# Patient Record
Sex: Female | Born: 1970 | Race: White | Hispanic: No | Marital: Married | Smoking: Former smoker
Health system: Southern US, Community
[De-identification: ages and names within clinical notes are randomized; demographics above are authoritative.]

## PROBLEM LIST (undated history)

## (undated) DIAGNOSIS — Z1371 Encounter for nonprocreative screening for genetic disease carrier status: Secondary | ICD-10-CM

## (undated) DIAGNOSIS — G4730 Sleep apnea, unspecified: Secondary | ICD-10-CM

## (undated) DIAGNOSIS — R51 Headache: Secondary | ICD-10-CM

## (undated) DIAGNOSIS — F329 Major depressive disorder, single episode, unspecified: Secondary | ICD-10-CM

## (undated) DIAGNOSIS — I10 Essential (primary) hypertension: Secondary | ICD-10-CM

## (undated) DIAGNOSIS — F32A Depression, unspecified: Secondary | ICD-10-CM

## (undated) DIAGNOSIS — D0500 Lobular carcinoma in situ of unspecified breast: Secondary | ICD-10-CM

## (undated) DIAGNOSIS — Z9189 Other specified personal risk factors, not elsewhere classified: Secondary | ICD-10-CM

## (undated) DIAGNOSIS — J00 Acute nasopharyngitis [common cold]: Secondary | ICD-10-CM

## (undated) DIAGNOSIS — F419 Anxiety disorder, unspecified: Secondary | ICD-10-CM

## (undated) DIAGNOSIS — N201 Calculus of ureter: Secondary | ICD-10-CM

## (undated) DIAGNOSIS — N200 Calculus of kidney: Secondary | ICD-10-CM

## (undated) DIAGNOSIS — Z87440 Personal history of urinary (tract) infections: Secondary | ICD-10-CM

## (undated) DIAGNOSIS — Z87442 Personal history of urinary calculi: Secondary | ICD-10-CM

## (undated) HISTORY — PX: URETERAL STENT PLACEMENT: SHX822

## (undated) HISTORY — DX: Major depressive disorder, single episode, unspecified: F32.9

## (undated) HISTORY — DX: Personal history of urinary (tract) infections: Z87.440

## (undated) HISTORY — DX: Headache: R51

## (undated) HISTORY — DX: Depression, unspecified: F32.A

## (undated) HISTORY — DX: Lobular carcinoma in situ of unspecified breast: D05.00

## (undated) HISTORY — DX: Calculus of kidney: N20.0

---
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
## 2003-10-08 DIAGNOSIS — Z87442 Personal history of urinary calculi: Secondary | ICD-10-CM | POA: Insufficient documentation

## 2005-03-25 ENCOUNTER — Encounter: Payer: Self-pay | Admitting: Family Medicine

## 2006-04-14 ENCOUNTER — Ambulatory Visit: Payer: Self-pay | Admitting: Family Medicine

## 2006-05-04 ENCOUNTER — Ambulatory Visit: Payer: Self-pay | Admitting: Internal Medicine

## 2006-05-25 ENCOUNTER — Ambulatory Visit: Payer: Self-pay | Admitting: Family Medicine

## 2006-06-02 ENCOUNTER — Ambulatory Visit: Payer: Self-pay | Admitting: Family Medicine

## 2006-08-26 ENCOUNTER — Ambulatory Visit: Payer: Self-pay | Admitting: Family Medicine

## 2007-04-09 ENCOUNTER — Ambulatory Visit: Payer: Self-pay | Admitting: Family Medicine

## 2007-08-26 HISTORY — PX: EXTRACORPOREAL SHOCK WAVE LITHOTRIPSY: SHX1557

## 2007-08-30 ENCOUNTER — Ambulatory Visit: Payer: Self-pay | Admitting: Family Medicine

## 2007-08-30 DIAGNOSIS — R209 Unspecified disturbances of skin sensation: Secondary | ICD-10-CM | POA: Insufficient documentation

## 2007-08-30 DIAGNOSIS — N644 Mastodynia: Secondary | ICD-10-CM | POA: Insufficient documentation

## 2007-08-30 LAB — CONVERTED CEMR LAB: Beta hcg, urine, semiquantitative: NEGATIVE

## 2007-08-31 LAB — CONVERTED CEMR LAB: hCG, Beta Chain, Quant, S: <0.5 milliintl units/mL

## 2007-10-06 ENCOUNTER — Encounter: Payer: Self-pay | Admitting: Family Medicine

## 2007-10-06 DIAGNOSIS — L501 Idiopathic urticaria: Secondary | ICD-10-CM | POA: Insufficient documentation

## 2007-10-06 DIAGNOSIS — G43009 Migraine without aura, not intractable, without status migrainosus: Secondary | ICD-10-CM | POA: Insufficient documentation

## 2007-10-06 DIAGNOSIS — E669 Obesity, unspecified: Secondary | ICD-10-CM | POA: Insufficient documentation

## 2007-10-06 DIAGNOSIS — F411 Generalized anxiety disorder: Secondary | ICD-10-CM | POA: Insufficient documentation

## 2007-10-06 DIAGNOSIS — F17200 Nicotine dependence, unspecified, uncomplicated: Secondary | ICD-10-CM | POA: Insufficient documentation

## 2007-10-06 DIAGNOSIS — IMO0002 Reserved for concepts with insufficient information to code with codable children: Secondary | ICD-10-CM | POA: Insufficient documentation

## 2007-10-06 DIAGNOSIS — F329 Major depressive disorder, single episode, unspecified: Secondary | ICD-10-CM | POA: Insufficient documentation

## 2007-10-06 DIAGNOSIS — F3289 Other specified depressive episodes: Secondary | ICD-10-CM | POA: Insufficient documentation

## 2007-10-11 ENCOUNTER — Ambulatory Visit: Payer: Self-pay | Admitting: Family Medicine

## 2007-10-27 ENCOUNTER — Ambulatory Visit: Payer: Self-pay | Admitting: Internal Medicine

## 2007-10-27 DIAGNOSIS — N390 Urinary tract infection, site not specified: Secondary | ICD-10-CM | POA: Insufficient documentation

## 2007-10-27 LAB — CONVERTED CEMR LAB
Bilirubin Urine: NEGATIVE
Nitrite: NEGATIVE
Protein, U semiquant: 30
Specific Gravity, Urine: >=1.03
pH: 6.5

## 2007-10-28 ENCOUNTER — Encounter: Payer: Self-pay | Admitting: Family Medicine

## 2007-11-05 ENCOUNTER — Ambulatory Visit: Payer: Self-pay | Admitting: Internal Medicine

## 2007-11-05 LAB — CONVERTED CEMR LAB
Bilirubin Urine: NEGATIVE
Nitrite: NEGATIVE
Specific Gravity, Urine: 1.015

## 2007-11-08 ENCOUNTER — Encounter (INDEPENDENT_AMBULATORY_CARE_PROVIDER_SITE_OTHER): Payer: Self-pay | Admitting: *Deleted

## 2007-11-10 ENCOUNTER — Ambulatory Visit: Payer: Self-pay | Admitting: Family Medicine

## 2007-11-10 DIAGNOSIS — N200 Calculus of kidney: Secondary | ICD-10-CM | POA: Insufficient documentation

## 2007-11-10 DIAGNOSIS — M542 Cervicalgia: Secondary | ICD-10-CM | POA: Insufficient documentation

## 2007-11-12 ENCOUNTER — Encounter: Payer: Self-pay | Admitting: Family Medicine

## 2007-11-22 ENCOUNTER — Ambulatory Visit: Payer: Self-pay | Admitting: Urology

## 2007-12-15 ENCOUNTER — Telehealth: Payer: Self-pay | Admitting: Family Medicine

## 2007-12-15 DIAGNOSIS — M25519 Pain in unspecified shoulder: Secondary | ICD-10-CM | POA: Insufficient documentation

## 2007-12-17 ENCOUNTER — Encounter: Payer: Self-pay | Admitting: Family Medicine

## 2007-12-30 ENCOUNTER — Ambulatory Visit: Payer: Self-pay | Admitting: Family Medicine

## 2008-01-31 ENCOUNTER — Ambulatory Visit: Payer: Self-pay | Admitting: Family Medicine

## 2008-06-13 ENCOUNTER — Ambulatory Visit: Payer: Self-pay | Admitting: Family Medicine

## 2008-06-13 DIAGNOSIS — R21 Rash and other nonspecific skin eruption: Secondary | ICD-10-CM | POA: Insufficient documentation

## 2008-10-06 ENCOUNTER — Ambulatory Visit: Payer: Self-pay | Admitting: Family Medicine

## 2008-10-06 DIAGNOSIS — R197 Diarrhea, unspecified: Secondary | ICD-10-CM | POA: Insufficient documentation

## 2008-10-06 DIAGNOSIS — R1084 Generalized abdominal pain: Secondary | ICD-10-CM | POA: Insufficient documentation

## 2008-10-07 ENCOUNTER — Encounter: Payer: Self-pay | Admitting: Family Medicine

## 2008-10-11 ENCOUNTER — Telehealth: Payer: Self-pay | Admitting: Family Medicine

## 2008-10-11 LAB — CONVERTED CEMR LAB
ALT: 17 units/L (ref 0–35)
AST: 16 units/L (ref 0–37)
Albumin: 3.9 g/dL (ref 3.5–5.2)
BUN: 10 mg/dL (ref 6–23)
Basophils Absolute: 0 10*3/uL (ref 0.0–0.1)
Basophils Relative: 0.2 % (ref 0.0–3.0)
CO2: 28 meq/L (ref 19–32)
Calcium: 9.6 mg/dL (ref 8.4–10.5)
Chloride: 106 meq/L (ref 96–112)
Creatinine, Ser: 0.8 mg/dL (ref 0.4–1.2)
Eosinophils Absolute: 0.1 10*3/uL (ref 0.0–0.7)
Eosinophils Relative: 0.8 % (ref 0.0–5.0)
Hemoglobin: 14.6 g/dL (ref 12.0–15.0)
Lymphocytes Relative: 23.5 % (ref 12.0–46.0)
MCHC: 34.4 g/dL (ref 30.0–36.0)
MCV: 90.1 fL (ref 78.0–100.0)
Neutro Abs: 5 10*3/uL (ref 1.4–7.7)
Neutrophils Relative %: 69.6 % (ref 43.0–77.0)
RBC: 4.72 M/uL (ref 3.87–5.11)
TSH: 1.19 microintl units/mL (ref 0.35–5.50)
WBC: 7.2 10*3/uL (ref 4.5–10.5)

## 2009-03-08 ENCOUNTER — Ambulatory Visit: Payer: Self-pay | Admitting: Family Medicine

## 2009-03-08 DIAGNOSIS — M545 Low back pain, unspecified: Secondary | ICD-10-CM | POA: Insufficient documentation

## 2009-03-08 DIAGNOSIS — R310 Gross hematuria: Secondary | ICD-10-CM | POA: Insufficient documentation

## 2009-03-08 LAB — CONVERTED CEMR LAB
Glucose, Urine, Semiquant: NEGATIVE
Nitrite: NEGATIVE
Specific Gravity, Urine: 1.015
WBC Urine, dipstick: NEGATIVE
pH: 6

## 2009-03-09 ENCOUNTER — Encounter: Payer: Self-pay | Admitting: Family Medicine

## 2009-04-26 ENCOUNTER — Ambulatory Visit: Payer: Self-pay | Admitting: Family Medicine

## 2009-04-26 DIAGNOSIS — J40 Bronchitis, not specified as acute or chronic: Secondary | ICD-10-CM | POA: Insufficient documentation

## 2009-05-02 ENCOUNTER — Telehealth (INDEPENDENT_AMBULATORY_CARE_PROVIDER_SITE_OTHER): Payer: Self-pay | Admitting: Internal Medicine

## 2009-05-03 ENCOUNTER — Ambulatory Visit: Payer: Self-pay | Admitting: Family Medicine

## 2009-05-03 ENCOUNTER — Encounter (INDEPENDENT_AMBULATORY_CARE_PROVIDER_SITE_OTHER): Payer: Self-pay | Admitting: Internal Medicine

## 2009-05-03 DIAGNOSIS — R062 Wheezing: Secondary | ICD-10-CM | POA: Insufficient documentation

## 2009-08-23 ENCOUNTER — Ambulatory Visit: Payer: Self-pay | Admitting: Family Medicine

## 2009-08-23 LAB — CONVERTED CEMR LAB
Urobilinogen, UA: 0.2
pH: 5

## 2009-08-24 ENCOUNTER — Emergency Department: Payer: Self-pay | Admitting: Unknown Physician Specialty

## 2009-08-30 ENCOUNTER — Ambulatory Visit: Payer: Self-pay | Admitting: Urology

## 2009-11-15 ENCOUNTER — Ambulatory Visit: Payer: Self-pay | Admitting: Family Medicine

## 2009-11-15 DIAGNOSIS — J069 Acute upper respiratory infection, unspecified: Secondary | ICD-10-CM | POA: Insufficient documentation

## 2010-04-02 ENCOUNTER — Ambulatory Visit: Payer: Self-pay | Admitting: Family Medicine

## 2010-09-06 ENCOUNTER — Ambulatory Visit
Admission: RE | Admit: 2010-09-06 | Discharge: 2010-09-06 | Payer: Self-pay | Source: Home / Self Care | Attending: Family Medicine | Admitting: Family Medicine

## 2010-09-10 ENCOUNTER — Ambulatory Visit: Admit: 2010-09-10 | Payer: Self-pay | Admitting: Family Medicine

## 2010-09-24 NOTE — Assessment & Plan Note (Signed)
Summary: low grade fever/ear and weak/dlo   Vital Signs:  Patient profile:   40 year old female Height:      62.5 inches Weight:      170 pounds BMI:     30.71 Temp:     98.8 degrees F oral Pulse rate:   92 / minute Pulse rhythm:   regular BP sitting:   122 / 78  (left arm) Cuff size:   regular  Vitals Entered By: Delilah Shan CMA Duncan Dull) (November 15, 2009 2:39 PM) CC: Low grade fever, ear, weak   History of Present Illness: 40 yo female here for URI symptoms. Started getting sore throat, runny nose, malaise and ear popping yesterday. Subjective fevers, no chills. No cough. No SOB, no wheezing. Taking sudafed OTC that helps.  Current Medications (verified): 1)  Bcp .... Once Daily  Allergies: 1)  ! Macrobid (Nitrofurantoin Monohyd Macro) 2)  Codeine Phosphate (Codeine Phosphate)  Review of Systems      See HPI General:  Complains of fever; denies chills. ENT:  Complains of nasal congestion, postnasal drainage, and sore throat; denies sinus pressure. CV:  Denies chest pain or discomfort. Resp:  Denies cough, shortness of breath, sputum productive, and wheezing.  Physical Exam  General:  Well-developed,well-nourished,in no acute distress; alert,appropriate and cooperative throughout examination Ears:  clear fluid B TM, nml external ear Nose:  mild injection, no discharge or pallor Mouth:  MMM mild throat injection Lungs:  Normal respiratory effort, chest expands symmetrically. Lungs are clear to auscultation, no crackles or wheezes. Heart:  Normal rate and regular rhythm. S1 and S2 normal without gallop, murmur, click, rub or other extra sounds. Cervical Nodes:  no cervical or supraclavicular lymphadenopathy  Psych:  Cognition and judgment appear intact. Alert and cooperative with normal attention span and concentration. No apparent delusions, illusions, hallucinations   Impression & Recommendations:  Problem # 1:  URI (ICD-465.9) Assessment New Early in course,  likely viral. Continue supportive care with sudafed and Ibuprofen. RTC if no improvement in 7- 10 days.  Complete Medication List: 1)  Bcp  .... Once daily  Current Allergies (reviewed today): ! MACROBID (NITROFURANTOIN MONOHYD MACRO) CODEINE PHOSPHATE (CODEINE PHOSPHATE)

## 2010-09-24 NOTE — Assessment & Plan Note (Signed)
Summary: CONGESTION,FEVER/CLE   Vital Signs:  Patient profile:   40 year old female Height:      62.5 inches Weight:      155.2 pounds BMI:     28.04 Temp:     98.9 degrees F oral Pulse rate:   92 / minute Pulse rhythm:   regular BP sitting:   130 / 90  (left arm) Cuff size:   regular  Vitals Entered By: Benny Lennert CMA Duncan Dull) (April 02, 2010 3:15 PM)  History of Present Illness: Chief complaint fever and congestion  Acute Visit History:      The patient complains of cough, earache, fever, headache, sinus problems, and sore throat.  These symptoms began 5 days ago.  She denies vomiting.  Other comments include: fatigue  nausea using pseudoephdrine.        Her highest temperature has been subjective/chills.        The character of the cough is described as productive.  There is no history of wheezing or shortness of breath associated with her cough.        Earache symptom: pressure B x 73month.        There is no history of recent exposure to strep associated with the sore throat.        She complains of sinus pressure and purulent drainage.             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 Once Daily 2)  Azithromycin 250 Mg Tabs (Azithromycin) .... 2 Tab By Mouth Daily X 1 Then 1 Tab By Mouth Daily  Void After 04/29/2010 Start If Not Improving After 48-72 Hours.  Allergies: 1)  ! Macrobid (Nitrofurantoin Monohyd Macro) 2)  Codeine Phosphate (Codeine Phosphate)  Past History:  Past medical, surgical, family and social histories (including risk factors) reviewed, and no changes noted (except as noted below).  Past Medical History: Reviewed history from 11/05/2007 and no changes required. 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 G1 P1 - C-section Had stent for kidney stone  Family History: Reviewed history from 10/06/2007 and no changes required. Father: Died 57, lung CA Mother: Alive 36, healthy Siblings: 1 brother Ovarian CA:  MGM Lymphoma:  MGF Lung CA:  PGF ETOH:  Uncles  Social History: Reviewed history from 10/06/2007 and no changes required. Current Smoker, 1/2 PPD Alcohol use-yes, seldom Drug use-no Regular exercise-no Marital Status: Married x 2 Children: 1 child (boy) 21 years old Occupation: Runner, broadcasting/film/video, Cabin crew  Ed., [employer] Nutrition:  3 meals, limited fruts and veggies, fast meals, occ. FF  Review of Systems General:  Denies fatigue and fever. CV:  Denies chest pain or discomfort. Resp:  Denies shortness of breath.  Physical Exam  General:  Well-developed,well-nourished,in no acute distress; alert,appropriate and cooperative throughout examination Head:  no maxillary sinus ttp Ears:  clear fluid B TMs Nose:  nasal discharge, no mucosal pallor.   Mouth:  MMM Neck:  no carotid bruit or thyromegaly no  cervical or supraclavicular lymphadenopathy  Lungs:  Normal respiratory effort, chest expands symmetrically. Lungs are clear to auscultation, no crackles or wheezes. Heart:  Normal rate and regular rhythm. S1 and S2 normal without gallop, murmur, click, rub or other extra sounds.   Impression & Recommendations:  Problem # 1:  URI (ICD-465.9)  Treat symptomatically..if not improving given smoking history...fill course of antibiotics.   Call if symptoms persist or worsen.   Complete Medication List: 1)  Bcp  .... Once daily 2)  Azithromycin 250 Mg Tabs (Azithromycin) .... 2 tab by mouth daily x 1 then 1 tab by mouth daily  void after 04/29/2010 start if not improving after 48-72 hours.  Patient Instructions: 1)  Stop decongestant 2)   Start guafenesin (mucinex DM) two times a day. 3)   Nasal saline irrigation 3-4 times a day. 4)   Ibuprofen as needed pain. 5)   If not improving in 48-72 hours may fill antibiotic. Prescriptions: AZITHROMYCIN 250 MG TABS (AZITHROMYCIN) 2 tab by mouth daily x 1 then 1 tab by mouth daily  VOID after 04/29/2010 Start if not improving after 48-72 hours.  #6 x 0   Entered and Authorized by:   Kerby Nora MD   Signed by:   Kerby Nora MD on 04/02/2010   Method used:   Print then Give to Patient   RxID:   [id number]   Current Allergies (reviewed today): ! MACROBID (NITROFURANTOIN MONOHYD MACRO) CODEINE PHOSPHATE (CODEINE PHOSPHATE)

## 2010-09-26 NOTE — Assessment & Plan Note (Signed)
Summary: RIGHT ARM PAIN X 2 WEEKS CYD   Vital Signs:  Patient profile:   40 year old female Height:      62.5 inches Weight:      172.25 pounds BMI:     31.11 Temp:     98.3 degrees F oral Pulse rate:   80 / minute Pulse rhythm:   regular BP sitting:   134 / 84  (left arm) Cuff size:   regular  Vitals Entered By: Delilah Shan CMA Beula Joyner Dull) (September 06, 2010 3:22 PM) CC: Right arm pain x 2 weeks   History of Present Illness: R arm pain.  Occ starts in neck and then radiates down.  Tingling in fingertips.  Going on for 2 weeks.  More sweating under the R arm.  R handed.  No fall, MVA, trauma.  Had been working out at [employer]  It started without any known trigger.  No L sided symptoms.  no FCNAV. no other symptoms.  Ibuprofen didn't help.    Allergies: 1)  ! 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 FF  Review of Systems       See HPI.  Otherwise negative.    Physical Exam  General:  no apparent distress normocephalic atraumatic tm wnl x2  mucous membranes moist op wnl neck supple w/o la or midline pain but R posterior paraspinal muscles at tender to palpation, also upper/lateral R trap is tender to palpation  normal range of motion in neck spurling neg no la in r axilla normal range of motion w/o cuff signs or weakness  on R shoulder distally nv intact.  dtrs wnl.  no decrease in strength in biceps, triceps, or distally in arm.  arm not tender to palpation and no skin changes.    Impression & Recommendations:  Problem # 1:  CERVICALGIA (ICD-723.1)  with radicular symptoms.  D/w patient [id number].  I would not image at this point- no indication for this. GI precaution for steroid burst and use muscle relaxer.  call back if not improved. She agrees.  Anatomy d/w patient.   Orders: Prescription Created Electronically [phone])  Her updated medication list for this problem includes:    Flexeril 10 Mg Tabs (Cyclobenzaprine hcl) .Marland Kitchen... 1/2 to 1 tab by mouth three times a day as needed for pain,  sedation caution.  Complete Medication List: 1)  Bcp  .... Once daily 2)  Prednisone (pak) 5 Mg Tabs (Prednisone) .Marland Kitchen.. 12 day dose pack, take as directed with food. 3)  Flexeril 10 Mg Tabs (Cyclobenzaprine hcl) .... 1/2 to 1 tab by mouth three times a day as needed for pain,  sedation caution.  Patient Instructions: 1)  Start the prednisone and flexeril.  Don't take ibuprofen or aleve with the prednisone.  Flexeril can make you drowsy.  Eat when you takt the prednisone.  Let us know if you aren't getting better.  Take care.  Prescriptions: FLEXERIL 10 MG TABS (CYCLOBENZAPRINE HCL) 1/2 to 1 tab by mouth  three times a day as needed for pain,  sedation caution.  #30 x 0   Entered and Authorized by:   Crawford Givens MD   Signed by:   Crawford Givens MD on 09/06/2010   Method used:   Electronically to        [id number] (retail)       [address]       [place], [place]  [address]       Ph: [phone] or [phone]       Fax: [phone]   RxID:   [id number] PREDNISONE (PAK) 5 MG TABS (PREDNISONE) 12 day dose pack, take as directed with food.  #1 pack x 0   Entered and Authorized by:   Crawford Givens MD   Signed by:   Crawford Givens MD on 09/06/2010   Method used:   Electronically to         [id number] (retail)       [address]       [place], [place]  [address]       Ph: [phone] or [phone]       Fax: [phone]   RxID:   [id number]    Orders Added: 1)  Est. Patient Level III [42706] 2)  Prescription Created Electronically [phone]    Current Allergies (reviewed today): ! MACROBID (NITROFURANTOIN MONOHYD MACRO) CODEINE PHOSPHATE (CODEINE PHOSPHATE)

## 2010-10-24 HISTORY — PX: INTRAUTERINE DEVICE INSERTION: SHX323

## 2011-01-10 NOTE — Assessment & Plan Note (Signed)
[facility]                             [facility] OFFICE NOTE   Ariana Weeks, Ariana Weeks                        MRN:          [id number]  DATE:04/14/2006                            DOB:          1971/07/17    NEW PATIENT HISTORY AND PHYSICAL:   CHIEF COMPLAINT:  [age]-year-old white female here to establish new  doctor.   HISTORY OF PRESENT ILLNESS:  Ariana Weeks states that she has been doing fairly  well over the past few years.  She states that she went through a lot of  stress approximately 2 years ago when she had her new baby and at the same  time, her father died.  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  She states that she has been treated several times in the past by her  gynecologist as well as her previous primary care doctor for depression.  She states that this demonstrates itself mainly with irritability at home  with her husband.  She denies any anhedonia, but does mention a lack of  motivation.  She does have some difficulty falling asleep at night secondary  to thinking about the worries of the day.  She denies any excessive guilt.  She did have 1 suicide attempt about 8-9 years ago, but denies any now.  She  does have decreased energy.  She denies any mania symptoms.  Medications she  has tried in the past that have not worked include Zoloft and several other  unknown medications.   PAST MEDICAL HISTORY:  1. Obesity.  2. Depression.  3. Anxiety.  4. Migraine.  5. Preeclampsia in 2005.  6. Hives, unclear cause.  7. Tobacco abuse.   HOSPITALIZATIONS/SURGERIES/PROCEDURES:  1. G1, P1, C-section.  2. Pap smear, August 2006, was within normal limits.   ALLERGIES:  CODEINE.   MEDICATIONS:  1. Claritin over the counter daily.  2. Ranitidine 150 mg daily.  3. Tri-Sprintec daily.  4. Imitrex 100 mg p.r.n. migraine.   PHYSICIANS:  1. Dr. Tresa Endo,  [facility], for headache.  2. Dr. Tiburcio Pea for Gynecology.   FAMILY HISTORY:  Father deceased at age 19 with lung cancer.  Mother alive  at age 26, healthy.  She has 1 brother who is healthy.  She states she has a  maternal grandmother who had ovarian cancer.  She had some uncles who have  alcoholism, a maternal grandfather with lymphoma and a paternal grandfather  with lung cancer.   SOCIAL HISTORY:  She smokes about a half pack per day, and she also uses  alcohol seldom, about 1 drink every 5-6 months and she has no history of IV  drug use.  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  She has been married for 2 years and has 1 female child,  who is also 49 years old.  She denies any regular exercise.  She eats 3 meals  a day but has limited fruits and vegetables.  She basically just  eats  microwave fast meals from the grocery store and occasional fast food.   REVIEW OF SYSTEMS:  She wears glasses.  She has headaches.  No problems with  hearing.  Occasional dyspnea.  No chest pain and no palpitations.  No  nausea, vomiting, diarrhea, constipation or rectal bleeding.  No  genitourinary frequency.  Her last menstrual period was 28 days ago and was  light.  She has no increased thirst.  She does have some concerns about  being overweight and would like to lose weight, but mainly has a problem  with motivation.   PHYSICAL EXAMINATION:  VITAL SIGNS:  Height 62-1/2 inches, weight 191  pounds, making BMI 37.  Blood pressure 122/80, pulse 80, temperature 98.4.  GENERAL:  Obese-appearing female in no apparent distress.  HEENT:  PERRLA.  Extraocular muscles intact.  Oropharynx clear.  Tympanic  membranes clear.  NECK:  No lymphadenopathy.  No thyromegaly.  CARDIOVASCULAR:  Regular rate and rhythm.  No murmurs, rubs, or gallops.  PULMONARY:  Clear to auscultation bilaterally.  No wheezes, rales or  rhonchi.  ABDOMEN:  Soft and nontender.  Normoactive bowel  sounds.  No  hepatosplenomegaly.  NEUROLOGIC:  Cranial nerves II-XII grossly intact.  Strength 5/5 in upper  and lower extremities.  Reflexes 2+.  PSYCHIATRIC:  Appropriate affect, but the patient does seem somewhat  irritable and frustrated; she denies suicidal or homicidal ideations.   ASSESSMENT AND PLAN:  1. Depression and anxiety, chronic:  We discussed this in detail today and      she feels that trying a different medication at this point would be      helpful.  Given her desire to lose weight, I will begin her on Effexor      XL 37.5 mg daily for 1 week, then go up to 75 mg daily.  She was given      samples as well as a coupon.  I also encouraged her to talk with Dr.      Luiz Blare, our psychologist here, about stress relief and relaxation.  We      can also talk about this at our next visit.  She will return to clinic      in 4-5 weeks to discuss whether the medication has assisted with her      irritability.  2. Obesity:  We discussed healthy eating and how to increase aerobic      exercise.  She states that she knows the things that she needs to do,      but given the fact that she has done Weight[employer]chers before as well gone      to a bariatric clinic and has previously lost weight, but      she is just feeling so unmotivated to it.  She would like to treat her      depression and anxiety first and then work on her weight.                                   Kerby Nora, MD   AB/MedQ  DD:  04/14/2006  DT:  04/15/2006  Job #:  [id number]

## 2011-04-10 ENCOUNTER — Ambulatory Visit (INDEPENDENT_AMBULATORY_CARE_PROVIDER_SITE_OTHER): Payer: BC Managed Care – PPO | Admitting: Family Medicine

## 2011-04-10 ENCOUNTER — Encounter: Payer: Self-pay | Admitting: Family Medicine

## 2011-04-10 DIAGNOSIS — R1084 Generalized abdominal pain: Secondary | ICD-10-CM

## 2011-04-10 MED ORDER — CIPROFLOXACIN HCL 500 MG PO TABS: 500.0000 mg | ORAL_TABLET | Freq: Two times a day (BID) | ORAL | Status: AC

## 2011-04-10 NOTE — Progress Notes (Signed)
  Subjective:    Patient ID: Ariana Weeks, female    DOB: 01/18/1971, 40 y.o.   MRN: [id number]  HPI Pt of Dr Daphine Deutscher here as acute appt for "her stomach killing her" since yesterday about 530PM and hasn't slept due to feeeling like she needs to have her thighs against her abd to help the pain. She had supper and this AM had Pop Tart. Last night night had chicken sandwich from [employer]. She had Roast Beef sandwich and pudding for lunch. She had brfst with Pop Tart yesterday AM with no pain at that time. She left school last night about 530PM  And the pain started then. She has had some nausea, no vomitting. She has had mild chill last night, no fever and no chills otherwise. 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 BMs are orangish brown that are loose, last one last night at 1AM with effort, another again at 530, none since. She does not feel constipated. She feels like she needs to throw up. She does not usually vomit. She says lots of kids have complained of abd pain but doesn't think any have been diagnosed even with AGE. Her abd usually is fairly normal. She was told in the past she could have irritable bowel. She has had appetite but none right now. Again she feels like if she could vomit things would improve. She is not nauseated.    Review of Systems  Gastrointestinal: Negative for vomiting, constipation, blood in stool, abdominal distention, anal bleeding and rectal pain.  Genitourinary: Negative for dysuria, frequency, hematuria, flank pain and difficulty urinating.       Has had kidney stone in the past.       Objective:   Physical Exam  Constitutional: She appears well-developed and well-nourished. No distress.  HENT:  Head: Normocephalic and atraumatic.  Right Ear: External ear normal.  Left Ear: External ear normal.  Nose: Nose normal.  Mouth/Throat: Oropharynx is clear and moist. No oropharyngeal exudate.  Eyes:  Conjunctivae and EOM are normal. Pupils are equal, round, and reactive to light.  Neck: Normal range of motion. Neck supple. No thyromegaly present.  Cardiovascular: Normal rate, regular rhythm and normal heart sounds.   Pulmonary/Chest: Effort normal and breath sounds normal. She has no wheezes. She has no rales.  Abdominal: Soft. Bowel sounds are normal. She exhibits no distension and no mass. There is tenderness (LLQ>RLQ). There is no rebound and no guarding.  Lymphadenopathy:    She has no cervical adenopathy.  Skin: She is not diaphoretic.          Assessment & Plan:

## 2011-04-10 NOTE — Patient Instructions (Signed)
Call tomorrow with report of situation, esp if sxs worsen.

## 2011-04-10 NOTE — Assessment & Plan Note (Signed)
Pain in LLQ>RLQ with loose stool and nausea in waves. Presume she has AGE and will try Cipro 500mg  bid for one week. Call if sxs worsen. If tries to make herself throw up, hold Cipro until after this event, then take if sxs continue or can't induce vomiting.

## 2012-02-16 ENCOUNTER — Telehealth: Payer: Self-pay | Admitting: Family Medicine

## 2012-02-16 NOTE — Telephone Encounter (Signed)
Patient advised.

## 2012-02-16 NOTE — Telephone Encounter (Signed)
aller: Tifini/Mother; PCP: Kerby Nora E.; CB#: [phone]; ; ; Call regarding Blurry Vision and Severe Headaches;  Pt called for an appt and was transferred to the nurse. Pt has had frequent h/a's. Pt was referred to a neurologist 3 years ago - there were no results/pt has not been back. Pt is calling because this past w/e they were at the beach and the pt developed a severe h/a on Thursday  which was severe - she has maintained a h/a since then. This am it is very painful. Pt has taken Naproxen 500mg  this am at 9am;  Ibuprofen 800mg  at 07:00 this am . Pt states she has a h/a 4 out of 7 days that is her normal baseline. but over the past 2 weeks they have worsened. Pt is having blurry vision intermittently this am. This gives an ED disposition. OFFICE NOTE SENT TO SEE IF OFFICE WANTS TO SEND PT TO ED OR HAVE HER COME TO OFFICE. PLEASE CALL PT BACK.

## 2012-02-16 NOTE — Telephone Encounter (Signed)
In a patient with migraine, can see Dr. Leonard Schwartz tomorrow - will set up appt. Acute on chronic migraine

## 2012-02-16 NOTE — Telephone Encounter (Signed)
excedrin migraine reasonable to try  Sometimes coffee/caffeine will help, too

## 2012-02-16 NOTE — Telephone Encounter (Signed)
Patient says that she can not some in any other days this week. Wants to know if there is anything else she can try over the counter?

## 2012-05-04 ENCOUNTER — Encounter: Payer: Self-pay | Admitting: Family Medicine

## 2012-05-04 ENCOUNTER — Ambulatory Visit (INDEPENDENT_AMBULATORY_CARE_PROVIDER_SITE_OTHER)
Admission: RE | Admit: 2012-05-04 | Discharge: 2012-05-04 | Disposition: A | Discharge status: Home / Self Care | Payer: BC Managed Care – PPO | Source: Ambulatory Visit | Attending: Family Medicine | Admitting: Family Medicine

## 2012-05-04 ENCOUNTER — Telehealth: Payer: Self-pay | Admitting: Family Medicine

## 2012-05-04 ENCOUNTER — Ambulatory Visit (INDEPENDENT_AMBULATORY_CARE_PROVIDER_SITE_OTHER): Payer: BC Managed Care – PPO | Admitting: Family Medicine

## 2012-05-04 VITALS — BP 130/82 | HR 109 | Temp 98.6°F | Resp 17 | Wt 173.5 lb

## 2012-05-04 DIAGNOSIS — M545 Low back pain, unspecified: Secondary | ICD-10-CM

## 2012-05-04 LAB — POCT URINALYSIS DIPSTICK
Glucose, UA: NEGATIVE
Nitrite, UA: NEGATIVE
Urobilinogen, UA: 0.2

## 2012-05-04 MED ORDER — TAMSULOSIN HCL 0.4 MG PO CAPS: 0.4000 mg | ORAL_CAPSULE | Freq: Every day | ORAL | Status: DC

## 2012-05-04 MED ORDER — HYDROCODONE-ACETAMINOPHEN 5-500 MG PO TABS: 1.0000 | ORAL_TABLET | Freq: Three times a day (TID) | ORAL | Status: AC | PRN

## 2012-05-04 NOTE — Telephone Encounter (Signed)
Notify pt: Left hydroureternephrosis due to obstruction by 3.4 mm stone in the mid left ureter. Pain was more on right side  But stone appears to be obstructing.  She should be able to pass this on her own, she can let urologist know the results.. Strain urine, push fluids, start flomax and we can call in vicodin for pain.  If pain not improving in next few days.. Go to urologist to be seen.

## 2012-05-04 NOTE — Assessment & Plan Note (Signed)
Along with hematuria. Concern for stones. Will send for  CT non contrast to eval.  if no stones... Possibly MSK strain vs constipation.

## 2012-05-04 NOTE — Telephone Encounter (Signed)
Pt notified. Would like Vicodin and Flomax called in. Thanks.

## 2012-05-04 NOTE — Patient Instructions (Addendum)
Stop at front desk to schedule CT abd and pelvis. Try heat, gentle stretching and ibuprofen 800 mg three times a day for pain.

## 2012-05-04 NOTE — Progress Notes (Signed)
  Subjective:    Patient ID: Ariana Weeks, female    DOB: [birth year], 41 y.o.   MRN: [id number]  HPI  41 year old female presents with  Right low back pain radiates to right anterior abdomen x 5 days. She tried to call her uro but they cannot see her for months.   Saw GYN for low back pain.. On UA there, blood in urine: Started on cipro x 7 days. Feels similar to kidney stones she has had in the past. No dysuria.  She is on day 6 with no improvement of symptoms, Tramadol not helping with pain Some N, no V.  Some abdominal bloating.. No BM in last 4 days.  She does have mirena placed in /2013.  No low back injury. No weakness in leg numbness.  She has history of nephrolithiasis multiple times, hx of needing lithotripsy. Last kidney stones 2-3 years ago. Had some stones that had to be left.   Review of Systems  Constitutional: Negative for fever and fatigue.  HENT: Negative for ear pain.   Eyes: Negative for pain.  Respiratory: Negative for chest tightness and shortness of breath.   Cardiovascular: Negative for chest pain, palpitations and leg swelling.  Gastrointestinal: Negative for abdominal pain.  Genitourinary: Negative for dysuria.       Objective:   Physical Exam  Constitutional: She appears well-developed and well-nourished.  Eyes: Conjunctivae are normal. Pupils are equal, round, and reactive to light.  Neck: Normal range of motion. Neck supple. No thyromegaly present.  Cardiovascular: Normal rate and regular rhythm.   No murmur heard. Pulmonary/Chest: Effort normal and breath sounds normal.  Abdominal: Soft. Bowel sounds are normal. She exhibits no distension and no mass. There is tenderness. There is no rebound and no guarding.       No CVA tenderness, but pain in right low back  No vertebral ttp, neg SLR.          Assessment & Plan:

## 2012-05-06 NOTE — Telephone Encounter (Signed)
Pt called status of vicodin; Lisa at [facility] said did not receive call in for Vicodin. I gave Vicodin rx to [place] and pt notified.

## 2012-05-07 ENCOUNTER — Telehealth: Payer: Self-pay | Admitting: Family Medicine

## 2012-05-07 DIAGNOSIS — N200 Calculus of kidney: Secondary | ICD-10-CM

## 2012-05-07 NOTE — Telephone Encounter (Signed)
Pt has a kidney stone and still has not passed it. She was wondering if she could get a referral to a urologist for this ??

## 2012-05-18 ENCOUNTER — Other Ambulatory Visit: Payer: Self-pay | Admitting: Urology

## 2012-05-19 ENCOUNTER — Telehealth: Payer: Self-pay | Admitting: Family Medicine

## 2012-05-19 NOTE — Telephone Encounter (Signed)
Caller: [name]/Patient; Patient Name: Ariana Weeks; PCP: Kerby Nora [facility]); Best Callback Phone Number: [phone]; Reason for call: Cough/Congestion.  Onset: 05/18/12.  Afebrile.  Reports nasal congestion with green nasal mucus and green productive cough.  Also generalized headache with pain rated 5 out of 10. Pain relieved 05/18/12 with use of Ibuprofen.  Scheduled for urethroscopy to remove kidney stones 05/26/2012.  LMP 8/end/13.  Birth control/Mirena. May use Guaifenesin prn;hydrate/humidify to loosen mucus. Advised to see MD within 24 hours for productive cough with colored sputum per Upper Respiratory Infection guideline. Declined appointment now; will try otc meds and comfort measures suggested and call if symptoms worsen.

## 2012-05-25 ENCOUNTER — Encounter (HOSPITAL_BASED_OUTPATIENT_CLINIC_OR_DEPARTMENT_OTHER): Payer: Self-pay | Admitting: *Deleted

## 2012-05-25 NOTE — Progress Notes (Signed)
NPO AFTER MN. ARRIVES AT 0715. NEEDS HG AND URINE PREG. MAY TAKE VICODINE IF NEEDED W/ SIPS OF WATER AM OF SURG.

## 2012-05-26 ENCOUNTER — Encounter (HOSPITAL_BASED_OUTPATIENT_CLINIC_OR_DEPARTMENT_OTHER)
Admission: RE | Disposition: A | Discharge status: Home / Self Care | Payer: Self-pay | Source: Ambulatory Visit | Attending: Urology

## 2012-05-26 ENCOUNTER — Encounter (HOSPITAL_BASED_OUTPATIENT_CLINIC_OR_DEPARTMENT_OTHER): Payer: Self-pay | Admitting: *Deleted

## 2012-05-26 ENCOUNTER — Ambulatory Visit (HOSPITAL_BASED_OUTPATIENT_CLINIC_OR_DEPARTMENT_OTHER)
Admission: RE | Admit: 2012-05-26 | Discharge: 2012-05-26 | Disposition: A | Discharge status: Home / Self Care | Payer: BC Managed Care – PPO | Source: Ambulatory Visit | Attending: Urology | Admitting: Urology

## 2012-05-26 ENCOUNTER — Ambulatory Visit (HOSPITAL_BASED_OUTPATIENT_CLINIC_OR_DEPARTMENT_OTHER): Payer: BC Managed Care – PPO | Admitting: Anesthesiology

## 2012-05-26 ENCOUNTER — Encounter (HOSPITAL_BASED_OUTPATIENT_CLINIC_OR_DEPARTMENT_OTHER): Payer: Self-pay | Admitting: Anesthesiology

## 2012-05-26 DIAGNOSIS — M545 Low back pain, unspecified: Secondary | ICD-10-CM | POA: Insufficient documentation

## 2012-05-26 DIAGNOSIS — R1032 Left lower quadrant pain: Secondary | ICD-10-CM | POA: Insufficient documentation

## 2012-05-26 HISTORY — DX: Headache: R51

## 2012-05-26 HISTORY — DX: Calculus of ureter: N20.1

## 2012-05-26 HISTORY — DX: Personal history of urinary calculi: Z87.442

## 2012-05-26 HISTORY — DX: Acute nasopharyngitis (common cold): J00

## 2012-05-26 LAB — POCT PREGNANCY, URINE: Preg Test, Ur: NEGATIVE

## 2012-05-26 LAB — POCT HEMOGLOBIN-HEMACUE: Hemoglobin: 14.1 g/dL (ref 12.0–15.0)

## 2012-05-26 SURGERY — CYSTOURETEROSCOPY, WITH RETROGRADE PYELOGRAM AND STENT INSERTION
Anesthesia: General | Site: Ureter | Laterality: Left | Wound class: Clean Contaminated

## 2012-05-26 MED ORDER — CEFAZOLIN SODIUM-DEXTROSE 2-3 GM-% IV SOLR
INTRAVENOUS | Status: DC | PRN
  Administered 2012-05-26: 2 g via INTRAVENOUS

## 2012-05-26 MED ORDER — SODIUM CHLORIDE 0.9 % IR SOLN
Status: DC | PRN
  Administered 2012-05-26: 6000 mL via INTRAVESICAL

## 2012-05-26 MED ORDER — LIDOCAINE HCL (CARDIAC) 20 MG/ML IV SOLN
INTRAVENOUS | Status: DC | PRN
  Administered 2012-05-26: 100 mg via INTRAVENOUS

## 2012-05-26 MED ORDER — FENTANYL CITRATE 0.05 MG/ML IJ SOLN
INTRAMUSCULAR | Status: DC | PRN
  Administered 2012-05-26: 25 ug via INTRAVENOUS
  Administered 2012-05-26: 100 ug via INTRAVENOUS
  Administered 2012-05-26: 50 ug via INTRAVENOUS
  Administered 2012-05-26: 25 ug via INTRAVENOUS

## 2012-05-26 MED ORDER — MEPERIDINE HCL 25 MG/ML IJ SOLN: 6.2500 mg | INTRAMUSCULAR | Status: DC | PRN

## 2012-05-26 MED ORDER — CEFAZOLIN SODIUM-DEXTROSE 2-3 GM-% IV SOLR: 2.0000 g | INTRAVENOUS | Status: DC

## 2012-05-26 MED ORDER — ONDANSETRON HCL 4 MG/2ML IJ SOLN
INTRAMUSCULAR | Status: DC | PRN
  Administered 2012-05-26: 4 mg via INTRAVENOUS

## 2012-05-26 MED ORDER — DEXAMETHASONE SODIUM PHOSPHATE 4 MG/ML IJ SOLN
INTRAMUSCULAR | Status: DC | PRN
  Administered 2012-05-26: 10 mg via INTRAVENOUS

## 2012-05-26 MED ORDER — HYDROMORPHONE HCL PF 1 MG/ML IJ SOLN: 0.2500 mg | INTRAMUSCULAR | Status: DC | PRN

## 2012-05-26 MED ORDER — LACTATED RINGERS IV SOLN
INTRAVENOUS | Status: DC
  Administered 2012-05-26: 08:00:00 via INTRAVENOUS

## 2012-05-26 MED ORDER — ACETAMINOPHEN 10 MG/ML IV SOLN: 1000.0000 mg | Freq: Once | INTRAVENOUS | Status: DC | PRN

## 2012-05-26 MED ORDER — PROPOFOL 10 MG/ML IV BOLUS
INTRAVENOUS | Status: DC | PRN
  Administered 2012-05-26: 200 mg via INTRAVENOUS

## 2012-05-26 MED ORDER — OXYCODONE HCL 5 MG/5ML PO SOLN: 5.0000 mg | Freq: Once | ORAL | Status: DC | PRN

## 2012-05-26 MED ORDER — LIDOCAINE HCL 2 % EX GEL
CUTANEOUS | Status: DC | PRN
  Administered 2012-05-26: 1

## 2012-05-26 MED ORDER — OXYCODONE HCL 5 MG PO TABS: 5.0000 mg | ORAL_TABLET | Freq: Once | ORAL | Status: DC | PRN

## 2012-05-26 MED ORDER — IOHEXOL 350 MG/ML SOLN
INTRAVENOUS | Status: DC | PRN
  Administered 2012-05-26: 2 mL via INTRAVENOUS

## 2012-05-26 MED ORDER — MIDAZOLAM HCL 5 MG/5ML IJ SOLN
INTRAMUSCULAR | Status: DC | PRN
  Administered 2012-05-26: 2 mg via INTRAVENOUS

## 2012-05-26 MED ORDER — CEFAZOLIN SODIUM 1-5 GM-% IV SOLN: 1.0000 g | INTRAVENOUS | Status: DC

## 2012-05-26 MED ORDER — LACTATED RINGERS IV SOLN
INTRAVENOUS | Status: DC | PRN
  Administered 2012-05-26: 08:00:00 via INTRAVENOUS

## 2012-05-26 MED ORDER — KETOROLAC TROMETHAMINE 30 MG/ML IJ SOLN
INTRAMUSCULAR | Status: DC | PRN
  Administered 2012-05-26: 30 mg via INTRAVENOUS

## 2012-05-26 MED ORDER — PROMETHAZINE HCL 25 MG/ML IJ SOLN: 6.2500 mg | INTRAMUSCULAR | Status: DC | PRN

## 2012-05-26 SURGICAL SUPPLY — 37 items
ADAPTER CATH URET PLST 4-6FR (CATHETERS) IMPLANT
ADPR CATH URET STRL DISP 4-6FR (CATHETERS)
APL SKNCLS STERI-STRIP NONHPOA (GAUZE/BANDAGES/DRESSINGS)
BAG DRAIN URO-CYSTO SKYTR STRL (DRAIN) ×3 IMPLANT
BAG DRN UROCATH (DRAIN) ×2
BASKET LASER NITINOL 1.9FR (BASKET) IMPLANT
BASKET STNLS GEMINI 4WIRE 3FR (BASKET) IMPLANT
BASKET ZERO TIP NITINOL 2.4FR (BASKET) ×3 IMPLANT
BENZOIN TINCTURE PRP APPL 2/3 (GAUZE/BANDAGES/DRESSINGS) IMPLANT
BSKT STON RTRVL 120 1.9FR (BASKET)
BSKT STON RTRVL GEM 120X11 3FR (BASKET)
BSKT STON RTRVL ZERO TP 2.4FR (BASKET) ×2
CANISTER SUCT LVC 12 LTR MEDI- (MISCELLANEOUS) ×2 IMPLANT
CATH INTERMIT  6FR 70CM (CATHETERS) IMPLANT
CATH URET 5FR 28IN CONE TIP (BALLOONS)
CATH URET 5FR 28IN OPEN ENDED (CATHETERS) IMPLANT
CATH URET 5FR 70CM CONE TIP (BALLOONS) IMPLANT
CLOTH BEACON ORANGE TIMEOUT ST (SAFETY) ×3 IMPLANT
DRAPE CAMERA CLOSED 9X96 (DRAPES) ×3 IMPLANT
DRSG TEGADERM 2-3/8X2-3/4 SM (GAUZE/BANDAGES/DRESSINGS) IMPLANT
GLOVE BIO SURGEON STRL SZ7.5 (GLOVE) ×3 IMPLANT
GLOVE INDICATOR 6.5 STRL GRN (GLOVE) ×4 IMPLANT
GOWN STRL REIN XL XLG (GOWN DISPOSABLE) ×3 IMPLANT
GUIDEWIRE 0.038 PTFE COATED (WIRE) IMPLANT
GUIDEWIRE ANG ZIPWIRE 038X150 (WIRE) IMPLANT
GUIDEWIRE STR DUAL SENSOR (WIRE) IMPLANT
IV NS IRRIG 3000ML ARTHROMATIC (IV SOLUTION) ×6 IMPLANT
KIT BALLIN UROMAX 15FX10 (LABEL) IMPLANT
KIT BALLN UROMAX 15FX4 (MISCELLANEOUS) IMPLANT
KIT BALLN UROMAX 26 75X4 (MISCELLANEOUS)
LASER FIBER DISP (UROLOGICAL SUPPLIES) IMPLANT
LASER FIBER DISP 1000U (UROLOGICAL SUPPLIES) IMPLANT
NS IRRIG 500ML POUR BTL (IV SOLUTION) IMPLANT
PACK CYSTOSCOPY (CUSTOM PROCEDURE TRAY) ×3 IMPLANT
SET HIGH PRES BAL DIL (LABEL)
SHEATH ACCESS URETERAL 38CM (SHEATH) IMPLANT
SHEATH ACCESS URETERAL 54CM (SHEATH) IMPLANT

## 2012-05-26 NOTE — H&P (Signed)
Reason For Visit  Acute evaluation of LLQ abdominal and flank pain   History of Present Illness     41 y/o white female with known left midureteral calculus presents for acute evaluation of left flank pain into the LLQ abdomen.  She was seen in the office 05/11/12 with c/o right low back pain.  She had a recent CTU which showed a left midureteral stone as well as multiple bilateral renal calculi.  Her stone was not felt to be contributing to her right low back pain at that time.  She had a KUB 05/11/12 which confirmed presence of previously noted left ureteral stone.  She has been taking Flomax qhs for MET for the past 2-3 weeks.  She has not been straining her urine but has not seen an obvious stone pass.  She developed LLQ abdominal and left flank pain Wed. 05/12/12 which has persisted intermittently.  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  The vicodin gives mild pain relief.  She denies gross hematuria, dysuria, fever or chills.  She does report associated increased frequency and urgency with frequent, small volume voids.   Past Medical History Problems  1. History of  Depression 311  Surgical History Problems  1. History of  Cesarean Section  Current Meds 1. Flomax 0.4 MG Oral Capsule; Therapy: (Recorded:17Sep2013) to 2. Hydrocodone-Acetaminophen TABS; Therapy: (Recorded:17Sep2013) to  Allergies Medication  1. No Known Drug Allergies  Family History Problems  1. Paternal history of  Death In The Family Father 80yrs, Lung ca 2. Maternal history of  Family Health Status - Mother's Age 75yrs 3. Family history of  Family Health Status Number Of Children 1 son 4. Paternal history of  Lung Cancer V16.1  Social History Problems  1. Being A Social Drinker 2. Caffeine Use 2 qd 3. Former Smoker V15.82 1/2 ppd for 67yrs, non smoker for the past 26yrs 4. Marital History - Currently Married 5. Occupation: Runner, broadcasting/film/video  Review  of Systems Genitourinary, constitutional, skin, eye, otolaryngeal, hematologic/lymphatic, cardiovascular, pulmonary, endocrine, musculoskeletal, gastrointestinal, neurological and psychiatric system(s) were reviewed and pertinent findings if present are noted.  Genitourinary: urinary frequency and urinary urgency.  Gastrointestinal: flank pain and abdominal pain.    Vitals Vital Signs [Data Includes: Last 1 Day]  24Sep2013 01:29PM  Blood Pressure: 146 / 84 Temperature: 97.9 F Heart Rate: 114  Physical Exam Constitutional: Well nourished and well developed . No acute distress.  ENT:. The ears and nose are normal in appearance.  Neck: The appearance of the neck is normal and no neck mass is present.  Pulmonary: No respiratory distress and normal respiratory rhythm and effort.  Cardiovascular:. No peripheral edema.  Abdomen: No CVA tenderness.  Skin: Normal skin turgor, no visible rash and no visible skin lesions.  Neuro/Psych:. Mood and affect are appropriate.    Results/Data  The following images/tracing/specimen were independently visualized: Marland Kitchen     KUB demonstrates a faint calcification along the expected course of the left mid-distal ureter projected over the transverse process of L5 which is unchanged in position as compared to CTU 05/04/12.  The calcification was not visualized on KUB 05/11/12.  There are bilateral renal calculi as well.  She has an IUD in place.  The following clinical lab reports were reviewed: Marland Kitchen  Urinalysis Selected Results  UA With REFLEX 24Sep2013 01:06PM Bruning, Ashlyn   Test Name Result Flag Reference  COLOR YELLOW  YELLOW  APPEARANCE CLEAR  CLEAR  SPECIFIC GRAVITY 1.010  1.005-1.030  pH 5.5  5.0-8.0  GLUCOSE NEG mg/dL  NEG  BILIRUBIN NEG  NEG  KETONE NEG mg/dL  NEG  BLOOD SMALL A NEG  PROTEIN NEG mg/dL  NEG  UROBILINOGEN 0.2 mg/dL  1.6-1.0  NITRITE NEG  NEG  LEUKOCYTE ESTERASE NEG  NEG  SQUAMOUS EPITHELIAL/HPF RARE  RARE  WBC 0-2 WBC/hpf  <3  RBC  NONE SEEN RBC/hpf  <3  BACTERIA RARE  RARE  CRYSTALS NONE SEEN  NONE SEEN  CASTS NONE SEEN  NONE SEEN   Assessment Assessed  1. Ureteral Stone 592.1 2. Diffuse Abdominal Pain Left 789.07   KUB today shows persistence of left mid-distal ureteral stone.  Pt is having increased pain and requests to proceed with definitive stone treatment.  The stone is not easily visualized on KUB, therefore ESWL is not ideal.  We discussed cystoureteroscopy for stone extraction with possible need for DJ stent placement.  Risks and benefits discussed.  Pt elects to proceed.  I have reviewed the pts case and imaging with Dr. Isabel Caprice who agrees with stated assessment and plan.   Plan  Health Maintenance (V70.0)  1. UA With REFLEX  Done: 24Sep2013 01:06PM     Orders completed for cystoscopy with left ureteroscopy, RGP, stone extraction and possible placement of DJ stent. She will continue Flomax and will increase to 2 capsules po qhs for MET.  Strain urine consistently. Dilaudid prn pain. She knows to call promptly for uncontrolled pain, N/V, fever or chills. KUB  Status: Resulted - Requires Verification  Done: 01Jan0001 12:00AM Ordered Today; For: Diffuse Abdominal Pain (789.07); Ordered By: Marcello Fennel  Due: 26Sep2013 Marked Important; Last Updated By: Dorcas Mcmurray   Signatures Electronically signed by : Marcello Fennel, PA-C; May 20 2012  5:54PM

## 2012-05-26 NOTE — Anesthesia Preprocedure Evaluation (Signed)
Anesthesia Evaluation  Patient identified by MRN, date of birth, ID band Patient awake    Reviewed: Allergy & Precautions, H&P , NPO status , Patient's Chart, lab work & pertinent test results  Airway Mallampati: II TM Distance: >3 FB Neck ROM: Full    Dental  (+) Dental Advisory Given   Pulmonary neg pulmonary ROS,  breath sounds clear to auscultation  Pulmonary exam normal       Cardiovascular - CAD, - Past MI and - CHF Rhythm:Regular Rate:Normal     Neuro/Psych  Headaches, PSYCHIATRIC DISORDERS Anxiety Depression negative neurological ROS     GI/Hepatic negative GI ROS,   Endo/Other  negative endocrine ROS  Renal/GU Renal disease     Musculoskeletal negative musculoskeletal ROS (+)   Abdominal   Peds  Hematology negative hematology ROS (+)   Anesthesia Other Findings   Reproductive/Obstetrics                           Anesthesia Physical Anesthesia Plan  ASA: II  Anesthesia Plan: General   Post-op Pain Management:    Induction: Intravenous  Airway Management Planned: LMA  Additional Equipment:   Intra-op Plan:   Post-operative Plan: Extubation in OR  Informed Consent: I have reviewed the patients History and Physical, chart, labs and discussed the procedure including the risks, benefits and alternatives for the proposed anesthesia with the patient or authorized representative who has indicated his/her understanding and acceptance.   Dental advisory given  Plan Discussed with: CRNA and Surgeon  Anesthesia Plan Comments:         Anesthesia Quick Evaluation

## 2012-05-26 NOTE — Anesthesia Procedure Notes (Signed)
Procedure Name: LMA Insertion Date/Time: 05/26/2012 8:38 AM Performed by: Jessica Priest Pre-anesthesia Checklist: Patient identified, Emergency Drugs available, Suction available and Patient being monitored Patient Re-evaluated:Patient Re-evaluated prior to inductionOxygen Delivery Method: Circle System Utilized Preoxygenation: Pre-oxygenation with 100% oxygen Intubation Type: IV induction Ventilation: Mask ventilation without difficulty LMA: LMA inserted LMA Size: 4.0 Number of attempts: 1 Airway Equipment and Method: bite block Placement Confirmation: positive ETCO2 Tube secured with: Tape Dental Injury: Teeth and Oropharynx as per pre-operative assessment

## 2012-05-26 NOTE — Anesthesia Postprocedure Evaluation (Signed)
Anesthesia Post Note  Patient: Ariana Weeks  Procedure(s) Performed: Procedure(s) (LRB): CYSTOSCOPY WITH RETROGRADE PYELOGRAM, URETEROSCOPY AND STENT PLACEMENT (Left)  Anesthesia type: General  Patient location: PACU  Post pain: Pain level controlled  Post assessment: Post-op Vital signs reviewed  Last Vitals: BP 128/84  Pulse 61  Temp 36.2 C (Oral)  Resp 16  Ht 5\' 2"  (1.575 m)  Wt 169 lb (76.658 kg)  BMI 30.91 kg/m2  SpO2 100%  LMP 04/20/2012  Post vital signs: Reviewed  Level of consciousness: sedated  Complications: No apparent anesthesia complications

## 2012-05-26 NOTE — Transfer of Care (Signed)
Immediate Anesthesia Transfer of Care Note  Patient: Ariana Weeks  Procedure(s) Performed: Procedure(s) (LRB): CYSTOSCOPY WITH RETROGRADE PYELOGRAM, URETEROSCOPY AND STENT PLACEMENT (Left)  Patient Location: PACU  Anesthesia Type: General  Level of Consciousness: awake, sedated, patient cooperative and responds to stimulation  Airway & Oxygen Therapy: Patient Spontanous Breathing and Patient connected to face mask oxygen  Post-op Assessment: Report given to PACU RN, Post -op Vital signs reviewed and stable and Patient moving all extremities  Post vital signs: Reviewed and stable  Complications: No apparent anesthesia complications

## 2012-05-26 NOTE — Op Note (Signed)
Preoperative diagnosis: Left ureteral calculus Postoperative diagnosis: No stone found  Procedure: Cystoscopy, left retrograde pyelogram with fluoroscopic interpretation, left ureteroscopy   Surgeon: Valetta Fuller M.D.  Anesthesia: Gen.  Indications: Patient was seen in our office with complaints of right low back discomfort. The patient had a CT scan which actually showed a 4 mm left midureteral calculus that was asymptomatic at that time. Her right-sided back pain was felt to be musculoskeletal. The patient subsequently developed left renal colic and abdominal pain and was reevaluated in our office. She requested intervention. She continued to have discomfort but has not had much pain in the last 3-4 days. She tells me she has been straining her urine and has not seen an obvious stone.     Technique and findings: Patient was brought the operating room where she had successful induction of general anesthesia. She was placed in lithotomy position prepped and draped in usual manner. Appropriate surgical timeout was performed. Cystoscopy revealed unremarkable bladder. There was some slight edema in the urothelium on the left hemitrigone. On fluoroscopy there were a couple of calcifications in the left hemipelvis bit difficult to determine if those were ureteral calculi. Retrograde pyelogram was done with fluoroscopic interpretation. Several centimeters above the left ureteral orifice there was a slight narrowing of the ureter but a definitive filling defect cannot be appreciated. A guidewire was placed through this area without difficulty. We did elect to engage the distal left ureter with a rigid ureteroscope. This was easily done without the need for dilation and I can appreciate no evidence of a stone up to the proximal ureter. There was some slight mucosal urethane the at the junction of the distal and mid ureter but again no stone was appreciated. We did not feel that double-J stent was required. The  patient was brought to recovery room in stable condition.

## 2012-06-24 ENCOUNTER — Encounter: Payer: Self-pay | Admitting: Family Medicine

## 2012-06-24 ENCOUNTER — Ambulatory Visit (INDEPENDENT_AMBULATORY_CARE_PROVIDER_SITE_OTHER): Payer: BC Managed Care – PPO | Admitting: Family Medicine

## 2012-06-24 VITALS — BP 156/94 | HR 96 | Temp 98.6°F | Wt 173.5 lb

## 2012-06-24 DIAGNOSIS — Z720 Tobacco use: Secondary | ICD-10-CM

## 2012-06-24 DIAGNOSIS — J209 Acute bronchitis, unspecified: Secondary | ICD-10-CM

## 2012-06-24 DIAGNOSIS — IMO0001 Reserved for inherently not codable concepts without codable children: Secondary | ICD-10-CM

## 2012-06-24 DIAGNOSIS — R030 Elevated blood-pressure reading, without diagnosis of hypertension: Secondary | ICD-10-CM

## 2012-06-24 DIAGNOSIS — F17200 Nicotine dependence, unspecified, uncomplicated: Secondary | ICD-10-CM

## 2012-06-24 MED ORDER — AZITHROMYCIN 250 MG PO TABS: ORAL_TABLET | ORAL | Status: DC

## 2012-06-24 NOTE — Progress Notes (Signed)
[facility] [address] [place] [address] Phone: [phone] Fax: [phone]  Date:  06/24/2012   Name:  Ariana Weeks   DOB:  [birth year]   MRN:  [id number] Gender: female Age: 41 y.o.  PCP:  Kerby Nora, MD  Evaluating MD: Hannah Beat, MD   Chief Complaint: Sore Throat   History of Present Illness:  Ariana Weeks is a 41 y.o. pleasant patient who presents with the following:  Throat and coughing in her chest. 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 Had strep - 2 years ago. Green mucous, kids will not cover mouth.   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 At this point is progressed and is getting worse and she has a deep cough and ducted cough. She is a tobacco abuser and smokes about half a pack a day. She has decreased this since she has been sick.  Elevated blood pressure, 156/94. She has had some elevated blood pressures before, but she does have some recent intake of cold medication, but that was approaching 10 hours ago.  Patient Active Problem List  Diagnosis  . OBESITY  . ANXIETY  . TOBACCO ABUSE  . DEPRESSION  . MIGRAINE, COMMON  . NEPHROLITHIASIS  . PRE-ECLAMPSIA  . IDIOPATHIC URTICARIA  . SHOULDER PAIN, RIGHT  . CERVICALGIA  . LOW BACK PAIN, ACUTE  . DIARRHEA, CHRONIC  . ABDOMINAL PAIN, GENERALIZED  . RENAL CALCULUS, HX OF    Past Medical History  Diagnosis Date  . Headache   . Left ureteral calculus   . History of kidney stones   . Head cold     Past Surgical History  Procedure Date  . Cesarean section 2005  . Ureteral stent placement 1998  (APPROX)    Kidney Stone  . Extracorporeal shock wave lithotripsy 2009  . Intrauterine device insertion MARCH 2012    MURINA    History  Substance Use Topics  . Smoking status: Current Every Day Smoker -- 0.5 packs/day for 16 years    Types: Cigarettes  .  Smokeless tobacco: Never Used  . Alcohol Use: Yes     RARE    Family History  Problem Relation Age of Onset  . Cancer Father     Lung  . Alcohol abuse Maternal Uncle   . Alcohol abuse Paternal Uncle   . Cancer Maternal Grandmother     Ovarian  . Cancer Maternal Grandfather     Lymphoma  . Cancer Paternal Grandfather     Lung    Allergies  Allergen Reactions  . Nitrofurantoin Hives    Medication list has been reviewed and updated.  Outpatient Prescriptions Prior to Visit  Medication Sig Dispense Refill  . dextromethorphan-guaiFENesin (MUCINEX DM) 30-600 MG per 12 hr tablet Take 1 tablet by mouth every 12 (twelve) hours.      . Hydrocodone-Acetaminophen (VICODIN PO) Take by mouth as needed.      . Tamsulosin HCl (FLOMAX) 0.4 MG CAPS Take 1 capsule (0.4 mg total) by mouth daily.  30 capsule  3    Review of Systems:  ROS: GEN: Acute illness details above GI: Tolerating PO intake GU: maintaining adequate hydration and urination Pulm: No SOB Interactive and getting along well at home.  Otherwise, ROS is as per the HPI.   Physical Examination: Filed Vitals:   06/24/12 1548  BP: 156/94  Pulse:  96  Temp: 98.6 F (37 C)  TempSrc: Oral  Weight: 173 lb 8 oz (78.699 kg)  SpO2: 97%    There is no height on file to calculate BMI. Ideal Body Weight:     GEN: A and O x 3. WDWN. NAD.    ENT: Nose clear, ext NML.  No LAD.  No JVD.  TM's clear. Oropharynx clear.  PULM: Normal WOB, no distress. Scattered rare wheezes with occ rhonchorous sounds.  CV: RRR, no M/G/R, No rubs, No JVD.   EXT: warm and well-perfused, No c/c/e. PSYCH: Pleasant and conversant.   Assessment and Plan:  1. Bronchitis, acute   2. Tobacco abuse   3. Elevated blood pressure    Worsening bronchitis in face of tobacco abuse Zpak, otc cold meds, rest  One she is better, recheck her blood pressure every one to 2 weeks while she is at the store. If it is successfully above 140/90, then recall  our office.  Orders Today:  No orders of the defined types were placed in this encounter.    Updated Medication List: (Includes new medications, updates to list, dose adjustments) Meds ordered this encounter  Medications  . azithromycin (ZITHROMAX) 250 MG tablet    Sig: 2 tabs po on day 1, then 1 tab po for 4 days    Dispense:  6 tablet    Refill:  0    Medications Discontinued: Medications Discontinued During This Encounter  Medication Reason  . Tamsulosin HCl (FLOMAX) 0.4 MG CAPS Completed Course  . dextromethorphan-guaiFENesin (MUCINEX DM) 30-600 MG per 12 hr tablet Patient Preference  . Hydrocodone-Acetaminophen (VICODIN PO) Patient has not taken in last 30 days     Hannah Beat, MD

## 2012-08-20 ENCOUNTER — Encounter: Payer: Self-pay | Admitting: Family Medicine

## 2012-08-20 ENCOUNTER — Ambulatory Visit (INDEPENDENT_AMBULATORY_CARE_PROVIDER_SITE_OTHER): Payer: BC Managed Care – PPO | Admitting: Family Medicine

## 2012-08-20 VITALS — BP 140/84 | HR 93 | Temp 98.2°F | Ht 62.0 in | Wt 180.8 lb

## 2012-08-20 DIAGNOSIS — M5416 Radiculopathy, lumbar region: Secondary | ICD-10-CM

## 2012-08-20 DIAGNOSIS — IMO0002 Reserved for concepts with insufficient information to code with codable children: Secondary | ICD-10-CM

## 2012-08-20 DIAGNOSIS — M5412 Radiculopathy, cervical region: Secondary | ICD-10-CM

## 2012-08-20 MED ORDER — PREDNISONE 20 MG PO TABS: ORAL_TABLET | ORAL | Status: DC

## 2012-08-20 MED ORDER — HYDROCODONE-ACETAMINOPHEN 5-325 MG PO TABS: 1.0000 | ORAL_TABLET | Freq: Four times a day (QID) | ORAL | Status: DC | PRN

## 2012-08-20 MED ORDER — TIZANIDINE HCL 4 MG PO TABS: 4.0000 mg | ORAL_TABLET | Freq: Every evening | ORAL | Status: DC

## 2012-08-20 NOTE — Progress Notes (Signed)
Nature conservation officer at [facility] [address] [place] [address] Phone: [phone] Fax: [phone]  Date:  08/20/2012   Name:  Ariana Weeks   DOB:  [birth year]   MRN:  [id number] Gender: female Age: 41 y.o.  PCP:  Kerby Nora, MD  Evaluating MD: Hannah Beat, MD   Chief Complaint: Arm Pain and Leg Pain   History of Present Illness:  Ariana Weeks is a 41 y.o. pleasant patient who presents with the following:  Patient with a history of cervical spine disc herniation and history of epidural steroid injection x1 in the past approximately 3 or 4 years ago presents with some acute sided upper arm pain, shoulder blade pain, and some numbness and tingling down the left upper extremity as well as numbness and tingling in the first and second digits. No known injury or trauma. Difficulty sleeping. Significant pain in the left upper extremity. She is able to make it worse with turning and moving her head herself.  Left thumb numb, also pain in the lateral aspect of arm and some shooting pain in the posterior of neck. Cannot sleep, pulls and cannot.   Left sided shooting pain in left lower leg.   ESI in neck in the past.   Today, she also developed some left-sided lower extremity radiculopathy. No bowel or bladder incontinence.  Patient Active Problem List  Diagnosis  . OBESITY  . ANXIETY  . TOBACCO ABUSE  . DEPRESSION  . MIGRAINE, COMMON  . NEPHROLITHIASIS  . PRE-ECLAMPSIA  . IDIOPATHIC URTICARIA  . SHOULDER PAIN, RIGHT  . CERVICALGIA  . LOW BACK PAIN, ACUTE  . DIARRHEA, CHRONIC  . ABDOMINAL PAIN, GENERALIZED  . RENAL CALCULUS, HX OF    Past Medical History  Diagnosis Date  . Headache   . Left ureteral calculus   . History of kidney stones   . Head cold     Past Surgical History  Procedure Date  . Cesarean section 2005  . Ureteral stent placement 1998  (APPROX)    Kidney Stone  . Extracorporeal shock wave lithotripsy 2009  . Intrauterine device  insertion MARCH 2012    MURINA    History  Substance Use Topics  . Smoking status: Current Every Day Smoker -- 0.5 packs/day for 16 years    Types: Cigarettes  . Smokeless tobacco: Never Used  . Alcohol Use: Yes     Comment: RARE    Family History  Problem Relation Age of Onset  . Cancer Father     Lung  . Alcohol abuse Maternal Uncle   . Alcohol abuse Paternal Uncle   . Cancer Maternal Grandmother     Ovarian  . Cancer Maternal Grandfather     Lymphoma  . Cancer Paternal Grandfather     Lung    Allergies  Allergen Reactions  . Nitrofurantoin Hives    Medication list has been reviewed and updated.  Outpatient Prescriptions Prior to Visit  Medication Sig Dispense Refill  . [DISCONTINUED] azithromycin (ZITHROMAX) 250 MG tablet 2 tabs po on day 1, then 1 tab po for 4 days  6 tablet  0   Last reviewed on 06/24/2012  3:54 PM by Shon Millet, CMA  Review of Systems:   GEN: No fevers, chills. Nontoxic. Primarily MSK c/o today. MSK: Detailed in the HPI GI: tolerating PO intake without difficulty Neuro: detailed above Otherwise the pertinent positives of the ROS are noted above.    Physical Examination: BP 140/84  Pulse 93  Temp 98.2 F (36.8 C) (Oral)  Ht 5\' 2"  (1.575 m)  Wt 180 lb 12 oz (81.988 kg)  BMI 33.06 kg/m2  SpO2 97%  Ideal Body Weight: Weight in (lb) to have BMI = 25: 136.4    GEN: Well-developed,well-nourished,in no acute distress; alert,appropriate and cooperative throughout examination HEENT: Normocephalic and atraumatic without obvious abnormalities. Ears, externally no deformities PULM: Breathing comfortably in no respiratory distress EXT: No clubbing, cyanosis, or edema PSYCH: Normally interactive. Cooperative during the interview. Pleasant. Friendly and conversant. Not anxious or depressed appearing. Normal, full affect.  CERVICAL SPINE EXAM Range of motion: Flexion, extension, lateral bending, and rotation: Approximate 20% loss of  motion in all directions. The patient does have a provokable Spurling's maneuver. Pain with terminal motion: yes Spinous Processes: NT SCM: NT Upper paracervical muscles: ttp Upper traps: NT C5-T1 intact, sensation and motor, ? Mild decrease at 1st and 2nd digit sensation  Range of motion at  the waist: Flexion: normal Extension: normal Lateral bending: normal Rotation: all normal  No echymosis or edema Rises to examination table with no difficulty Gait: non antalgic  Inspection/Deformity: N Paraspinus Tenderness: no  B Ankle Dorsiflexion (L5,4): 5/5 B Great Toe Dorsiflexion (L5,4): 5/5 Heel Walk (L5): WNL Toe Walk (S1): WNL Rise/Squat (L4): WNL  SENSORY B Medial Foot (L4): WNL B Dorsum (L5): WNL B Lateral (S1): WNL Light Touch: WNL  REFLEXES Knee (L4): 2+ Ankle (S1): 2+  B SLR, seated: neg B SLR, supine: neg B FABER: neg B Reverse FABER: neg B Greater Troch: NT B Log Roll: neg B Stork: NT B Sciatic Notch: NT   Assessment and Plan:  1. Cervical radiculopathy   2. Lumbar radiculopathy, acute    Classic cervical radiculopathy. This would be unrelated to the lumbar radiculopathy, which curiously has began today.  14 day prednisone. Zanaflex. Vicodin when necessary. Reviewed McKenzie style rehabilitation. Recheck in 3 or 4 weeks.  Orders Today:  No orders of the defined types were placed in this encounter.    Updated Medication List: (Includes new medications, updates to list, dose adjustments) Meds ordered this encounter  Medications  . predniSONE (DELTASONE) 20 MG tablet    Sig: 2 tabs po for 7 days, then 1 tab po for 7 days    Dispense:  21 tablet    Refill:  0  . tiZANidine (ZANAFLEX) 4 MG tablet    Sig: Take 1 tablet (4 mg total) by mouth Nightly.    Dispense:  30 tablet    Refill:  2  . HYDROcodone-acetaminophen (NORCO/VICODIN) 5-325 MG per tablet    Sig: Take 1 tablet by mouth every 6 (six) hours as needed for pain.    Dispense:  40 tablet      Refill:  0    Medications Discontinued: Medications Discontinued During This Encounter  Medication Reason  . azithromycin (ZITHROMAX) 250 MG tablet Error     Hannah Beat, MD

## 2012-08-20 NOTE — Patient Instructions (Addendum)
F/u 3-4 weeks

## 2012-09-15 ENCOUNTER — Ambulatory Visit: Payer: BC Managed Care – PPO | Admitting: Family Medicine

## 2012-09-25 HISTORY — PX: CHOLECYSTECTOMY: SHX55

## 2012-09-27 ENCOUNTER — Ambulatory Visit: Payer: Self-pay | Admitting: Family Medicine

## 2012-10-11 ENCOUNTER — Inpatient Hospital Stay: Payer: Self-pay | Admitting: Surgery

## 2012-10-11 LAB — CBC WITH DIFFERENTIAL/PLATELET
Basophil #: 0 10*3/uL (ref 0.0–0.1)
Basophil %: 0.3 %
HCT: 42.9 % (ref 35.0–47.0)
Lymphocyte %: 20.9 %
MCHC: 34.1 g/dL (ref 32.0–36.0)
MCV: 90 fL (ref 80–100)
Neutrophil #: 7.1 10*3/uL — ABNORMAL HIGH (ref 1.4–6.5)
Neutrophil %: 70.4 %
Platelet: 218 10*3/uL (ref 150–440)
RBC: 4.74 10*6/uL (ref 3.80–5.20)
RDW: 13.4 % (ref 11.5–14.5)

## 2012-10-11 LAB — COMPREHENSIVE METABOLIC PANEL
Bilirubin,Total: 0.4 mg/dL (ref 0.2–1.0)
Chloride: 109 mmol/L — ABNORMAL HIGH (ref 98–107)
Creatinine: 0.79 mg/dL (ref 0.60–1.30)
Glucose: 85 mg/dL (ref 65–99)
Osmolality: 283 (ref 275–301)
SGOT(AST): 19 U/L (ref 15–37)
SGPT (ALT): 21 U/L (ref 12–78)
Sodium: 142 mmol/L (ref 136–145)

## 2012-10-12 LAB — PREGNANCY, URINE: Pregnancy Test, Urine: NEGATIVE m[IU]/mL

## 2012-10-13 LAB — COMPREHENSIVE METABOLIC PANEL
Albumin: 3.2 g/dL — ABNORMAL LOW (ref 3.4–5.0)
BUN: 8 mg/dL (ref 7–18)
Calcium, Total: 8.6 mg/dL (ref 8.5–10.1)
Co2: 25 mmol/L (ref 21–32)
SGOT(AST): 40 U/L — ABNORMAL HIGH (ref 15–37)
SGPT (ALT): 38 U/L (ref 12–78)

## 2012-10-13 LAB — CBC WITH DIFFERENTIAL/PLATELET
Eosinophil %: 0.2 %
Lymphocyte #: 1.5 10*3/uL (ref 1.0–3.6)
Lymphocyte %: 12.6 %
Monocyte %: 8.6 %
Neutrophil %: 78.3 %
Platelet: 172 10*3/uL (ref 150–440)
WBC: 11.6 10*3/uL — ABNORMAL HIGH (ref 3.6–11.0)

## 2012-10-13 LAB — LIPASE, BLOOD: Lipase: 73 U/L (ref 73–393)

## 2012-10-25 LAB — HM MAMMOGRAPHY: HM Mammogram: NEGATIVE

## 2012-12-15 ENCOUNTER — Ambulatory Visit: Payer: Self-pay | Admitting: Family Medicine

## 2013-01-06 ENCOUNTER — Other Ambulatory Visit: Payer: Self-pay

## 2013-01-06 ENCOUNTER — Ambulatory Visit (INDEPENDENT_AMBULATORY_CARE_PROVIDER_SITE_OTHER): Payer: BC Managed Care – PPO | Admitting: General Surgery

## 2013-01-06 ENCOUNTER — Encounter: Payer: Self-pay | Admitting: General Surgery

## 2013-01-06 VITALS — BP 152/84 | HR 74 | Resp 16 | Ht 62.0 in | Wt 184.0 lb

## 2013-01-06 DIAGNOSIS — N63 Unspecified lump in unspecified breast: Secondary | ICD-10-CM

## 2013-01-06 DIAGNOSIS — R928 Other abnormal and inconclusive findings on diagnostic imaging of breast: Secondary | ICD-10-CM

## 2013-01-06 NOTE — Patient Instructions (Addendum)
Continue self breast exams. Call office for any new breast issues or concerns.  The breast biopsy procedure was reviewed with the patient. The potential for bleeding, infection, and pain was reviewed. At this time, the benefits outweigh the risk and the patient is amenable to proceed after June.  Patient has been scheduled for an ultrasound guided right breast biopsy at [facility] for 02-02-13 at 1:00 pm (arrive 12:30 pm-registration desk). This patient has been instructed not to take ibuprofen for 6 days prior. It is okay to take tylenol. She verbalizes understanding.

## 2013-01-06 NOTE — Progress Notes (Signed)
Patient ID: Ariana Weeks, female   DOB: 04-17-1971, 42 y.o.   MRN: [id number]  Chief Complaint  Patient presents with  . Breast Problem    abnormal mammogram    HPI Ariana Weeks is a 42 y.o. female.  Patient here today for abnormal mammogram (first mammogram) referred by Dr Dear.  States no breast trauma.  Family history of breast cancer includes a mother with DCIS age late 69's, paternal aunt with breast cancer in her 30's.  She states that she "can't feel anything".  No breast issues.  The patient had a screening mammogram in 2009. This is the first study since that time.  The patient is accompanied by her mother who was present for the interview and exam. HPI  Past Medical History  Diagnosis Date  . Headache   . Left ureteral calculus   . History of kidney stones   . Head cold   . Kidney stones     Past Surgical History  Procedure Laterality Date  . Cesarean section  2005  . Ureteral stent placement  1998  (APPROX)    Kidney Stone  . Extracorporeal shock wave lithotripsy  2009  . Intrauterine device insertion  MARCH 2012    Ariana Weeks  . Cholecystectomy  09/2012    Family History  Problem Relation Age of Onset  . Cancer Father     Lung  . Alcohol abuse Maternal Uncle   . Alcohol abuse Paternal Uncle   . Cancer Maternal Grandmother     Ovarian  . Cancer Maternal Grandfather     Lymphoma  . Cancer Maternal Aunt 43    great aunt breast late 55's  . Cancer Mother 41    breast age late 29's    Social History History  Substance Use Topics  . Smoking status: Current Every Day Smoker -- 0.50 packs/day for 16 years    Types: Cigarettes  . Smokeless tobacco: Never Used  . Alcohol Use: Yes     Comment: RARE    Allergies  Allergen Reactions  . Nitrofurantoin Hives    Macrobid    Current Outpatient Prescriptions  Medication Sig Dispense Refill  . ibuprofen (ADVIL,MOTRIN) 800 MG tablet Take 800 mg by mouth every 8 (eight) hours as needed for pain.      Marland Kitchen  zolpidem (AMBIEN) 5 MG tablet Take by mouth as needed.       No current facility-administered medications for this visit.    Review of Systems Review of Systems  Constitutional: Negative.   Respiratory: Negative.   Cardiovascular: Negative.     Blood pressure 152/84, pulse 74, resp. rate 16, height 5\' 2"  (1.575 m), weight 184 lb (83.462 kg).  Physical Exam Physical Exam  Constitutional: She is oriented to person, place, and time. She appears well-developed and well-nourished.  Cardiovascular: Normal rate and regular rhythm.   Pulmonary/Chest: Effort normal and breath sounds normal. Right breast exhibits no inverted nipple, no mass, no nipple discharge, no skin change and no tenderness. Left breast exhibits no inverted nipple, no mass, no nipple discharge, no skin change and no tenderness.  Neurological: She is alert and oriented to person, place, and time.  Skin: Skin is warm and dry.    Data Reviewed The patient had undergone original screening mammograms at [facility]. These were not available for review.  Right breast spot compression views and ultrasound dated 12/15/2012 were available for review.  Smoothly marginated nodules were noted in the upper-outer quadrant of the right  breast.  Ultrasound examination showed 2 lesions, first at the 9:00 position measuring 0.4 cm in diameter thought to represent a benign lymph node.  The index lesion of concern was at the 10:00 position 7 cm from the nipple where an irregularly bordered nodule with central vascularity was identified. BI-RAD-4.  Ultrasound examination of the upper-outer quadrant of the right breast was completed. In spite of careful imaging I could not identify the area 10:00 which requires biopsy to  Assessment    Abnormal mammogram right breast.    Plan    Arrangements will be made for an ultrasound-guided biopsy at [facility]. She will be contacted when pathology is available.     Patient has been scheduled  for an ultrasound guided right breast biopsy at [facility] for 02-02-13 at 1:00 pm (arrive 12:30 pm-registration desk). This patient has been instructed not to take ibuprofen for 6 days prior. It is okay to take tylenol. She verbalizes understanding.   Please note: Date is per patient's request to accommodate work schedule.  Earline Mayotte 01/06/2013, 9:10 PM

## 2013-02-02 ENCOUNTER — Ambulatory Visit: Payer: Self-pay | Admitting: General Surgery

## 2013-02-02 HISTORY — PX: BREAST BIOPSY: SHX20

## 2013-02-03 ENCOUNTER — Telehealth: Payer: Self-pay | Admitting: *Deleted

## 2013-02-03 ENCOUNTER — Encounter: Payer: Self-pay | Admitting: General Surgery

## 2013-02-03 NOTE — Progress Notes (Signed)
Quick Note:  Mammographic abnormality was a lymph node. Patient will be notified of results. Resume annual screening mammograms with primary care provider. ______

## 2013-02-03 NOTE — Telephone Encounter (Signed)
Message left that biopsy results were fine.

## 2013-06-24 IMAGING — US US  BREAST BX W/ LOC DEV 1ST LESION IMG BX SPEC US GUIDE*R*
1 series · 13 of 17 positions shown · non-contrast
Comparison: none

RESULT:      Comparison: Right breast ultrasound mammogram 12/15/2012

Consent: After the procedure and its risks including infection, bleeding and
allergy were discussed with the patient and all questions answered, informed
consent was signed.
Procedure: The patient was placed supine on the ultrasound with the right
arm in ABER position. A "timeout" was performed to verify the correct
patient and correct procedure. Initial ultrasound of the right breast
demonstrated a hypoechoic mass at [DATE] which correlated with the prior
ultrasound. The patient was then prepped and draped in the usual sterile
fashion. Realtime ultrasound was used to localize the mass and a biopsy path
was selected. The skin was marked and 1% lidocaine was infiltrated into skin
and soft tissues along the biopsy tract up to the mass for local anesthesia.
A small skin incision with a #11 blade was made at the skin entry site. A
15gauge coaxial needle was advanced to the margin of the mass. Subsequently,
6 core biopsy specimens were obtained using an Achieve 18 gauge needle in
automatic mode. Needle passes into the mass were observed under realtime and
recorded. The needle was removed, hemostasis achieved and a band-aid applied
to the skin incision. The patient tolerated the procedure well without
immediate complication and was discharged in good condition after 15 min
observation.
Postbiopsy cc and true lateral mammograms demonstrate the clip marker in the
superolateral right breast just posterior lateral to the region of
previously demonstrated round mass.

[Series 1: us breast bx w/ loc dev 1st lesion img bx spec us  · 0.08mm/px · 13 of 17 slices shown]
[im 1/17]
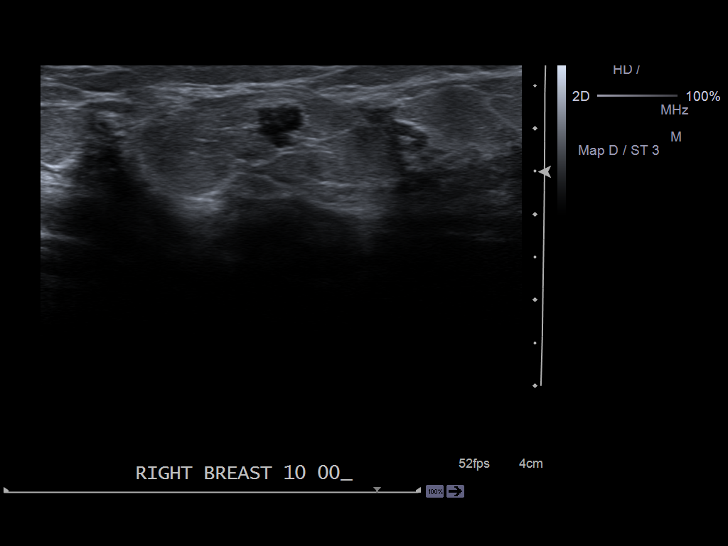
[im 2/17]
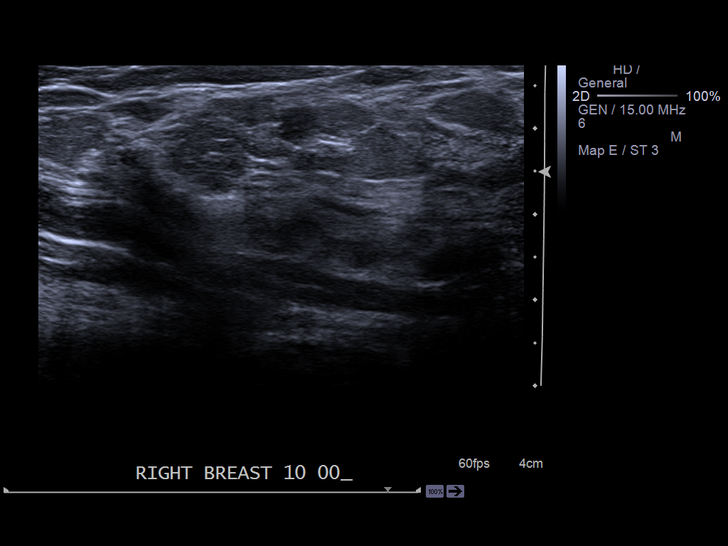
[im 4/17]
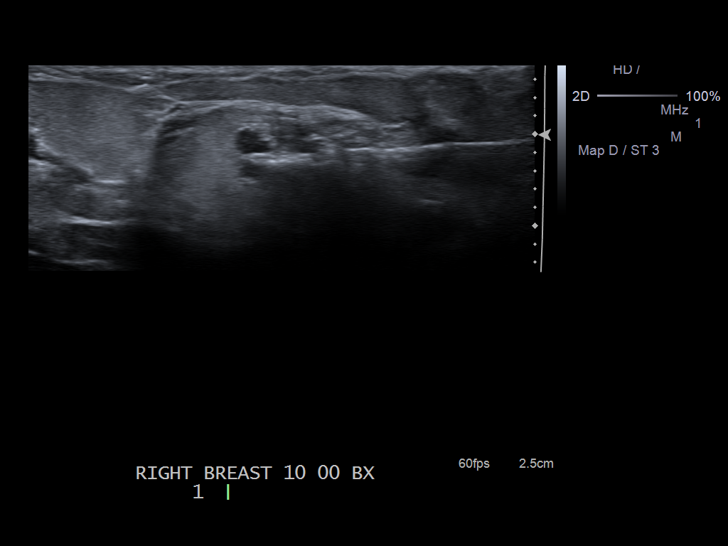
[im 5/17]
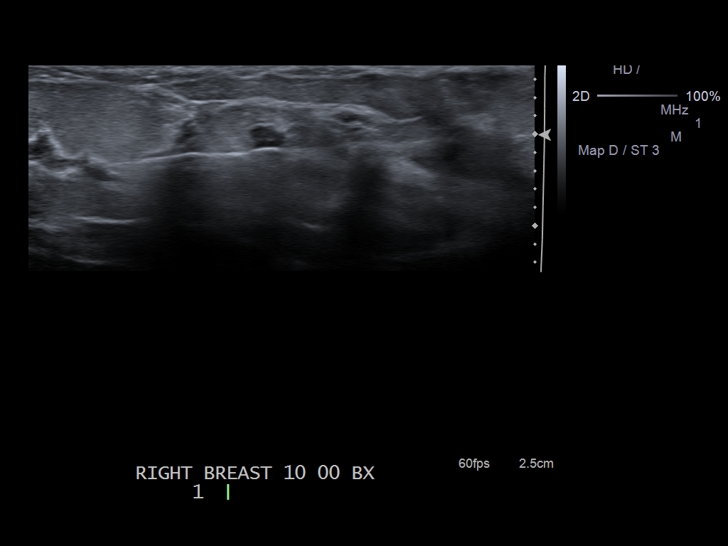
[im 6/17]
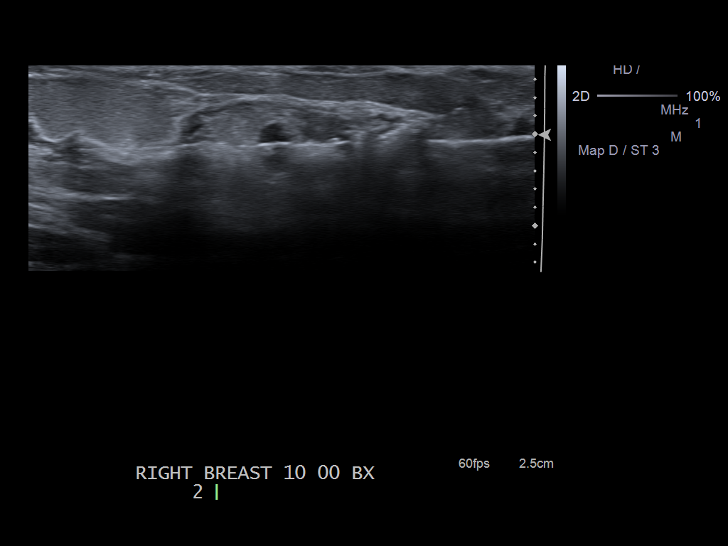
[im 8/17]
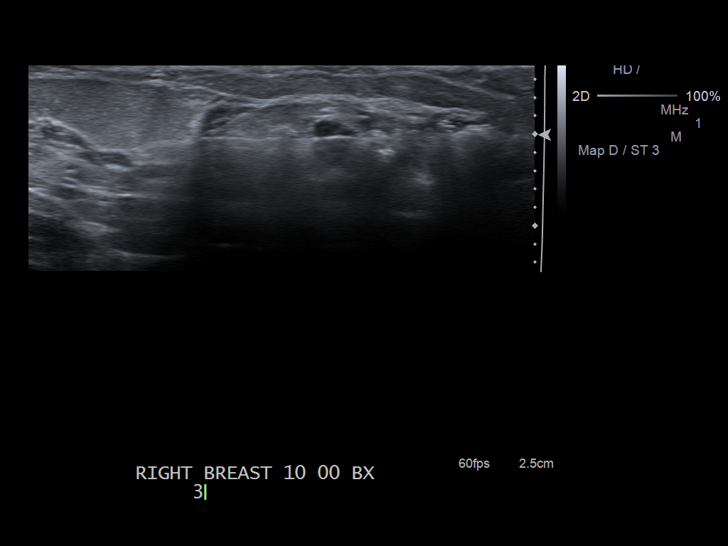
[im 9/17]
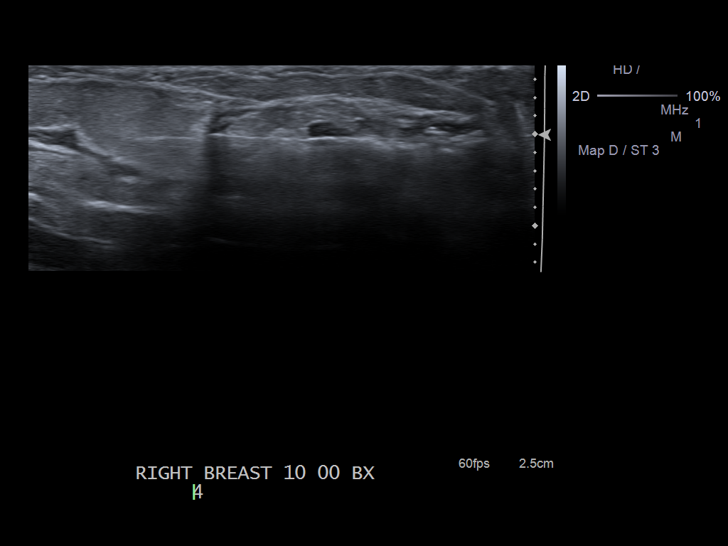
[im 10/17]
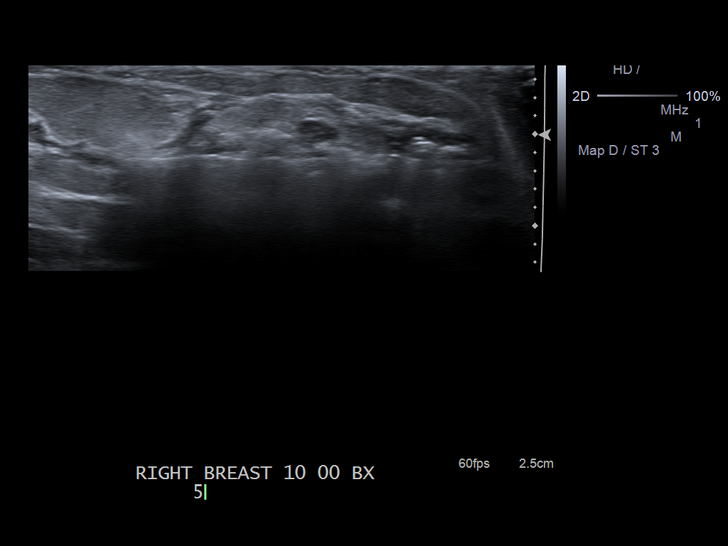
[im 12/17]
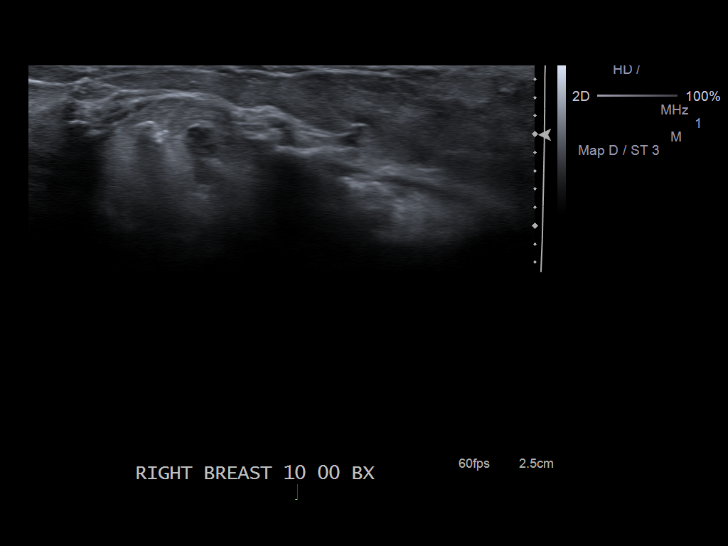
[im 13/17]
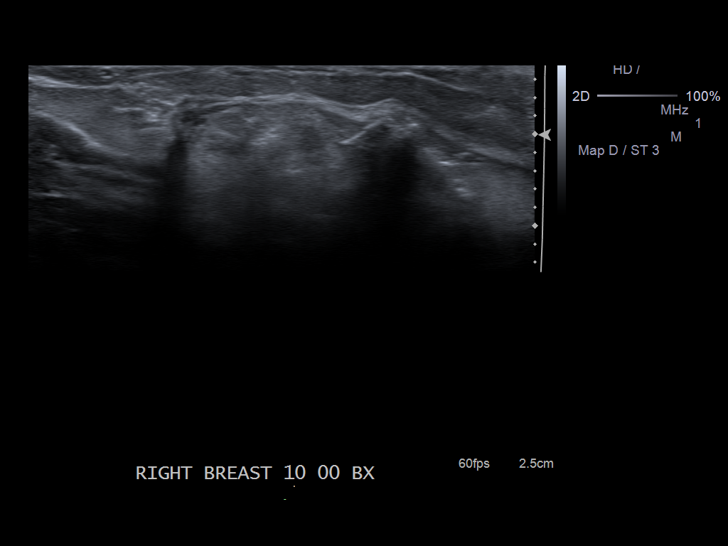
[im 14/17]
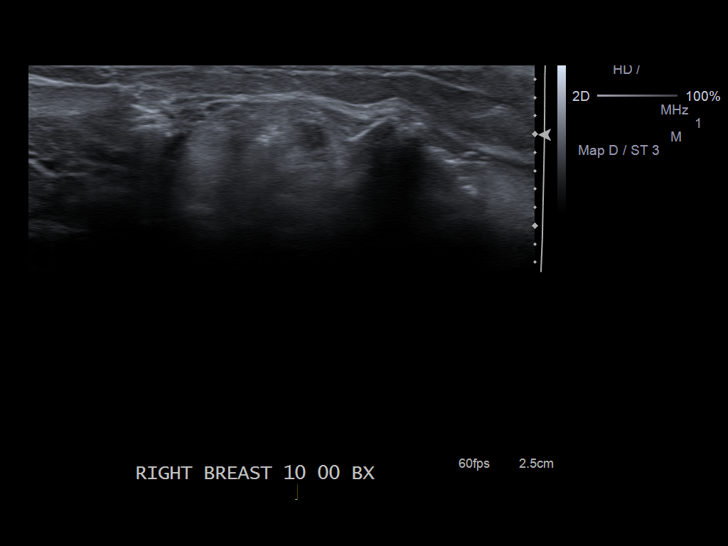
[im 16/17]
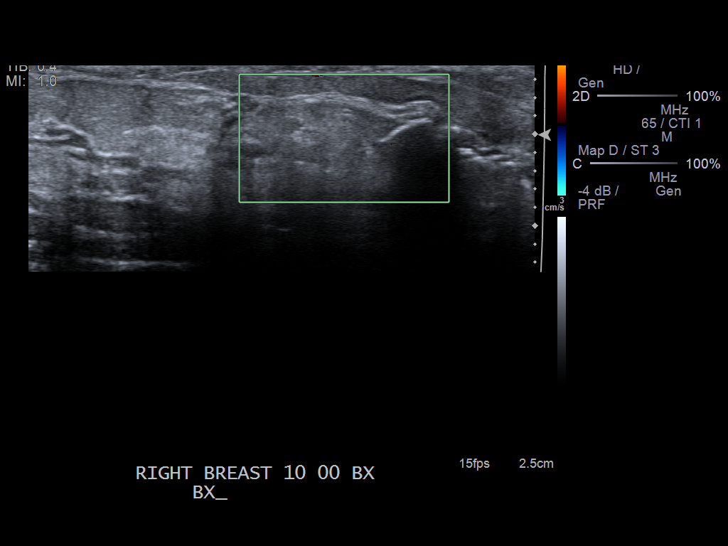
[im 17/17]
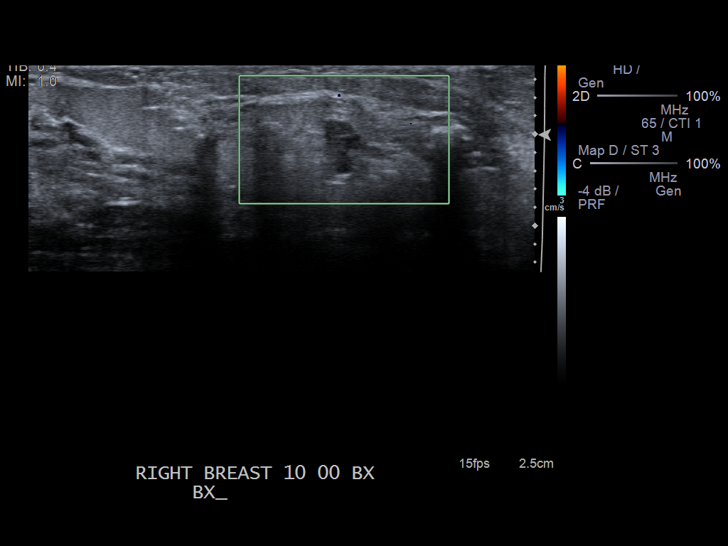

[13 of 17 positions shown; findings below may reference images not displayed]

IMPRESSION: Ultrasound-guided biopsy of the small mass in the right breast at [DATE].

## 2013-09-02 ENCOUNTER — Ambulatory Visit (INDEPENDENT_AMBULATORY_CARE_PROVIDER_SITE_OTHER): Payer: BC Managed Care – PPO | Admitting: Podiatry

## 2013-09-02 ENCOUNTER — Encounter: Payer: Self-pay | Admitting: Podiatry

## 2013-09-02 VITALS — BP 133/85 | HR 89 | Resp 16 | Ht 62.0 in | Wt 170.0 lb

## 2013-09-02 DIAGNOSIS — M79609 Pain in unspecified limb: Secondary | ICD-10-CM

## 2013-09-02 DIAGNOSIS — B079 Viral wart, unspecified: Secondary | ICD-10-CM

## 2013-09-02 NOTE — Progress Notes (Signed)
   Subjective:    Patient ID: Ariana Weeks, female    DOB: May 22, 1971, 43 y.o.   MRN: [id number]  HPI Comments: i have this thing on the bottom of my right foot , right heel sore and painful been there since October has gotten bigger, use little pads on it      Review of Systems  All other systems reviewed and are negative.       Objective:   Physical Exam        Assessment & Plan:

## 2013-09-02 NOTE — Patient Instructions (Signed)

## 2013-09-04 NOTE — Progress Notes (Signed)
Subjective:     Patient ID: Ariana Weeks, female   DOB: May 12, 1971, 43 y.o.   MRN: [id number]  HPI patient presents with lesion on the bottom of the right heel that she states has been very tender and has been there for about 3 months. Has tried little pads on it without relief of symptoms   Review of Systems  All other systems reviewed and are negative.       Objective:   Physical Exam  Nursing note and vitals reviewed. Constitutional: She is oriented to person, place, and time.  Cardiovascular: Intact distal pulses.   Musculoskeletal: Normal range of motion.  Neurological: She is oriented to person, place, and time.  Skin: Skin is warm.   neurovascular status is intact with normal muscle strength and no equinus condition noted plantar aspect right heel upon debridement shows pinpoint bleeding with pain to palpation of a solitary mass that is very tender when pressed     Assessment:     Probable verruca plantaris plantar right    Plan:     H&P done and condition reviewed. I have recommended removal of this and explained the surgery and that we will send it to pathology and the chances that regrowth could occur. Patient wants procedure and today I infiltrated 60 mg Xylocaine with epinephrine excised the mass and applied a small amount phenol to the base was sterile dressing application. It looked very much like a verruca and sent for pathology and patient will be seen back

## 2013-09-07 ENCOUNTER — Encounter: Payer: Self-pay | Admitting: Podiatry

## 2014-01-23 ENCOUNTER — Encounter: Payer: Self-pay | Admitting: Maternal & Fetal Medicine

## 2014-02-13 ENCOUNTER — Encounter: Payer: Self-pay | Admitting: Cardiology

## 2014-03-21 ENCOUNTER — Encounter: Payer: Self-pay | Admitting: Pediatric Cardiology

## 2014-03-24 ENCOUNTER — Observation Stay: Payer: Self-pay | Admitting: Obstetrics and Gynecology

## 2014-03-24 LAB — URINALYSIS, COMPLETE
BILIRUBIN, UR: NEGATIVE
GLUCOSE, UR: NEGATIVE mg/dL (ref 0–75)
Ketone: NEGATIVE
Leukocyte Esterase: NEGATIVE
NITRITE: NEGATIVE
PH: 7 (ref 4.5–8.0)
PROTEIN: NEGATIVE
RBC,UR: 76 /HPF (ref 0–5)
SPECIFIC GRAVITY: 1.005 (ref 1.003–1.030)
Squamous Epithelial: 1

## 2014-04-13 DIAGNOSIS — O35BXX0 Maternal care for other (suspected) fetal abnormality and damage, fetal cardiac anomalies, not applicable or unspecified: Secondary | ICD-10-CM | POA: Insufficient documentation

## 2014-04-13 DIAGNOSIS — O358XX0 Maternal care for other (suspected) fetal abnormality and damage, not applicable or unspecified: Secondary | ICD-10-CM | POA: Insufficient documentation

## 2014-04-13 DIAGNOSIS — O34219 Maternal care for unspecified type scar from previous cesarean delivery: Secondary | ICD-10-CM | POA: Insufficient documentation

## 2014-05-02 ENCOUNTER — Encounter: Payer: Self-pay | Admitting: Pediatric Cardiology

## 2014-05-18 DIAGNOSIS — O0990 Supervision of high risk pregnancy, unspecified, unspecified trimester: Secondary | ICD-10-CM

## 2014-05-18 HISTORY — DX: Supervision of high risk pregnancy, unspecified, unspecified trimester: O09.90

## 2014-06-06 ENCOUNTER — Encounter: Payer: Self-pay | Admitting: Pediatric Cardiology

## 2014-06-26 ENCOUNTER — Encounter: Payer: Self-pay | Admitting: Podiatry

## 2014-08-07 LAB — HM PAP SMEAR: HM Pap smear: NORMAL

## 2014-09-25 ENCOUNTER — Ambulatory Visit: Payer: Self-pay | Admitting: Urology

## 2014-10-05 ENCOUNTER — Ambulatory Visit: Payer: Self-pay | Admitting: Urology

## 2014-12-15 NOTE — Op Note (Signed)
PATIENT NAME:  Ariana Weeks, [name] MR#:  [id number] DATE OF BIRTH:  05-24-1971  DATE OF PROCEDURE:  10/12/2012  PREOPERATIVE DIAGNOSIS:  Acute cholecystitis.   POSTOPERATIVE DIAGNOSIS:  Acute cholecystitis.   PROCEDURE PERFORMED:  Laparoscopic cholecystectomy.   ANESTHESIA:  General.   SPECIMEN:  Gallbladder.   ESTIMATED BLOOD LOSS:  25 mL.   INDICATION FOR SURGERY:  The patient is a pleasant 44 year old female with history of an impacted stone in her gallbladder and recurrent right upper quadrant pain. She presented with worse right upper quadrant pain yesterday and it was believed that she had acute cholecystitis and this was in need of cholecystectomy.   DETAILS OF PROCEDURE:  The patient was brought to the operating room suite. She was laid supine on the operating room table. She was induced, endotracheal tube was placed and general anesthesia was administered. Her abdomen was prepped and draped in a standard surgical fashion. A timeout was then performed correctly identifying the patient name, operative site and procedure to be performed. A supraumbilical incision was made. This was deepened down to the fascia. The fascia was incised. Two stay sutures were placed through the fascia and the peritoneum was entered. A Hasson trocar was placed through the fasciotomy. A 10 mm 30 degree scope was placed into the abdomen and was insufflated. The gallbladder was evaluated. It did have a large amount of omental adhesions. I then placed an 11 mm subxiphoid port and two 5 mm ports approximately 2 fingerbreadths below the costal margin at the midclavicular and anterior axillary line. The lateral most trocar was used to retract the fundus of the gallbladder over the dome of the liver. The cystic duct and cystic artery were then dissected out. Critical view was obtained. The duct and artery were clipped 3 times and ligated. The gallbladder was then taken off the gallbladder fossa. Hemostasis was obtained. The  gallbladder was then taken out through an Endo Catch bag through the supraumbilical incision. There was noted to be some oozing from the omentum that was taken off the gallbladder at the beginning of surgery and I did place 2 clips to allow hemostasis for the omentum. There was no nearby bowel that was in danger with this process. The gallbladder fossa was then found to be hemostatic. The fossa and right upper quadrant were irrigated when it was blood free. I then removed the trocars.   The supraumbilical trocar was closed using the previously placed stay sutures. The skin was closed using interrupted 4-0 Monocryl. Steri-Strips, Telfa gauze and Tegaderm were then used to complete the dressing. The patient was then awoken, extubated and brought to the postanesthesia care unit. There were no immediate complications. Needle, sponge and instrument counts were correct at the end of the procedure.    ____________________________ Si Raiderhristopher A. Erma Joubert, MD cal:si D: 10/12/2012 15:09:47 ET T: 10/12/2012 15:36:02 ET JOB#: [id number]  cc: Cristal Deerhristopher A. Essie Lagunes, MD, <Dictator> Jarvis NewcomerHRISTOPHER A Maddon Horton MD ELECTRONICALLY SIGNED 10/13/2012 14:05

## 2014-12-15 NOTE — Discharge Summary (Signed)
PATIENT NAME:  Ariana Weeks, Libertie E MR#:  [id number] DATE OF BIRTH:  1970/12/21  DATE OF ADMISSION:  10/11/2012 DATE OF DISCHARGE:  10/14/2012  DISCHARGE DIAGNOSES: 1.  Symptomatic cholelithiasis status post laparoscopic cholecystectomy on 10/12/2012.  2.  History of kidney stones.  3.  History of Cesarean section.   DISCHARGE MEDICATIONS:  Percocet 5/325, 1 tab p.o. q.4-6 hours p.r.n. pain.   INDICATION FOR ADMISSION:  This patient is a pleasant 44 year old female who presented with recurrent gallstones and acute abdominal pain.  She was thought to have cholecystitis versus symptomatic cholelithiasis and was admitted for pain control and cholecystectomy.   HOSPITAL COURSE: The patient was admitted on February 17th.  On hospital day 2 she underwent laparoscopic cholecystectomy which was unremarkable. Postoperatively, she was transitioned from clear liquid to regular diet and was transitioned to oral pain medications.  At the time of discharge, her pain was much improved and she was tolerating oral medications.   CONDITION ON DISCHARGE:  She was discharged home in satisfactory condition.   DISCHARGE INSTRUCTIONS:  The patient is to follow up in approximately 1 week with me. She is to call or return to the ED if she has increased pain, fever, nausea, vomiting, redness, drainage from incisions     ____________________________ Si Raiderhristopher A. Lundquist, MD cal:ct D: 10/26/2012 10:36:08 ET T: 10/26/2012 11:14:23 ET JOB#: [id number]  cc: Cristal Deerhristopher A. Lundquist, MD, <Dictator> Jarvis NewcomerHRISTOPHER A LUNDQUIST MD ELECTRONICALLY SIGNED 10/30/2012 11:09

## 2015-01-02 NOTE — H&P (Signed)
L&D Evaluation:  History:  HPI 44 year old G  Recently treated with Bactrim DS for UTI   Presents with abdominal pain, back pain   Patient's Medical History nephrolithiasis, obesity, IBS, hypercholesterolemia   Patient's Surgical History cholecystectomy, C-section, lithotripsy   Medications Pre Natal Vitamins   Allergies azithromycin   Social History none   Family History Non-Contributory   ROS:  ROS All systems were reviewed.  HEENT, CNS, GI, GU, Respiratory, CV, Renal and Musculoskeletal systems were found to be normal.   Exam:  Vital Signs stable   Urine Protein not completed   General no apparent distress   Mental Status clear   Chest no increased work of breathing   Abdomen gravid, non-tender   Estimated Fetal Weight Average for gestational age   Back no CVAT   Edema no edema   Pelvic no external lesions, cervix closed and thick   Mebranes Intact   FHT normal rate with no decels   Ucx absent   Skin dry, no lesions   Plan:  Comments 1) Back/Flank pain - history of nephrolithiasis, reportedly has 4 know kidney stones.  Pain self resolved, ultrasound today showing  stone in right with mild hydronephrosis of 0.8cm,  2 stones on left no hydronephrosis, 3+ hematuria.  No evidence of PTL, cxv closed.  2) Fetus - appropriate tracing for 24 weeks     - known T21     - transfering care to [facility] secondary to cardiac defect  3) PNL - A pos / RI / VZI  4) Follow up 04/13/2014 [facility]   Electronic Signatures: Lorrene ReidStaebler, Renesmae Donahey M (MD)  (Signed 31-Jul-15 12:43)  Authored: L&D Evaluation   Last Updated: 31-Jul-15 12:43 by Lorrene ReidStaebler, Aimee Heldman M (MD)

## 2015-06-13 ENCOUNTER — Inpatient Hospital Stay
Admission: EM | Admit: 2015-06-13 | Discharge: 2015-06-15 | DRG: 076 | Disposition: A | Discharge status: Home / Self Care | Payer: BC Managed Care – PPO | Attending: Internal Medicine | Admitting: Internal Medicine

## 2015-06-13 ENCOUNTER — Encounter: Payer: Self-pay | Admitting: *Deleted

## 2015-06-13 ENCOUNTER — Emergency Department: Payer: BC Managed Care – PPO

## 2015-06-13 DIAGNOSIS — G43009 Migraine without aura, not intractable, without status migrainosus: Secondary | ICD-10-CM | POA: Diagnosis present

## 2015-06-13 DIAGNOSIS — R51 Headache: Secondary | ICD-10-CM

## 2015-06-13 DIAGNOSIS — A879 Viral meningitis, unspecified: Secondary | ICD-10-CM | POA: Diagnosis present

## 2015-06-13 DIAGNOSIS — M545 Low back pain: Secondary | ICD-10-CM | POA: Diagnosis present

## 2015-06-13 DIAGNOSIS — A870 Enteroviral meningitis: Secondary | ICD-10-CM | POA: Diagnosis not present

## 2015-06-13 DIAGNOSIS — F17210 Nicotine dependence, cigarettes, uncomplicated: Secondary | ICD-10-CM | POA: Diagnosis present

## 2015-06-13 DIAGNOSIS — R509 Fever, unspecified: Secondary | ICD-10-CM | POA: Diagnosis not present

## 2015-06-13 DIAGNOSIS — R519 Headache, unspecified: Secondary | ICD-10-CM

## 2015-06-13 DIAGNOSIS — Z6834 Body mass index (BMI) 34.0-34.9, adult: Secondary | ICD-10-CM

## 2015-06-13 DIAGNOSIS — F419 Anxiety disorder, unspecified: Secondary | ICD-10-CM | POA: Diagnosis present

## 2015-06-13 DIAGNOSIS — Z881 Allergy status to other antibiotic agents status: Secondary | ICD-10-CM

## 2015-06-13 DIAGNOSIS — Z87442 Personal history of urinary calculi: Secondary | ICD-10-CM

## 2015-06-13 DIAGNOSIS — E669 Obesity, unspecified: Secondary | ICD-10-CM | POA: Diagnosis present

## 2015-06-13 DIAGNOSIS — Z885 Allergy status to narcotic agent status: Secondary | ICD-10-CM

## 2015-06-13 DIAGNOSIS — F329 Major depressive disorder, single episode, unspecified: Secondary | ICD-10-CM | POA: Diagnosis present

## 2015-06-13 LAB — COMPREHENSIVE METABOLIC PANEL
ALT: 14 U/L (ref 14–54)
AST: 17 U/L (ref 15–41)
Albumin: 4.2 g/dL (ref 3.5–5.0)
Alkaline Phosphatase: 70 U/L (ref 38–126)
Anion gap: 8 (ref 5–15)
BUN: 11 mg/dL (ref 6–20)
CO2: 25 mmol/L (ref 22–32)
CREATININE: 1.21 mg/dL — AB (ref 0.44–1.00)
Calcium: 9.3 mg/dL (ref 8.9–10.3)
Chloride: 104 mmol/L (ref 101–111)
GFR, EST NON AFRICAN AMERICAN: 54 mL/min — AB (ref >60)
Glucose, Bld: 94 mg/dL (ref 65–99)
POTASSIUM: 4 mmol/L (ref 3.5–5.1)
Sodium: 137 mmol/L (ref 135–145)
Total Bilirubin: 1.2 mg/dL (ref 0.3–1.2)
Total Protein: 7.4 g/dL (ref 6.5–8.1)

## 2015-06-13 LAB — CBC WITH DIFFERENTIAL/PLATELET
BASOS ABS: 0 10*3/uL (ref 0–0.1)
Basophils Relative: 0 %
EOS ABS: 0 10*3/uL (ref 0–0.7)
Eosinophils Relative: 0 %
HCT: 41.9 % (ref 35.0–47.0)
Hemoglobin: 14.4 g/dL (ref 12.0–16.0)
LYMPHS PCT: 9 %
Lymphs Abs: 0.8 10*3/uL — ABNORMAL LOW (ref 1.0–3.6)
MCH: 30.4 pg (ref 26.0–34.0)
MCHC: 34.4 g/dL (ref 32.0–36.0)
MCV: 88.5 fL (ref 80.0–100.0)
Monocytes Absolute: 0.9 10*3/uL (ref 0.2–0.9)
Monocytes Relative: 10 %
NEUTROS PCT: 81 %
Neutro Abs: 7.6 10*3/uL — ABNORMAL HIGH (ref 1.4–6.5)
Platelets: 172 10*3/uL (ref 150–440)
RBC: 4.73 MIL/uL (ref 3.80–5.20)
RDW: 13.5 % (ref 11.5–14.5)
WBC: 9.4 10*3/uL (ref 3.6–11.0)

## 2015-06-13 LAB — URINALYSIS COMPLETE WITH MICROSCOPIC (ARMC ONLY)
BILIRUBIN URINE: NEGATIVE
Glucose, UA: NEGATIVE mg/dL
Ketones, ur: NEGATIVE mg/dL
Nitrite: NEGATIVE
PH: 6 (ref 5.0–8.0)
Protein, ur: NEGATIVE mg/dL
Specific Gravity, Urine: 1.012 (ref 1.005–1.030)

## 2015-06-13 LAB — HCG, QUANTITATIVE, PREGNANCY: hCG, Beta Chain, Quant, S: <1 m[IU]/mL (ref <5)

## 2015-06-13 LAB — POCT PREGNANCY, URINE: PREG TEST UR: NEGATIVE

## 2015-06-13 MED ORDER — LIDOCAINE HCL (PF) 1 % IJ SOLN
INTRAMUSCULAR | Status: AC
  Administered 2015-06-13: 30 mL
  Filled 2015-06-13: qty 10

## 2015-06-13 MED ORDER — SODIUM CHLORIDE 0.9 % IV BOLUS (SEPSIS)
1000.0000 mL | Freq: Once | INTRAVENOUS | Status: AC
  Administered 2015-06-13: 1000 mL via INTRAVENOUS

## 2015-06-13 MED ORDER — ACETAMINOPHEN 325 MG PO TABS
650.0000 mg | ORAL_TABLET | Freq: Once | ORAL | Status: AC
  Administered 2015-06-13: 650 mg via ORAL
  Filled 2015-06-13: qty 2

## 2015-06-13 MED ORDER — LIDOCAINE HCL (PF) 1 % IJ SOLN
30.0000 mL | Freq: Once | INTRAMUSCULAR | Status: AC
  Administered 2015-06-13: 30 mL

## 2015-06-13 MED ORDER — DEXTROSE 5 % IV SOLN
2.0000 g | Freq: Once | INTRAVENOUS | Status: AC
  Administered 2015-06-13: 2 g via INTRAVENOUS
  Filled 2015-06-13: qty 2

## 2015-06-13 MED ORDER — MORPHINE SULFATE (PF) 2 MG/ML IV SOLN
2.0000 mg | Freq: Once | INTRAVENOUS | Status: DC
  Filled 2015-06-13: qty 1

## 2015-06-13 MED ORDER — IBUPROFEN 600 MG PO TABS
600.0000 mg | ORAL_TABLET | Freq: Once | ORAL | Status: AC
  Administered 2015-06-13: 600 mg via ORAL
  Filled 2015-06-13: qty 1

## 2015-06-13 MED ORDER — LIDOCAINE-EPINEPHRINE 2 %-1:100000 IJ SOLN: 20.0000 mL | Freq: Once | INTRAMUSCULAR | Status: DC

## 2015-06-13 NOTE — ED Notes (Signed)
POC urine preg (Negative)

## 2015-06-13 NOTE — ED Provider Notes (Addendum)
[facility] Emergency Department Provider Note  ____________________________________________   I have reviewed the triage vital signs and the nursing notes.   HISTORY  Chief Complaint Headache and Fever    HPI MALEAH Weeks is a 44 y.o. female presents today complaining of headache, neck pain, and fever. She states that this all started 2 days ago which time she had some diffuse abdominal pain which went away and then yesterday she had a fever and chills and a headache which was gradual in onset. She states she has pain in the neck for central left side of her neck and she ranges her neck. She has had no vomiting or nausea. She did have some loose stools. He has not had any abdominal pain since 2 days ago. She went to a urgent care where she had a negative flu test and a negative mono test. The rest of her review of systems is remarkably negative. She has not had runny nose or cough or recent URI, she has not had sore throat and difficulty swallowing chest pain dysuria or urinary frequency. She does not have frank meningismus she can move her neck but it is uncomfortable to do so.  Past Medical History  Diagnosis Date  . Headache(784.0)   . Left ureteral calculus   . History of kidney stones   . Head cold   . Kidney stones     Patient Active Problem List   Diagnosis Date Noted  . Abnormal mammogram 01/06/2013  . LOW BACK PAIN, ACUTE 03/08/2009  . DIARRHEA, CHRONIC 10/06/2008  . ABDOMINAL PAIN, GENERALIZED 10/06/2008  . SHOULDER PAIN, RIGHT 12/15/2007  . 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 CERVICALGIA 11/10/2007  . OBESITY 10/06/2007  . ANXIETY 10/06/2007  . TOBACCO ABUSE 10/06/2007  . DEPRESSION 10/06/2007  . MIGRAINE, COMMON 10/06/2007  . PRE-ECLAMPSIA 10/06/2007  . 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 RENAL CALCULUS, HX OF 10/08/2003    Past Surgical History  Procedure Laterality Date  . Cesarean section  2005  . Ureteral stent placement  1998   (APPROX)    Kidney Stone  . Extracorporeal shock wave lithotripsy  2009  . Intrauterine device insertion  MARCH 2012    MURINA  . Cholecystectomy  09/2012    Current Outpatient Rx  Name  Route  Sig  Dispense  Refill  . zolpidem (AMBIEN) 5 MG tablet   Oral   Take by mouth as needed.           Allergies Nitrofurantoin  Family History  Problem Relation Age of Onset  . Cancer Father     Lung  . Alcohol abuse Maternal Uncle   . Alcohol abuse Paternal Uncle   . Cancer Maternal Grandmother     Ovarian  . Cancer Maternal Grandfather     Lymphoma  . Cancer Maternal Aunt 32    great aunt breast late 79's  . Cancer Mother 14    breast age late 21's    Social History Social History  Substance Use Topics  . Smoking status: Current Some Day Smoker -- 0.50 packs/day for 16 years    Types: Cigarettes  . Smokeless tobacco: Never Used  . Alcohol Use: Yes     Comment: RARE    Review of Systems Constitutionalpositivechills Eyes: No visual changes. ENT: No sore throat. positive neck pain  Cardiovascular: Denies chest pain. Respiratory: Denies shortness of breath. Gastrointestinal:   no vomiting.  positive loose stools but no frankrhea.  No constipation. Genitourinary: Negative for dysuria.  Musculoskeletal: Negative lower extremity swelling Skin: Negative for rash. Neurological: Negative for headaches, focal weakness or numbness. 10-point ROS otherwise negative.  ____________________________________________   PHYSICAL EXAM:  VITAL SIGNS: ED Triage Vitals  Enc Vitals Group     BP 06/13/15 1838 162/104 mmHg     Pulse Rate 06/13/15 1838 110     Resp 06/13/15 1838 22     Temp 06/13/15 1838 99.8 F (37.7 C)     Temp Source 06/13/15 1838 Oral     SpO2 06/13/15 1838 99 %     Weight 06/13/15 1838 190 lb (86.183 kg)     Height 06/13/15 1838  (1.575 m)     Head Cir --      Peak Flow --      Pain Score 06/13/15 1841 10     Pain Loc --      Pain Edu? --      Excl.  in GC? --     Constitutional: Alert and oriented. Well appearing and in no acute distress. nontoxic in appearance Eyes: Conjunctivae are normal. PERRL. EOMI. no papilledema on limited exam  Head: Atraumatic. Nose: No congestion/rhinnorhea. Mouth/Throat: Mucous membranes are moist.  Oropharynx non-erythematous. Neck: No stridor.   is minimal tenderness to palpation to the trapezius muscles on the left side but there is no erythema or evidence of abscess or infection. She can range her neck but it is uncomfortable to do so. It is certainly not a stiff neck Cardiovascular: Normal rate, regular rhythm. Grossly normal heart sounds.  Good peripheral circulation. Respiratory: Normal respiratory effort.  No retractions. Lungs CTAB. Gastrointestinal: Soft and nontender. No distention. No guarding no rebound Back:  There is no focal tenderness or step off there is no midline tenderness there are no lesions noted. there is no CVA tenderness Musculoskeletal: No lower extremity tenderness. No joint effusions, no DVT signs strong distal pulses no edema Neurologic:  Normal speech and language. No gross focal neurologic deficits are appreciated.  Skin:  Skin is warm, dry and intact. No rash noted. Psychiatric: Mood and affect are normal. Speech and behavior are normal.  ____________________________________________   LABS (all labs ordered are listed, but only abnormal results are displayed)  Labs Reviewed  COMPREHENSIVE METABOLIC PANEL - Abnormal; Notable for the following:    Creatinine, Ser 1.21 (*)    GFR calc non Af Amer 54 (*)    All other components within normal limits  CBC WITH DIFFERENTIAL/PLATELET - Abnormal; Notable for the following:    Neutro Abs 7.6 (*)    Lymphs Abs 0.8 (*)    All other components within normal limits  URINE CULTURE  CULTURE, BLOOD (ROUTINE X 2)  CULTURE, BLOOD (ROUTINE X 2)  URINALYSIS COMPLETEWITH MICROSCOPIC ([facility] ONLY)    ____________________________________________  EKG   ____________________________________________  RADIOLOGY   ____________________________________________   PROCEDURES  Procedure(s) performed: Unfortunate, patient's body habitus, she is 5 foot 3 and over 200 pounds, makes it very difficult to perform an LP. I did try 3 passes in the L4-L5 disc space but did not received flashback. At that point patient declined to pursue LP she understands the risk of declining it however she states that she does not wish to try anymore. There were no couple occasions and she tolerated it well. I did use 10 cc of lidocaine 1% and sterile procedure is always.  Critical Care performed: None  ____________________________________________   INITIAL IMPRESSION / ASSESSMENT AND PLAN / ED COURSE  Pertinent labs &  imaging results that were available during my care of the patient were reviewed by me and considered in my medical decision making (see chart for details).   patient with what started as abdominal pain, and then loose stools no fever headache and some discomfort in her neck. She is nontoxic and nonfocal however concern does exist for the possibility of meningitis. I have explained extensively to her the risks benefits and alternatives of LP and she consents to it. This includes infection, hematoma, neurologic impact or weakness, post LP headache etc. All usual and customary risks benefits alternatives were discussed Initially she told us her only allergy was Macrobid but we offered her morphine for her headache she declined saying that she had a rash with that in the past. We will give her Tylenol, and we will obviously check a urinalysis and blood cultures and we will give her empiric antibiotics although again if this is meningitis it is most likely viral. Her presentation in general, also be consistent with a viral syndrome. In any event, we will obtain an LP to rule that out. Patient has no  evidence of papilledema, no focal neurologic signs, is not obtunded and has not had a seizure and therefore I do not feel it is unsafe to do a LP without a CT.____________________________________________  ----------------------------------------- 9:54 PM on 06/13/2015 -----------------------------------------  See procedure noted above, again, patient declining LP at this time. She does not wish me to call in IR to have it done under direct visualization. I have offered also try again here but she has declined. She understands that there is a slight risk of meningitis. Given that she had abdominal pain and now states that she actually had hematuria, does not have frank meningismus, has a normal white count, I feel that the risk of bacterial meningitis is very low but I have a splint to the family and the patient that it is not 0 and that is a life-threatening disease. The patient understands all this but would prefer not to have an LP performed. She would like to give a urine sample to see if she has a UTI as she has noticed some hematuria. We will see if that is the cause of her symptoms and fever. If not, again, we will allow her to decide whether she wants an LP.   ----------------------------------------- 10:31 PM on 06/13/2015 -----------------------------------------  I talked to IR who state they are not on call for this after hours have talked to radiology and they feel that this is something that they may not be able to do but they will let me know. Patient does consent to get it done after thoughtful consideration, if I can get someone to come in. We will start antibiotics, patient still quite well-appearing. He has not given me a urine sample. She is trying to do so.  ----------------------------------------- 11:35 PM on 06/13/2015 -----------------------------------------  Pt signed out to Dr. Manson PasseyBrown at the end of my shift.   FINAL CLINICAL IMPRESSION(S) / ED DIAGNOSES  Final  diagnoses:  None     Jeanmarie PlantJames A Maebel Marasco, MD 06/13/15 2106  Jeanmarie PlantJames A Milaya Hora, MD 06/13/15 2142  Jeanmarie PlantJames A Pearlene Teat, MD 06/13/15 2156  Jeanmarie PlantJames A Lazer Wollard, MD 06/13/15 [id number]  Jeanmarie PlantJames A Cayden Granholm, MD 06/13/15 [phone]

## 2015-06-13 NOTE — ED Notes (Addendum)
Pt reports a headache, fever and neck pain.  Sx began yesterday.  [facility] and sent to er for an eval.  Pt denies n/v/d.   Pt alert. Speech clear.

## 2015-06-14 DIAGNOSIS — Z885 Allergy status to narcotic agent status: Secondary | ICD-10-CM | POA: Diagnosis not present

## 2015-06-14 DIAGNOSIS — M545 Low back pain: Secondary | ICD-10-CM | POA: Diagnosis present

## 2015-06-14 DIAGNOSIS — F329 Major depressive disorder, single episode, unspecified: Secondary | ICD-10-CM | POA: Diagnosis present

## 2015-06-14 DIAGNOSIS — Z881 Allergy status to other antibiotic agents status: Secondary | ICD-10-CM | POA: Diagnosis not present

## 2015-06-14 DIAGNOSIS — R509 Fever, unspecified: Secondary | ICD-10-CM | POA: Diagnosis present

## 2015-06-14 DIAGNOSIS — Z87442 Personal history of urinary calculi: Secondary | ICD-10-CM | POA: Diagnosis not present

## 2015-06-14 DIAGNOSIS — A870 Enteroviral meningitis: Secondary | ICD-10-CM | POA: Diagnosis present

## 2015-06-14 DIAGNOSIS — F419 Anxiety disorder, unspecified: Secondary | ICD-10-CM | POA: Diagnosis present

## 2015-06-14 DIAGNOSIS — Z6834 Body mass index (BMI) 34.0-34.9, adult: Secondary | ICD-10-CM | POA: Diagnosis not present

## 2015-06-14 DIAGNOSIS — A879 Viral meningitis, unspecified: Secondary | ICD-10-CM | POA: Diagnosis present

## 2015-06-14 DIAGNOSIS — G43009 Migraine without aura, not intractable, without status migrainosus: Secondary | ICD-10-CM | POA: Diagnosis present

## 2015-06-14 DIAGNOSIS — F17210 Nicotine dependence, cigarettes, uncomplicated: Secondary | ICD-10-CM | POA: Diagnosis present

## 2015-06-14 DIAGNOSIS — E669 Obesity, unspecified: Secondary | ICD-10-CM | POA: Diagnosis present

## 2015-06-14 LAB — PROTEIN AND GLUCOSE, CSF
Glucose, CSF: 58 mg/dL (ref 40–70)
TOTAL PROTEIN, CSF: 32 mg/dL (ref 15–45)

## 2015-06-14 LAB — CSF CELL COUNT WITH DIFFERENTIAL
EOS CSF: 0 %
Eosinophils, CSF: 0 %
Lymphs, CSF: 100 %
Lymphs, CSF: 67 %
MONOCYTE-MACROPHAGE-SPINAL FLUID: 0 %
MONOCYTE-MACROPHAGE-SPINAL FLUID: 0 %
OTHER CELLS CSF: 0
OTHER CELLS CSF: 0
RBC COUNT CSF: 19 /mm3 — AB (ref 0–3)
RBC COUNT CSF: 3 /mm3 (ref 0–3)
Segmented Neutrophils-CSF: 0 %
Segmented Neutrophils-CSF: 33 %
Tube #: 1
Tube #: 4
WBC, CSF: 3 /mm3
WBC, CSF: 5 /mm3

## 2015-06-14 LAB — GRAM STAIN: Gram Stain: NONE SEEN

## 2015-06-14 LAB — CRYPTOCOCCAL ANTIGEN, CSF: CRYPTO AG: NEGATIVE

## 2015-06-14 LAB — INFLUENZA PANEL BY PCR (TYPE A & B)
H1N1FLUPCR: NOT DETECTED
INFLAPCR: NEGATIVE
INFLBPCR: NEGATIVE

## 2015-06-14 LAB — TSH: TSH: 3.644 u[IU]/mL (ref 0.350–4.500)

## 2015-06-14 LAB — HEMOGLOBIN A1C: Hgb A1c MFr Bld: 5.4 % (ref 4.0–6.0)

## 2015-06-14 MED ORDER — HEPARIN SODIUM (PORCINE) 5000 UNIT/ML IJ SOLN
5000.0000 [IU] | Freq: Three times a day (TID) | INTRAMUSCULAR | Status: DC
  Administered 2015-06-14 – 2015-06-15 (×4): 5000 [IU] via SUBCUTANEOUS
  Filled 2015-06-14 (×4): qty 1

## 2015-06-14 MED ORDER — FENTANYL CITRATE (PF) 100 MCG/2ML IJ SOLN
25.0000 ug | Freq: Once | INTRAMUSCULAR | Status: AC
  Administered 2015-06-14: 25 ug via INTRAVENOUS
  Filled 2015-06-14: qty 2

## 2015-06-14 MED ORDER — ONDANSETRON HCL 4 MG/2ML IJ SOLN
4.0000 mg | Freq: Once | INTRAMUSCULAR | Status: AC
  Administered 2015-06-14: 4 mg via INTRAVENOUS
  Filled 2015-06-14: qty 2

## 2015-06-14 MED ORDER — ONDANSETRON HCL 4 MG PO TABS: 4.0000 mg | ORAL_TABLET | Freq: Four times a day (QID) | ORAL | Status: DC | PRN

## 2015-06-14 MED ORDER — IBUPROFEN 400 MG PO TABS
200.0000 mg | ORAL_TABLET | Freq: Four times a day (QID) | ORAL | Status: DC | PRN
  Administered 2015-06-14: 200 mg via ORAL
  Filled 2015-06-14: qty 1

## 2015-06-14 MED ORDER — ACETAMINOPHEN 650 MG RE SUPP: 650.0000 mg | Freq: Four times a day (QID) | RECTAL | Status: DC | PRN

## 2015-06-14 MED ORDER — OXYCODONE HCL 5 MG PO TABS
5.0000 mg | ORAL_TABLET | ORAL | Status: DC | PRN
  Administered 2015-06-14 – 2015-06-15 (×2): 5 mg via ORAL
  Filled 2015-06-14 (×2): qty 1

## 2015-06-14 MED ORDER — DOCUSATE SODIUM 100 MG PO CAPS
100.0000 mg | ORAL_CAPSULE | Freq: Two times a day (BID) | ORAL | Status: DC
  Filled 2015-06-14 (×2): qty 1

## 2015-06-14 MED ORDER — SODIUM CHLORIDE 0.9 % IV SOLN
INTRAVENOUS | Status: DC
  Administered 2015-06-14: 06:00:00 via INTRAVENOUS

## 2015-06-14 MED ORDER — IBUPROFEN 400 MG PO TABS
600.0000 mg | ORAL_TABLET | Freq: Four times a day (QID) | ORAL | Status: DC | PRN
  Administered 2015-06-14 – 2015-06-15 (×2): 600 mg via ORAL
  Filled 2015-06-14 (×2): qty 2

## 2015-06-14 MED ORDER — MORPHINE SULFATE (PF) 2 MG/ML IV SOLN: 2.0000 mg | INTRAVENOUS | Status: DC | PRN

## 2015-06-14 MED ORDER — ACETAMINOPHEN 325 MG PO TABS: 650.0000 mg | ORAL_TABLET | Freq: Four times a day (QID) | ORAL | Status: DC | PRN

## 2015-06-14 MED ORDER — ONDANSETRON HCL 4 MG/2ML IJ SOLN: 4.0000 mg | Freq: Four times a day (QID) | INTRAMUSCULAR | Status: DC | PRN

## 2015-06-14 MED ORDER — METAXALONE 400 MG HALF TABLET
400.0000 mg | ORAL_TABLET | Freq: Three times a day (TID) | ORAL | Status: DC
  Administered 2015-06-14 – 2015-06-15 (×4): 400 mg via ORAL
  Filled 2015-06-14 (×6): qty 1

## 2015-06-14 MED ORDER — HYDROMORPHONE HCL 1 MG/ML IJ SOLN
1.0000 mg | Freq: Once | INTRAMUSCULAR | Status: AC
  Administered 2015-06-14: 1 mg via INTRAVENOUS
  Filled 2015-06-14: qty 1

## 2015-06-14 NOTE — Progress Notes (Signed)
Pt developed red face,flushing and itching after dilaudid. Added to allergy list. Dr Luberta Mutterkonidena notifed

## 2015-06-14 NOTE — Progress Notes (Signed)
Pt with h/a. Allergic to morphine. md orders 1mg  dilaudid once and see if no reaction. Spoke with dr Luberta Mutterkonidena

## 2015-06-14 NOTE — ED Notes (Signed)
Brown, MD at bedside. 

## 2015-06-14 NOTE — Progress Notes (Addendum)
[facility] [place] [facility]   PATIENT NAME: Ariana Weeks    MR#:  [id number]  DATE OF BIRTH:  01/14/1971  SUBJECTIVE: 44 year old female patient admitted for septic meningitis. She complains of left-sided headache and also left-sided neck pain at this time. No other complaints. Had episode of diarrhea and abdominal pain now and yesterday morning. No rash.   CHIEF COMPLAINT:   Chief Complaint  Patient presents with  . Headache  . Fever    REVIEW OF SYSTEMS:    ROS  Nutrition:  Tolerating Diet: Tolerating PT:      DRUG ALLERGIES:   Allergies  Allergen Reactions  . Nitrofurantoin Hives    Macrobid  . Morphine And Related Hives and Rash    VITALS:  Blood pressure 138/80, pulse 91, temperature 100.7 F (38.2 C), temperature source Oral, resp. rate 18, height  (1.575 m), weight 86.183 kg (190 lb), SpO2 98 %.  PHYSICAL EXAMINATION:   Physical Exam  GENERAL:  44 y.o.-year-old patient lying in the bed with no acute distress.  EYES: Pupils equal, round, reactive to light and accommodation. No scleral icterus. Extraocular muscles intact.  HEENT: Head atraumatic, normocephalic. Oropharynx and nasopharynx clear.  NECK:  Supple, no jugular venous distention. No thyroid enlargement, no tenderness.  LUNGS: Normal breath sounds bilaterally, no wheezing, rales,rhonchi or crepitation. No use of accessory muscles of respiration.  CARDIOVASCULAR: S1, S2 normal. No murmurs, rubs, or gallops.  ABDOMEN: Soft, nontender, nondistended. Bowel sounds present. No organomegaly or mass.  EXTREMITIES: No pedal edema, cyanosis, or clubbing.  NEUROLOGIC: Cranial nerves II through XII are intact. Muscle strength 5/5 in all extremities. Sensation intact. Gait not checked.  PSYCHIATRIC: The patient is alert and oriented x 3.  SKIN: No obvious rash, lesion, or ulcer.    LABORATORY PANEL:   CBC  Recent Labs Lab 06/13/15 1844  WBC 9.4  HGB 14.4  HCT 41.9   PLT 172   ------------------------------------------------------------------------------------------------------------------  Chemistries   Recent Labs Lab 06/13/15 1844  NA 137  K 4.0  CL 104  CO2 25  GLUCOSE 94  BUN 11  CREATININE 1.21*  CALCIUM 9.3  AST 17  ALT 14  ALKPHOS 70  BILITOT 1.2   ------------------------------------------------------------------------------------------------------------------  Cardiac Enzymes No results for input(s): TROPONINI in the last 168 hours. ------------------------------------------------------------------------------------------------------------------  RADIOLOGY:  Dg Lumbar Puncture Fluoro Guide  06/14/2015  CLINICAL DATA:  Fever, headache, evaluate for meningitis EXAM: DIAGNOSTIC LUMBAR PUNCTURE UNDER FLUOROSCOPIC GUIDANCE FLUOROSCOPY TIME:  Radiation Exposure Index (as provided by the fluoroscopic device): 75.95 d Gy cm2 PROCEDURE: Informed consent was obtained from the patient prior to the procedure, including potential complications of headache, allergy, and pain. With the patient prone, the lower back was prepped with Betadine. 1% Lidocaine was used for local anesthesia. Lumbar puncture was performed at the L2-3 level using a 22 gauge needle with return of clear CSF with an opening pressure of 18 cm water. 14 ml of CSF were obtained for laboratory studies. The patient tolerated the procedure well and there were no apparent complications. IMPRESSION: Successful fluoroscopic guided lumbar puncture, as above. Electronically Signed   By: Charline Bills M.D.   On: 06/14/2015 08:15     ASSESSMENT AND PLAN:   Active Problems:   Viral meningitis   1 possible viral meningitis: Had fever and headache: CSF culture showed no growth yet has lymphocytic predominance. Empirically treat for the meningitis with Rocephin, follow culture results including HSV. Likely enteroviral ; with recent  abdominal pain and diarrhea. Continue contact,  droplet precautions until we know culture data from CSF.  headache left side with neck pain: We will add Skelaxin as a muscle relaxant.continue Dilaudid. Spoke with patient's husband,he had questions about timewhen csf cultures become available,told him that cultures take atleast 24hrs,and still pending, Will order flu test also due to her headache,fever,she said she did not get flu shot yet,     All the records are reviewed and case discussed with Care Management/Social Workerr. Management plans discussed with the patient, family and they are in agreement.  CODE STATUS: full  TOTAL TIME TAKING CARE OF THIS PATIENT: 30  minutes.   POSSIBLE D/C IN 1-2 DAYS, DEPENDING ON CLINICAL CONDITION.   Katha HammingKONIDENA,Gita Dilger M.D on 06/14/2015 at 10:17 AM  Between 7am to 6pm - Pager - [phone]  After 6pm go to [email or web address] - password [email or web address]  [facility]  Office  [phone]  CC: Primary care physician; No PCP Per Patient

## 2015-06-14 NOTE — Progress Notes (Signed)
rn contacted  Dr Luberta Mutterkonidena  Re: need for po analgesia after dilaidid sensitivity. md orders increase motrin to 600mg  po q6hr prn and add oxycodone 5-10mg  po q4hr prn. Will continue to assess for allergies

## 2015-06-14 NOTE — ED Provider Notes (Signed)
I assumed care of the patient at 11:00 PM from Dr. Alphonzo LemmingsMcshane. Fluoroscopy-guided lumbar puncture was performed which revealed a protein of 32 and glucose of 58 initial Gram stain revealed no organisms. Cultures pending. I evaluated the patient with ongoing headache currently 6 out of 10. Patient discussed with Dr. Sheryle Haildiamond hospitalist staff for admission for further evaluation and management.  Darci Currentandolph N Sendy Pluta, MD 06/14/15 [phone]

## 2015-06-14 NOTE — H&P (Signed)
Ariana Weeks is an 44 y.o. female.   Chief Complaint: Headache HPI: Patient presents emergency department complaining of headache and fever. The patient measured her fever 102.37F. She states that she began feeling bad 2 days ago with nausea and some diarrhea. She developed a headache and subsequently fever today. 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 She denies vision changes, difficulty swallowing or speaking as well as weakness. Urgency department initial lumbar puncture was unsuccessful so the patient underwent fluoroscopically guided lumbar puncture which showed relatively clear cerebral spinal fluid. Protein and glucose results were consistent with viral meningitis was found to the emergency department staff to call for admission.  Past Medical History  Diagnosis Date  . Headache(784.0)   . Left ureteral calculus   . History of kidney stones   . Head cold   . Kidney stones     Past Surgical History  Procedure Laterality Date  . Cesarean section  2005  . Ureteral stent placement  1998  (APPROX)    Kidney Stone  . Extracorporeal shock wave lithotripsy  2009  . Intrauterine device insertion  MARCH 2012    [facility]  . Cholecystectomy  09/2012    Family History  Problem Relation Age of Onset  . Cancer Father     Lung  . Alcohol abuse Maternal Uncle   . Alcohol abuse Paternal Uncle   . Cancer Maternal Grandmother     Ovarian  . Cancer Maternal Grandfather     Lymphoma  . Cancer Maternal Aunt 73    great aunt breast late 80's  . Cancer Mother 34    breast age late 33's   Social History:  reports that she has been smoking Cigarettes.  She has a 8 pack-year smoking history. She has never used smokeless tobacco. She reports that she drinks alcohol. She reports that she does not use illicit drugs.  Allergies:  Allergies  Allergen Reactions  . Nitrofurantoin Hives    Macrobid    Prior to Admission medications   Medication Sig  Start Date End Date Taking? Authorizing Provider  ibuprofen (ADVIL,MOTRIN) 200 MG tablet Take 200 mg by mouth every 6 (six) hours as needed.   Yes Historical Provider, MD     Results for orders placed or performed during the hospital encounter of 06/13/15 (from the past 48 hour(s))  Comprehensive metabolic panel     Status: Abnormal   Collection Time: 06/13/15  6:44 PM  Result Value Ref Range   Sodium 137 135 - 145 mmol/L   Potassium 4.0 3.5 - 5.1 mmol/L   Chloride 104 101 - 111 mmol/L   CO2 25 22 - 32 mmol/L   Glucose, Bld 94 65 - 99 mg/dL   BUN 11 6 - 20 mg/dL   Creatinine, Ser 1.21 (H) 0.44 - 1.00 mg/dL   Calcium 9.3 8.9 - 10.3 mg/dL   Total Protein 7.4 6.5 - 8.1 g/dL   Albumin 4.2 3.5 - 5.0 g/dL   AST 17 15 - 41 U/L   ALT 14 14 - 54 U/L   Alkaline Phosphatase 70 38 - 126 U/L   Total Bilirubin 1.2 0.3 - 1.2 mg/dL   GFR calc non Af Amer 54 (L) >60 mL/min   GFR calc Af Amer >60 >60 mL/min    Comment: (NOTE) The eGFR has been calculated using the CKD EPI equation. This calculation has not been validated in all clinical situations. eGFR's persistently <60 mL/min signify  possible Chronic Kidney Disease.    Anion gap 8 5 - 15  CBC with Differential     Status: Abnormal   Collection Time: 06/13/15  6:44 PM  Result Value Ref Range   WBC 9.4 3.6 - 11.0 K/uL   RBC 4.73 3.80 - 5.20 MIL/uL   Hemoglobin 14.4 12.0 - 16.0 g/dL   HCT 41.9 35.0 - 47.0 %   MCV 88.5 80.0 - 100.0 fL   MCH 30.4 26.0 - 34.0 pg   MCHC 34.4 32.0 - 36.0 g/dL   RDW 13.5 11.5 - 14.5 %   Platelets 172 150 - 440 K/uL   Neutrophils Relative % 81 %   Neutro Abs 7.6 (H) 1.4 - 6.5 K/uL   Lymphocytes Relative 9 %   Lymphs Abs 0.8 (L) 1.0 - 3.6 K/uL   Monocytes Relative 10 %   Monocytes Absolute 0.9 0.2 - 0.9 K/uL   Eosinophils Relative 0 %   Eosinophils Absolute 0.0 0 - 0.7 K/uL   Basophils Relative 0 %   Basophils Absolute 0.0 0 - 0.1 K/uL  hCG, quantitative, pregnancy     Status: None   Collection Time:  06/13/15  6:44 PM  Result Value Ref Range   hCG, Beta Chain, Quant, S <1 <5 mIU/mL    Comment:          GEST. AGE      CONC.  (mIU/mL)   <=1 WEEK        5 - 50     2 WEEKS       50 - 500     3 WEEKS       100 - 10,000     4 WEEKS     1,000 - 30,000     5 WEEKS     3,500 - 115,000   6-8 WEEKS     12,000 - 270,000    12 WEEKS     15,000 - 220,000        FEMALE AND NON-PREGNANT FEMALE:     LESS THAN 5 mIU/mL   Urinalysis complete, with microscopic ([facility] only)     Status: Abnormal   Collection Time: 06/13/15 10:40 PM  Result Value Ref Range   Color, Urine YELLOW (A) YELLOW   APPearance CLEAR (A) CLEAR   Glucose, UA NEGATIVE NEGATIVE mg/dL   Bilirubin Urine NEGATIVE NEGATIVE   Ketones, ur NEGATIVE NEGATIVE mg/dL   Specific Gravity, Urine 1.012 1.005 - 1.030   Hgb urine dipstick 2+ (A) NEGATIVE   pH 6.0 5.0 - 8.0   Protein, ur NEGATIVE NEGATIVE mg/dL   Nitrite NEGATIVE NEGATIVE   Leukocytes, UA TRACE (A) NEGATIVE   RBC / HPF 6-30 0 - 5 RBC/hpf   WBC, UA 6-30 0 - 5 WBC/hpf   Bacteria, UA RARE (A) NONE SEEN   Squamous Epithelial / LPF 0-5 (A) NONE SEEN   Mucous PRESENT   Pregnancy, urine POC     Status: None   Collection Time: 06/13/15 11:05 PM  Result Value Ref Range   Preg Test, Ur NEGATIVE NEGATIVE    Comment:        THE SENSITIVITY OF THIS METHODOLOGY IS >24 mIU/mL   CSF cell count with differential collection tube #: 1     Status: Abnormal   Collection Time: 06/13/15 11:33 PM  Result Value Ref Range   Tube # 1    Color, CSF CLEAR (A) COLORLESS   Appearance, CSF COLORLESS (A) CLEAR   Supernatant CLEAR  RBC Count, CSF 3 0 - 3 /cu mm   WBC, CSF 3 /cu mm   Segmented Neutrophils-CSF 33 %   Lymphs, CSF 67 %   Monocyte-Macrophage-Spinal Fluid 0 %   Eosinophils, CSF 0 %   Other Cells, CSF 0   CSF cell count with differential collection tube #: 4     Status: Abnormal   Collection Time: 06/13/15 11:33 PM  Result Value Ref Range   Tube # 4    Color, CSF CLEAR (A)  COLORLESS   Appearance, CSF COLORLESS (A) CLEAR   Supernatant CLEAR    RBC Count, CSF 19 (H) 0 - 3 /cu mm   WBC, CSF 5 /cu mm   Segmented Neutrophils-CSF 0 %   Lymphs, CSF 100 %   Monocyte-Macrophage-Spinal Fluid 0 %   Eosinophils, CSF 0 %   Other Cells, CSF 0   Gram stain     Status: None   Collection Time: 06/13/15 11:33 PM  Result Value Ref Range   Specimen Description LP    Special Requests NONE    Gram Stain NO ORGANISMS SEEN    Report Status 06/14/2015 FINAL   Protein and glucose, CSF     Status: None   Collection Time: 06/13/15 11:33 PM  Result Value Ref Range   Glucose, CSF 58 40 - 70 mg/dL   Total  Protein, CSF 32 15 - 45 mg/dL   No results found.  Review of Systems  Constitutional: Positive for fever. Negative for chills.  HENT: Negative for sore throat and tinnitus.   Eyes: Negative for blurred vision and redness.  Respiratory: Negative for cough and shortness of breath.   Cardiovascular: Negative for chest pain, palpitations, orthopnea and PND.  Gastrointestinal: Negative for nausea, vomiting, abdominal pain and diarrhea.  Genitourinary: Negative for dysuria, urgency and frequency.  Musculoskeletal: Negative for myalgias and joint pain.  Skin: Negative for rash.       No lesions  Neurological: Positive for headaches. Negative for speech change, focal weakness and weakness.  Endo/Heme/Allergies: Does not bruise/bleed easily.       No temperature intolerance  Psychiatric/Behavioral: Negative for depression and suicidal ideas.    Blood pressure 104/57, pulse 69, temperature 98.2 F (36.8 C), temperature source Oral, resp. rate 16, height _0  (1.575 m), weight 86.183 kg (190 lb), SpO2 97 %. Physical Exam  Nursing note and vitals reviewed. Constitutional: She is oriented to person, place, and time. She appears well-developed and well-nourished. No distress.  HENT:  Head: Normocephalic and atraumatic.  Mouth/Throat: Oropharynx is clear and moist.  Eyes:  Conjunctivae and EOM are normal. Pupils are equal, round, and reactive to light. No scleral icterus.  Neck: Normal range of motion. Neck supple. No JVD present. No tracheal deviation present. No thyromegaly present.  Cardiovascular: Normal rate, regular rhythm and normal heart sounds.  Exam reveals no gallop and no friction rub.   No murmur heard. Respiratory: Effort normal and breath sounds normal.  GI: Soft. Bowel sounds are normal. She exhibits no distension. There is no tenderness.  Genitourinary:  Deferred  Musculoskeletal: Normal range of motion. She exhibits no edema.  Lymphadenopathy:    She has no cervical adenopathy.  Neurological: She is alert and oriented to person, place, and time. No cranial nerve deficit. She exhibits normal muscle tone.  Skin: Skin is warm and dry. No rash noted. No erythema.  Psychiatric: She has a normal mood and affect. Her behavior is normal. Judgment and thought content normal.  Assessment/Plan This is a 44 year old Caucasian female admitted for viral meningitis. 1. Meningitis: Viral; glucose both of the low with elevated protein. Culture and Gram stain pending. Will place patient on droplet precautions and manage her headache as best we can. Consider blood patch if headache is persistent. 2. Tobacco abuse: Intermittent. Will order NicoDerm patch for the patient if she endorses cigarette cravings. 3. Obesity: BMI 34.8; encouraged healthy diet and exercise 4. DVT prophylaxis: Heparin 5. GI prophylaxis: None The patient is a full code. Time spent on admission orders and patient care approximately 35 minutes  Harrie Foreman 06/14/2015, 4:31 AM

## 2015-06-14 NOTE — Progress Notes (Signed)
Continued left side frontal h/a but has lessened. Skelaxin has decreased neck pain.afebrile. Vss. No nausea. Sleeping most of afternnon. Spouse at bedside. Has talked to md for update. Csf cultures pending

## 2015-06-15 LAB — URINE CULTURE

## 2015-06-15 MED ORDER — LEVOFLOXACIN 750 MG PO TABS: 750.0000 mg | ORAL_TABLET | Freq: Every day | ORAL | Status: DC

## 2015-06-15 MED ORDER — DEXTROSE 5 % IV SOLN
2.0000 g | Freq: Once | INTRAVENOUS | Status: AC
  Administered 2015-06-15: 2 g via INTRAVENOUS
  Filled 2015-06-15: qty 2

## 2015-06-15 MED ORDER — DEXTROSE 5 % IV SOLN: 2.0000 g | INTRAVENOUS | Status: DC

## 2015-06-15 NOTE — Progress Notes (Signed)
     Loleta DickerKelley Glasper was admitted to the Hospital on 06/13/2015 and Discharged  06/15/2015 and should be excused from work/school   for 3 days starting 06/13/2015 , may return to work/school without any restrictions.on 10/24/    [facility]ONIDENA,Mitchell Epling M.D on 06/15/2015,at 8:02 AM

## 2015-06-15 NOTE — Progress Notes (Signed)
Pt to be discharged home with mother. Lab called to add HSV  To csf  That will go out to [facility]  Today. Pt discharged home on levaquin,skelaxin  And motrin. I/s on new meds. Understanding voiced. Left via w/c. Received dose of iv rocephin prior to discharge

## 2015-06-16 LAB — HERPES SIMPLEX VIRUS(HSV) DNA BY PCR
HSV 1 DNA: NEGATIVE
HSV 2 DNA: NEGATIVE

## 2015-06-17 LAB — CSF CULTURE
CULTURE: NO GROWTH
GRAM STAIN: NONE SEEN

## 2015-06-17 LAB — CSF CULTURE W GRAM STAIN

## 2015-06-18 NOTE — Discharge Summary (Signed)
Ariana Weeks, is a 44 y.o. female  DOB [birth year]  MRN [id number].  Admission date:  06/13/2015  Admitting Physician  Arnaldo Natal, MD  Discharge Date:  06/15/2015   Primary MD  No PCP Per Patient  Recommendations for primary care physician for things to follow:  Follow up with PMD as needed   Admission Diagnosis  Fever [R50.9] Acute intractable headache, unspecified headache type [R51]   Discharge Diagnosis  Fever [R50.9] Acute intractable headache, unspecified headache type [R51]    Active Problems:   Viral meningitis      Past Medical History  Diagnosis Date  . Headache(784.0)   . Left ureteral calculus   . History of kidney stones   . Head cold   . Kidney stones     Past Surgical History  Procedure Laterality Date  . Cesarean section  2005  . Ureteral stent placement  1998  (APPROX)    Kidney Stone  . Extracorporeal shock wave lithotripsy  2009  . Intrauterine device insertion  MARCH 2012    MURINA  . Cholecystectomy  09/2012       History of present illness and  Hospital Course:     Kindly see H&P for history of present illness and admission details, please review complete Labs, Consult reports and Test reports for all details in brief  HPI  from the history and physical done on the day of admission  44 yr old female with no past medical problems admitted for fever,headache.fever102.f.admitted for possible viral/atypical meningitis.no LOc,no  Confusion.   Hospital Course   1.headache/fever;possible viral meningitis,CSF cultures are ngative for  Bacteria,Fungus.no wbc,no further fever;empirically given rocephin and discharged home with Levaquin.pt is a school teacher,her symptoms and most consistent with Enteroviral.received IV hydration.for left side neck pain,headache.she received  skelaxin.which helped,so we wrote small prescription for it.her flu test  Is negative,   Discharge Condition:stable   Follow UP      Discharge Instructions  and  Discharge Medications       Medication List    TAKE these medications        ibuprofen 200 MG tablet  Commonly known as:  ADVIL,MOTRIN  Take 200 mg by mouth every 6 (six) hours as needed.     levofloxacin 750 MG tablet  Commonly known as:  LEVAQUIN  Take 1 tablet (750 mg total) by mouth daily.          Diet and Activity recommendation: See Discharge Instructions above   Consults obtained -none   Major procedures and Radiology Reports - PLEASE review detailed and final reports for all details, in brief -     Dg Lumbar Puncture Fluoro Guide  06/14/2015  CLINICAL DATA:  Fever, headache, evaluate for meningitis EXAM: DIAGNOSTIC LUMBAR PUNCTURE UNDER FLUOROSCOPIC GUIDANCE FLUOROSCOPY TIME:  Radiation Exposure Index (as provided by the fluoroscopic device): 75.95 d Gy cm2 PROCEDURE: Informed consent was obtained from the patient prior to the procedure, including potential complications of headache, allergy, and pain. With the patient prone, the lower back was prepped with Betadine. 1% Lidocaine was used for local anesthesia. Lumbar puncture was performed at the L2-3 level using a 22 gauge needle with return of clear CSF with an opening pressure of 18 cm water. 14 ml of CSF were obtained for laboratory studies. The patient tolerated the procedure well and there were no apparent complications. IMPRESSION: Successful fluoroscopic guided lumbar puncture, as above. Electronically Signed   By: Charline Bills M.D.   On:  06/14/2015 08:15    Micro Results    Recent Results (from the past 240 hour(s))  Culture, blood (routine x 2)     Status: None (Preliminary result)   Collection Time: 06/13/15  8:40 PM  Result Value Ref Range Status   Specimen Description BLOOD LEFT ARM  Final   Special Requests BOTTLES DRAWN  AEROBIC AND ANAEROBIC  Final   Culture NO GROWTH 4 DAYS  Final   Report Status PENDING  Incomplete  Culture, blood (routine x 2)     Status: None (Preliminary result)   Collection Time: 06/13/15  9:20 PM  Result Value Ref Range Status   Specimen Description BLOOD LEFT ARM  Final   Special Requests BOTTLES DRAWN AEROBIC AND ANAEROBIC  Final   Culture NO GROWTH 4 DAYS  Final   Report Status PENDING  Incomplete  Urine culture     Status: None   Collection Time: 06/13/15 10:40 PM  Result Value Ref Range Status   Specimen Description URINE, CLEAN CATCH  Final   Special Requests NONE  Final   Culture MULTIPLE SPECIES PRESENT, SUGGEST RECOLLECTION  Final   Report Status 06/15/2015 FINAL  Final  CSF culture     Status: None   Collection Time: 06/13/15 11:33 PM  Result Value Ref Range Status   Specimen Description CSF  Final   Special Requests NONE  Final   Gram Stain NO RBCS OR WBCS SEEN NO ORGANISMS SEEN   Final   Culture NO GROWTH 3 DAYS  Final   Report Status 06/17/2015 FINAL  Final  Gram stain     Status: None   Collection Time: 06/13/15 11:33 PM  Result Value Ref Range Status   Specimen Description LP  Final   Special Requests NONE  Final   Gram Stain NO ORGANISMS SEEN  Final   Report Status 06/14/2015 FINAL  Final       Today   Subjective:   Ariana Weeks today has no headache,no chest abdominal pain,no new weakness tingling or numbness, feels much better wants to go home today.   Objective:   Blood pressure 112/74, pulse 85, temperature 99 F (37.2 C), temperature source Oral, resp. rate 20, height  (1.575 m), weight 86.183 kg (190 lb), SpO2 94 %.  No intake or output data in the 24 hours ending 06/18/15 0910  Exam Awake Alert, Oriented x 3, No new F.N deficits, Normal affect [place].AT,PERRAL Supple Neck,No JVD, No cervical lymphadenopathy appriciated.  Symmetrical Chest wall movement, Good air movement bilaterally, CTAB RRR,No Gallops,Rubs or new  Murmurs, No Parasternal Heave +ve B.Sounds, Abd Soft, Non tender, No organomegaly appriciated, No rebound -guarding or rigidity. No Cyanosis, Clubbing or edema, No new Rash or bruise  Data Review   CBC w Diff:  Lab Results  Component Value Date   WBC 9.4 06/13/2015   WBC 11.6* 10/13/2012   HGB 14.4 06/13/2015   HGB 13.2 10/13/2012   HCT 41.9 06/13/2015   HCT 38.9 10/13/2012   PLT 172 06/13/2015   PLT 172 10/13/2012   LYMPHOPCT 9 06/13/2015   LYMPHOPCT 12.6 10/13/2012   MONOPCT 10 06/13/2015   MONOPCT 8.6 10/13/2012   EOSPCT 0 06/13/2015   EOSPCT 0.2 10/13/2012   BASOPCT 0 06/13/2015   BASOPCT 0.3 10/13/2012    CMP:  Lab Results  Component Value Date   NA 137 06/13/2015   NA 142 10/13/2012   K 4.0 06/13/2015   K 3.7 10/13/2012   CL 104  06/13/2015   CL 108* 10/13/2012   CO2 25 06/13/2015   CO2 25 10/13/2012   BUN 11 06/13/2015   BUN 8 10/13/2012   CREATININE 1.21* 06/13/2015   CREATININE 0.75 10/13/2012   PROT 7.4 06/13/2015   PROT 6.4 10/13/2012   ALBUMIN 4.2 06/13/2015   ALBUMIN 3.2* 10/13/2012   BILITOT 1.2 06/13/2015   BILITOT 1.2* 10/13/2012   ALKPHOS 70 06/13/2015   ALKPHOS 53 10/13/2012   AST 17 06/13/2015   AST 40* 10/13/2012   ALT 14 06/13/2015   ALT 38 10/13/2012  .   Total Time in preparing paper work, data evaluation and todays exam - 35 minutes  Myson Levi M.D on 06/15/2015 at 9:10 AM    Note: This dictation was prepared with Dragon dictation along with smaller phrase technology. Any transcriptional errors that result from this process are unintentional.

## 2015-06-19 LAB — CULTURE, BLOOD (ROUTINE X 2)
CULTURE: NO GROWTH
Culture: NO GROWTH

## 2015-07-06 ENCOUNTER — Ambulatory Visit: Payer: BC Managed Care – PPO | Admitting: Nurse Practitioner

## 2015-07-09 ENCOUNTER — Ambulatory Visit (INDEPENDENT_AMBULATORY_CARE_PROVIDER_SITE_OTHER): Payer: BC Managed Care – PPO | Admitting: Nurse Practitioner

## 2015-07-09 ENCOUNTER — Encounter: Payer: Self-pay | Admitting: Nurse Practitioner

## 2015-07-09 VITALS — BP 148/82 | HR 87 | Temp 98.6°F | Resp 14 | Ht 62.0 in | Wt 194.4 lb

## 2015-07-09 DIAGNOSIS — Z418 Encounter for other procedures for purposes other than remedying health state: Secondary | ICD-10-CM

## 2015-07-09 DIAGNOSIS — F411 Generalized anxiety disorder: Secondary | ICD-10-CM | POA: Diagnosis not present

## 2015-07-09 DIAGNOSIS — Z7189 Other specified counseling: Secondary | ICD-10-CM

## 2015-07-09 DIAGNOSIS — Z23 Encounter for immunization: Secondary | ICD-10-CM | POA: Diagnosis not present

## 2015-07-09 DIAGNOSIS — R300 Dysuria: Secondary | ICD-10-CM

## 2015-07-09 DIAGNOSIS — Z7689 Persons encountering health services in other specified circumstances: Secondary | ICD-10-CM

## 2015-07-09 DIAGNOSIS — Z299 Encounter for prophylactic measures, unspecified: Secondary | ICD-10-CM

## 2015-07-09 LAB — POCT URINALYSIS DIPSTICK
Bilirubin, UA: NEGATIVE
Glucose, UA: NEGATIVE
KETONES UA: NEGATIVE
NITRITE UA: NEGATIVE
PH UA: 6
PROTEIN UA: 30
SPEC GRAV UA: 1.02
Urobilinogen, UA: 1

## 2015-07-09 MED ORDER — BUSPIRONE HCL 5 MG PO TABS: 5.0000 mg | ORAL_TABLET | Freq: Every evening | ORAL | Status: DC | PRN

## 2015-07-09 MED ORDER — CIPROFLOXACIN HCL 250 MG PO TABS: 250.0000 mg | ORAL_TABLET | Freq: Two times a day (BID) | ORAL | Status: DC

## 2015-07-09 NOTE — Progress Notes (Signed)
Patient ID: Ariana LundKelley M Saintvil, female    DOB: 05/13/1971  Age: 44 y.o. MRN: [id number]  CC: Establish Care   HPI Ariana Weeks presents for establishing care and CC of UTI symptoms and anxiety.   1) New Pt info:   Immunizations- unknown tdap, Wants flu vaccine today   Mammogram- 10/2012    Pap- 07/2014, keeping [facility] for IUD care   Eye Exam- 2014  LMP- IUD   2) Chronic Problems-  Depression- post-partum blues, special needs child 1 yo    Snaps easily, Lexapro- HA, never followed up with her   PCP, anergia, Wellbutrin- tried, xanax- intermittently- headache with even half   Nephrolithiasis- sees [facility]  HA- 2-3 days weekly   3) Acute Problems- UTI symptoms- frequency, urgency, back pain, dysuria, and pruritis.    Anxiety- snapping easily recently    History Ariana Weeks has a past medical history of Headache(784.0); Left ureteral calculus; History of kidney stones; Head cold; Kidney stones; Depression; Frequent headaches; and History of frequent urinary tract infections.   She has past surgical history that includes Cesarean section (2005); Ureteral stent placement (1998  (APPROX)); Extracorporeal shock wave lithotripsy (2009); Intrauterine device insertion [facility](MARCH 2012); and Cholecystectomy (09/2012).   Her family history includes Alcohol abuse in her maternal uncle, paternal grandfather, and paternal uncle; Cancer in her father, maternal grandfather, and maternal grandmother; Cancer (age of onset: 4240) in her mother; Cancer (age of onset: 2570) in her maternal aunt.She reports that she quit smoking about 12 months ago. Her smoking use included Cigarettes. She has a 8 pack-year smoking history. She has never used smokeless tobacco. She reports that she drinks alcohol. She reports that she does not use illicit drugs.  Outpatient Prescriptions Prior to Visit  Medication Sig Dispense Refill  . ibuprofen (ADVIL,MOTRIN) 200 MG tablet Take 200 mg by mouth every 6 (six) hours as needed.    Marland Kitchen.  levofloxacin (LEVAQUIN) 750 MG tablet Take 1 tablet (750 mg total) by mouth daily. (Patient not taking: Reported on 07/09/2015) 7 tablet 0   No facility-administered medications prior to visit.    ROS Review of Systems  Objective:  BP 148/82 mmHg  Pulse 87  Temp(Src) 98.6 F (37 C)  Resp 14  Ht 5\' 2"  (1.575 m)  Wt 194 lb 6.4 oz (88.179 kg)  BMI 35.55 kg/m2  SpO2 98%  LMP  (LMP Unknown)  Physical Exam    Assessment & Plan:   Ariana Weeks was seen today for establish care.  Diagnoses and all orders for this visit:  Need for prophylactic measure -     Tdap vaccine greater than or equal to 7yo IM  Dysuria -     POCT Urinalysis Dipstick -     Urine culture  Encounter for immunization  Generalized anxiety disorder  Encounter to establish care  Other orders -     busPIRone (BUSPAR) 5 MG tablet; Take 1 tablet (5 mg total) by mouth at bedtime and may repeat dose one time if needed. -     ciprofloxacin (CIPRO) 250 MG tablet; Take 1 tablet (250 mg total) by mouth 2 (two) times daily. -     Flu Vaccine QUAD 36+ mos IM   I have discontinued Ariana Weeks's ibuprofen and levofloxacin. I am also having her start on busPIRone and ciprofloxacin.  Meds ordered this encounter  Medications  . busPIRone (BUSPAR) 5 MG tablet    Sig: Take 1 tablet (5 mg total) by mouth at bedtime and may repeat dose  one time if needed.    Dispense:  30 tablet    Refill:  0    Order Specific Question:  Supervising Provider    Answer:  Duncan Dull L [[id number]]  . ciprofloxacin (CIPRO) 250 MG tablet    Sig: Take 1 tablet (250 mg total) by mouth 2 (two) times daily.    Dispense:  10 tablet    Refill:  0    Order Specific Question:  Supervising Provider    Answer:  Sherlene Shams [[id number]]     Follow-up: Return in about 4 weeks (around 08/06/2015) for Back pain, medication follow up, knee pain.

## 2015-07-09 NOTE — Patient Instructions (Signed)
[facility] to [facility] Nice to meet you.  We will follow up in 4 weeks for medication check (take 1 at night to see how it affects you)  Cipro for your UTI. We will call with results of your urine culture  Please take a probiotic ( Align, Floraque or Culturelle) while you are on the antibiotic to prevent a serious antibiotic associated diarrhea  Called clostirudium dificile colitis and a vaginal yeast infection.

## 2015-07-09 NOTE — Progress Notes (Signed)
Pre visit review using our clinic review tool, if applicable. No additional management support is needed unless otherwise documented below in the visit note. 

## 2015-07-10 ENCOUNTER — Encounter: Payer: Self-pay | Admitting: Nurse Practitioner

## 2015-07-11 ENCOUNTER — Telehealth: Payer: Self-pay | Admitting: Nurse Practitioner

## 2015-07-11 LAB — URINE CULTURE
Colony Count: NO GROWTH
Organism ID, Bacteria: NO GROWTH

## 2015-07-11 NOTE — Telephone Encounter (Signed)
Caller name: Teddy SpikeKelly Demars  Relationship to patient: patient Can be reached:[phone]  Reason for call: returned call to get lab results.

## 2015-07-17 DIAGNOSIS — R300 Dysuria: Secondary | ICD-10-CM | POA: Insufficient documentation

## 2015-07-17 DIAGNOSIS — F32A Depression, unspecified: Secondary | ICD-10-CM | POA: Insufficient documentation

## 2015-07-17 DIAGNOSIS — Z7689 Persons encountering health services in other specified circumstances: Secondary | ICD-10-CM | POA: Insufficient documentation

## 2015-07-17 DIAGNOSIS — F329 Major depressive disorder, single episode, unspecified: Secondary | ICD-10-CM | POA: Insufficient documentation

## 2015-07-17 DIAGNOSIS — F419 Anxiety disorder, unspecified: Secondary | ICD-10-CM

## 2015-07-17 NOTE — Assessment & Plan Note (Signed)
Pt reports snapping easily, does not have good follow through, has been on lexapro, xanax, and wellbutrin in the past. We discussed counseling and medications. She declined counseling and would like to try busprione 5 mg qhs. Will follow up in 4 weeks

## 2015-07-17 NOTE — Assessment & Plan Note (Signed)
POCT urine concerning for UTI. Will start on Cipro empirically and obtain urine culture. Encouraged probiotics. FU prn worsening/failure to improve.

## 2015-07-17 NOTE — Assessment & Plan Note (Signed)
Discussed acute and chronic issues. Reviewed health maintenance measures, PFSHx, and immunizations. Obtain records.   Pt received Tdap and Flu vaccine today

## 2015-07-18 LAB — CULTURE, FUNGUS WITHOUT SMEAR

## 2015-08-14 ENCOUNTER — Ambulatory Visit (INDEPENDENT_AMBULATORY_CARE_PROVIDER_SITE_OTHER): Payer: BC Managed Care – PPO | Admitting: Nurse Practitioner

## 2015-08-14 VITALS — BP 138/98 | HR 85 | Temp 98.6°F | Resp 16 | Ht 63.0 in | Wt 191.0 lb

## 2015-08-14 DIAGNOSIS — R300 Dysuria: Secondary | ICD-10-CM | POA: Diagnosis not present

## 2015-08-14 DIAGNOSIS — F411 Generalized anxiety disorder: Secondary | ICD-10-CM | POA: Diagnosis not present

## 2015-08-14 DIAGNOSIS — N200 Calculus of kidney: Secondary | ICD-10-CM

## 2015-08-14 LAB — POCT URINALYSIS DIPSTICK
BILIRUBIN UA: NEGATIVE
GLUCOSE UA: NEGATIVE
Ketones, UA: NEGATIVE
Leukocytes, UA: NEGATIVE
NITRITE UA: NEGATIVE
PH UA: 5.5
Protein, UA: NEGATIVE
SPEC GRAV UA: 1.025
Urobilinogen, UA: 0.2

## 2015-08-14 MED ORDER — ALPRAZOLAM 0.25 MG PO TABS: 0.2500 mg | ORAL_TABLET | Freq: Two times a day (BID) | ORAL | Status: DC | PRN

## 2015-08-14 MED ORDER — BUPROPION HCL ER (XL) 150 MG PO TB24: 150.0000 mg | ORAL_TABLET | Freq: Every day | ORAL | Status: DC

## 2015-08-14 NOTE — Patient Instructions (Signed)
[facility] [address] [place], [place] [id number] Hours of Operation Monday - Friday, Unknown  Main: [phone]

## 2015-08-14 NOTE — Progress Notes (Signed)
Patient ID: Ariana Weeks, female    DOB: 1971-05-01  Age: 44 y.o. MRN: [id number]  CC: Follow-up   HPI Ariana Weeks presents for follow up of anxiety and possible nephrolithiasis.   1) Anxiety- Buspar not helpful  Cried a lot over thanksgiving  Stress and overwhelmed  Irritated and depressed   2) Painful urination, right flank pain, pressure with urination  Severe pain she reports Small blood today  Almost every time urinating  Right flank   History Ariana Weeks has a past medical history of Headache(784.0); Left ureteral calculus; History of kidney stones; Head cold; Kidney stones; Depression; Frequent headaches; and History of frequent urinary tract infections.   She has past surgical history that includes Cesarean section (2005); Ureteral stent placement (1998  (APPROX)); Extracorporeal shock wave lithotripsy (2009); Intrauterine device insertion [facility](MARCH 2012); and Cholecystectomy (09/2012).   Her family history includes Alcohol abuse in her maternal uncle, paternal grandfather, and paternal uncle; Cancer in her father, maternal grandfather, and maternal grandmother; Cancer (age of onset: 7740) in her mother; Cancer (age of onset: 7070) in her maternal aunt.She reports that she quit smoking about 13 months ago. Her smoking use included Cigarettes. She has a 8 pack-year smoking history. She has never used smokeless tobacco. She reports that she drinks alcohol. She reports that she does not use illicit drugs.  Outpatient Prescriptions Prior to Visit  Medication Sig Dispense Refill  . busPIRone (BUSPAR) 5 MG tablet Take 1 tablet (5 mg total) by mouth at bedtime and may repeat dose one time if needed. (Patient not taking: Reported on 08/14/2015) 30 tablet 0  . ciprofloxacin (CIPRO) 250 MG tablet Take 1 tablet (250 mg total) by mouth 2 (two) times daily. (Patient not taking: Reported on 08/14/2015) 10 tablet 0   No facility-administered medications prior to visit.    ROS Review of Systems   Constitutional: Positive for fatigue. Negative for fever, chills and diaphoresis.  Respiratory: Negative for chest tightness, shortness of breath and wheezing.   Cardiovascular: Negative for chest pain, palpitations and leg swelling.  Gastrointestinal: Negative for nausea, vomiting and diarrhea.  Genitourinary: Positive for flank pain. Negative for hematuria and difficulty urinating.  Skin: Negative for rash.  Psychiatric/Behavioral: Positive for sleep disturbance. Negative for suicidal ideas. The patient is nervous/anxious.     Objective:  BP 138/98 mmHg  Pulse 85  Temp(Src) 98.6 F (37 C)  Resp 16  Ht 5\' 3"  (1.6 m)  Wt 191 lb (86.637 kg)  BMI 33.84 kg/m2  SpO2 98%  Physical Exam  Constitutional: She is oriented to person, place, and time. She appears well-developed and well-nourished. No distress.  HENT:  Head: Normocephalic and atraumatic.  Right Ear: External ear normal.  Left Ear: External ear normal.  Cardiovascular: Normal rate, regular rhythm and normal heart sounds.  Exam reveals no gallop and no friction rub.   No murmur heard. Pulmonary/Chest: Effort normal and breath sounds normal. No respiratory distress. She has no wheezes. She has no rales. She exhibits no tenderness.  Neurological: She is alert and oriented to person, place, and time. No cranial nerve deficit. She exhibits normal muscle tone. Coordination normal.  Skin: Skin is warm and dry. No rash noted. She is not diaphoretic.  Psychiatric: She has a normal mood and affect. Her behavior is normal. Judgment and thought content normal.   Assessment & Plan:   Ariana Weeks was seen today for follow-up.  Diagnoses and all orders for this visit:  Dysuria -  POCT Urinalysis Dipstick -     DG Abd 1 View; Future  NEPHROLITHIASIS  Generalized anxiety disorder  Other orders -     buPROPion (WELLBUTRIN XL) 150 MG 24 hr tablet; Take 1 tablet (150 mg total) by mouth daily. -     ALPRAZolam (XANAX) 0.25 MG tablet;  Take 1 tablet (0.25 mg total) by mouth 2 (two) times daily as needed for anxiety.   I have discontinued [name]'s busPIRone and ciprofloxacin. I am also having her start on buPROPion and ALPRAZolam.  Meds ordered this encounter  Medications  . buPROPion (WELLBUTRIN XL) 150 MG 24 hr tablet    Sig: Take 1 tablet (150 mg total) by mouth daily.    Dispense:  30 tablet    Refill:  0    Order Specific Question:  Supervising Provider    Answer:  Duncan Dull L [[id number]]  . ALPRAZolam (XANAX) 0.25 MG tablet    Sig: Take 1 tablet (0.25 mg total) by mouth 2 (two) times daily as needed for anxiety.    Dispense:  60 tablet    Refill:  0    Order Specific Question:  Supervising Provider    Answer:  Sherlene Shams [[id number]]     Follow-up: Return in about 4 weeks (around 09/11/2015) for Follow up of medications .

## 2015-08-20 LAB — HM PAP SMEAR: HM PAP: NORMAL

## 2015-08-21 ENCOUNTER — Encounter: Payer: Self-pay | Admitting: Nurse Practitioner

## 2015-08-21 NOTE — Assessment & Plan Note (Signed)
Probable nephrolithiasis due to symptoms and POCT urine. Will obtian KUB.

## 2015-08-21 NOTE — Assessment & Plan Note (Addendum)
Pt feels very stressed out. She is not taking the buspar and doesn't feel it was helpful. Will try Wellbutrin XL. Wil try xanax 0.25 mg. Warned this can be addictive, can be sedating, and must have contract and UDS to continue. Pt verbalized understanding.  Pt declined counseling. FU in 4 weeks

## 2015-08-23 ENCOUNTER — Telehealth: Payer: Self-pay | Admitting: Nurse Practitioner

## 2015-08-23 NOTE — Telephone Encounter (Signed)
Wanted to see how she was doing. Asked her to go ahead and sign up for MyChart. I told her I would call and check in.

## 2015-09-10 ENCOUNTER — Encounter: Payer: Self-pay | Admitting: Nurse Practitioner

## 2015-09-10 ENCOUNTER — Ambulatory Visit (INDEPENDENT_AMBULATORY_CARE_PROVIDER_SITE_OTHER): Payer: BC Managed Care – PPO | Admitting: Nurse Practitioner

## 2015-09-10 VITALS — BP 116/78 | HR 83 | Temp 98.2°F | Resp 16 | Ht 63.0 in | Wt 194.0 lb

## 2015-09-10 DIAGNOSIS — F411 Generalized anxiety disorder: Secondary | ICD-10-CM

## 2015-09-10 DIAGNOSIS — N200 Calculus of kidney: Secondary | ICD-10-CM

## 2015-09-10 MED ORDER — BUPROPION HCL ER (XL) 150 MG PO TB24: 150.0000 mg | ORAL_TABLET | Freq: Every day | ORAL | Status: DC

## 2015-09-10 NOTE — Patient Instructions (Signed)
Try the Wellbutrin for another month. See if we need to stay at the same dosage or go up to the next dose.

## 2015-09-10 NOTE — Assessment & Plan Note (Signed)
Passed a stone. Feels okay at this time.  Asked her to try lemon in her water and drinks  Stay away from tea  FU prn worsening/failure to improve.

## 2015-09-10 NOTE — Progress Notes (Signed)
Patient ID: Ariana Weeks, female    DOB: [birth year]  Age: 45 y.o. MRN: [id number]  CC: Medication Refill   HPI Ariana Weeks presents for follow up of medication.  1) Refill of Wellbutrin requested Working, feels in a better mood Taking every morning   2) Passed her stone on 08/18/15 broke into 4 parts- largest fit into center of a dime she reports  History Ariana Weeks has a past medical history of Headache(784.0); Left ureteral calculus; History of kidney stones; Head cold; Kidney stones; Depression; Frequent headaches; and History of frequent urinary tract infections.   She has past surgical history that includes Cesarean section (2005); Ureteral stent placement (1998  (APPROX)); Extracorporeal shock wave lithotripsy (2009); Intrauterine device insertion [facility](MARCH 2012); and Cholecystectomy (09/2012).   Her family history includes Alcohol abuse in her maternal uncle, paternal grandfather, and paternal uncle; Cancer in her father, maternal grandfather, and maternal grandmother; Cancer (age of onset: 2540) in her mother; Cancer (age of onset: 4470) in her maternal aunt.She reports that she quit smoking about 14 months ago. Her smoking use included Cigarettes. She has a 8 pack-year smoking history. She has never used smokeless tobacco. She reports that she drinks alcohol. She reports that she does not use illicit drugs.  Outpatient Prescriptions Prior to Visit  Medication Sig Dispense Refill  . ALPRAZolam (XANAX) 0.25 MG tablet Take 1 tablet (0.25 mg total) by mouth 2 (two) times daily as needed for anxiety. 60 tablet 0  . buPROPion (WELLBUTRIN XL) 150 MG 24 hr tablet Take 1 tablet (150 mg total) by mouth daily. 30 tablet 0   No facility-administered medications prior to visit.    ROS Review of Systems  Constitutional: Negative for fever, chills, diaphoresis and fatigue.  Respiratory: Negative for chest tightness, shortness of breath and wheezing.   Cardiovascular: Negative for chest pain,  palpitations and leg swelling.  Gastrointestinal: Negative for nausea, vomiting and diarrhea.  Genitourinary: Positive for hematuria and flank pain.       Nephrolithiasis history- recently passed stone  Skin: Negative for rash.  Neurological: Negative for dizziness, numbness and headaches.  Psychiatric/Behavioral: Negative for suicidal ideas and sleep disturbance. The patient is nervous/anxious.     Objective:  BP 116/78 mmHg  Pulse 83  Temp(Src) 98.2 F (36.8 C) (Oral)  Resp 16  Ht 5\' 3"  (1.6 m)  Wt 194 lb (87.998 kg)  BMI 34.37 kg/m2  SpO2 95%  Physical Exam  Constitutional: She is oriented to person, place, and time. She appears well-developed and well-nourished. No distress.  HENT:  Head: Normocephalic and atraumatic.  Right Ear: External ear normal.  Left Ear: External ear normal.  Eyes: EOM are normal. Pupils are equal, round, and reactive to light. Right eye exhibits no discharge. Left eye exhibits no discharge. No scleral icterus.  Cardiovascular: Normal rate, regular rhythm and normal heart sounds.  Exam reveals no gallop and no friction rub.   No murmur heard. Pulmonary/Chest: Effort normal and breath sounds normal. No respiratory distress. She has no wheezes. She has no rales. She exhibits no tenderness.  Neurological: She is alert and oriented to person, place, and time. No cranial nerve deficit. She exhibits normal muscle tone. Coordination normal.  Skin: Skin is warm and dry. No rash noted. She is not diaphoretic.  Psychiatric: She has a normal mood and affect. Her behavior is normal. Judgment and thought content normal.   Assessment & Plan:   Ariana Weeks was seen today for medication refill.  Diagnoses and all  orders for this visit:  Generalized anxiety disorder  NEPHROLITHIASIS  Other orders -     buPROPion (WELLBUTRIN XL) 150 MG 24 hr tablet; Take 1 tablet (150 mg total) by mouth daily.  I am having Ariana Weeks maintain her ALPRAZolam and buPROPion.  Meds  ordered this encounter  Medications  . buPROPion (WELLBUTRIN XL) 150 MG 24 hr tablet    Sig: Take 1 tablet (150 mg total) by mouth daily.    Dispense:  30 tablet    Refill:  0    Order Specific Question:  Supervising Provider    Answer:  Sherlene Shams [[id number]]     Follow-up: Return if symptoms worsen or fail to improve.

## 2015-09-10 NOTE — Assessment & Plan Note (Signed)
Improved on wellbutrin XL Mood has improved Wants to continue for another month on same dosage before trying higher dosage. She is to call and let me know what she wants to do next month. If we up it to 300 mg she verbalized understanding that she will have to follow up in 4 weeks from upping it.   FU prn worsening/failure to improve.

## 2015-09-13 LAB — HM MAMMOGRAPHY: HM MAMMO: NEGATIVE

## 2015-10-25 ENCOUNTER — Encounter: Payer: Self-pay | Admitting: Nurse Practitioner

## 2015-10-26 ENCOUNTER — Other Ambulatory Visit: Payer: Self-pay

## 2015-10-26 ENCOUNTER — Other Ambulatory Visit: Payer: Self-pay | Admitting: Nurse Practitioner

## 2015-10-26 MED ORDER — BUPROPION HCL ER (XL) 150 MG PO TB24: 150.0000 mg | ORAL_TABLET | Freq: Every day | ORAL | Status: DC

## 2015-10-26 NOTE — Telephone Encounter (Signed)
Pt is requesting a refill on her Wellbutrin 150mg . Pt states that she wants to continue on the dosage she is currently taking. Please advise okay to refill, thanks

## 2015-11-23 ENCOUNTER — Encounter: Payer: Self-pay | Admitting: Family Medicine

## 2015-11-23 ENCOUNTER — Ambulatory Visit (INDEPENDENT_AMBULATORY_CARE_PROVIDER_SITE_OTHER): Payer: BC Managed Care – PPO | Admitting: Family Medicine

## 2015-11-23 VITALS — BP 136/84 | HR 91 | Temp 98.2°F | Ht 63.0 in | Wt 202.8 lb

## 2015-11-23 DIAGNOSIS — J0110 Acute frontal sinusitis, unspecified: Secondary | ICD-10-CM | POA: Diagnosis not present

## 2015-11-23 MED ORDER — BENZONATATE 200 MG PO CAPS: 200.0000 mg | ORAL_CAPSULE | Freq: Two times a day (BID) | ORAL | Status: DC | PRN

## 2015-11-23 MED ORDER — AMOXICILLIN-POT CLAVULANATE 875-125 MG PO TABS
1.0000 | ORAL_TABLET | Freq: Two times a day (BID) | ORAL | Status: DC
Start: 2015-11-23 — End: 2016-05-29

## 2015-11-23 NOTE — Progress Notes (Signed)
Patient ID: Ariana Weeks M Fabry, female   DOB: [birth year], 45 y.o.   MRN: [id number]  Marikay AlarEric Lealon Vanputten, MD Phone: [phone]  Ariana Weeks M Taussig is a 45 y.o. female who presents today for same-day visit.  Patient notes 2 weeks of nasal and sinus congestion with postnasal drip and ear discomfort. Notes she feels tired from this. Has had persistent cough. Also notes frontal headache that she is unsure if it sinus pressure or headache. No fevers. No numbness or weakness. No vision changes. She notes she was improving until earlier this week and then she has begun to worsen. She notes discomfort in her chest that is sharp with cough though no other discomfort in her chest. No shortness of breath. She's been trying over-the-counter remedies with little benefit. She does have sick contacts at work.  PMH: Former smoker   ROS see history of present illness  Objective  Physical Exam Filed Vitals:   11/23/15 1601  BP: 136/84  Pulse: 91  Temp: 98.2 F (36.8 C)    BP Readings from Last 3 Encounters:  11/23/15 136/84  09/10/15 116/78  08/14/15 138/98   Wt Readings from Last 3 Encounters:  11/23/15 202 lb 12.8 oz (91.989 kg)  09/10/15 194 lb (87.998 kg)  08/14/15 191 lb (86.637 kg)    Physical Exam  Constitutional: She is well-developed, well-nourished, and in no distress.  HENT:  Head: Normocephalic and atraumatic.  Right Ear: External ear normal.  Left Ear: External ear normal.  Mouth/Throat: Oropharynx is clear and moist. No oropharyngeal exudate.  Normal TMs bilaterally  Eyes: Conjunctivae are normal. Pupils are equal, round, and reactive to light.  Neck: Neck supple.  Cardiovascular: Normal rate, regular rhythm and normal heart sounds.  Exam reveals no gallop and no friction rub.   No murmur heard. Pulmonary/Chest: Effort normal and breath sounds normal. No respiratory distress. She has no wheezes. She has no rales.  Lymphadenopathy:    She has no cervical adenopathy.  Neurological: She  is alert.  CN 2-12 intact, 5/5 strength in bilateral biceps, triceps, grip, quads, hamstrings, plantar and dorsiflexion, sensation to light touch intact in bilateral UE and LE, normal gait, 2+ patellar reflexes  Skin: Skin is warm and dry. She is not diaphoretic.     Assessment/Plan: Please see individual problem list.  Sinusitis, acute frontal Patient's symptoms are most consistent with bacterial sinusitis versus bronchitis. Benign lung exam. Vital signs are stable. We will treat her with Augmentin. Tessalon for cough. Given return precautions.    No orders of the defined types were placed in this encounter.    Meds ordered this encounter  Medications  . benzonatate (TESSALON) 200 MG capsule    Sig: Take 1 capsule (200 mg total) by mouth 2 (two) times daily as needed for cough.    Dispense:  20 capsule    Refill:  0  . amoxicillin-clavulanate (AUGMENTIN) 875-125 MG tablet    Sig: Take 1 tablet by mouth 2 (two) times daily.    Dispense:  14 tablet    Refill:  0    Marikay AlarEric Darrall Strey, MD [facility]

## 2015-11-23 NOTE — Patient Instructions (Signed)
Nice to meet you. You likely have a sinus infection. We will treat this with Augmentin. You can take Tussionex for cough. If you develop chest pain, shortness breath, cough productive of blood, fevers, numbness, weakness, worsening headache, or any new or change in symptoms please seek medical attention.

## 2015-11-23 NOTE — Progress Notes (Signed)
Pre visit review using our clinic review tool, if applicable. No additional management support is needed unless otherwise documented below in the visit note. 

## 2015-11-23 NOTE — Assessment & Plan Note (Signed)
Patient's symptoms are most consistent with bacterial sinusitis versus bronchitis. Benign lung exam. Vital signs are stable. We will treat her with Augmentin. Tessalon for cough. Given return precautions.

## 2016-05-13 ENCOUNTER — Telehealth: Payer: Self-pay | Admitting: Nurse Practitioner

## 2016-05-13 ENCOUNTER — Other Ambulatory Visit: Payer: Self-pay | Admitting: Family Medicine

## 2016-05-13 MED ORDER — BUPROPION HCL ER (XL) 150 MG PO TB24: 150.0000 mg | ORAL_TABLET | Freq: Every day | ORAL | 0 refills | Status: DC

## 2016-05-13 NOTE — Telephone Encounter (Signed)
Are you okay with a month refill?

## 2016-05-13 NOTE — Telephone Encounter (Signed)
Pt is out of her buPROPion (WELLBUTRIN XL) 150 MG 24 hr tablet. She has an appointment coming up on October 5th to see Dr. Adriana Simasook. Can she can enough medication until then?

## 2016-05-13 NOTE — Telephone Encounter (Signed)
Yes

## 2016-05-13 NOTE — Telephone Encounter (Signed)
refilled 

## 2016-05-29 ENCOUNTER — Ambulatory Visit (INDEPENDENT_AMBULATORY_CARE_PROVIDER_SITE_OTHER): Payer: BC Managed Care – PPO | Admitting: Family Medicine

## 2016-05-29 VITALS — BP 146/102 | HR 115 | Temp 98.2°F | Wt 204.0 lb

## 2016-05-29 DIAGNOSIS — F411 Generalized anxiety disorder: Secondary | ICD-10-CM | POA: Diagnosis not present

## 2016-05-29 DIAGNOSIS — Z23 Encounter for immunization: Secondary | ICD-10-CM

## 2016-05-29 MED ORDER — ALPRAZOLAM 0.25 MG PO TABS: 0.2500 mg | ORAL_TABLET | Freq: Two times a day (BID) | ORAL | 0 refills | Status: DC | PRN

## 2016-05-29 NOTE — Progress Notes (Signed)
Pre visit review using our clinic review tool, if applicable. No additional management support is needed unless otherwise documented below in the visit note. 

## 2016-05-30 NOTE — Progress Notes (Signed)
Subjective:  Patient ID: Ariana Weeks, female    DOB: 04/20/1971  Age: 45 y.o. MRN: [id number]  CC: Follow up anxiety, med refill  HPI:  45 year old female with anxiety presents for follow-up.  GAD  Patient reports that her anxiety has been worsening recently.  Patient states that she is on her regular stress at work, she is a Chartered loss adjusterschoolteacher.  She also has a lot of stressors at home.  She states that recently her anxiety has been worse. She states that she has even cried in the workplace secondary to this.  She is currently on PRN Xanax and Wellbutrin.  She would like to discuss potential medication adjustment or changes today.  Additionally, she is in need of refill on her alprazolam.  Social Hx   Social History   Social History  . Marital status: Married    Spouse name: N/A  . Number of children: 1  . Years of education: N/A   Occupational History  . Special ED Teacher     [employer]   Social History Main Topics  . Smoking status: Former Smoker    Packs/day: 0.50    Years: 16.00    Types: Cigarettes    Quit date: 06/25/2014  . Smokeless tobacco: Never Used  . Alcohol use 0.0 oz/week     Comment: RARE  . Drug use: No  . Sexual activity: Not on file   Other Topics Concern  . Not on file   Social History Narrative   No regular exercise.      Nutrition: 3 meals a day, limited fruits and vegetables, fast meals occasionally      Cell: [phone]      Married   Technical brewermployed Teacher   College education   2 children    Caffeine- 1-2 cups of coke daily   Review of Systems  Constitutional: Negative.   Psychiatric/Behavioral: The patient is nervous/anxious.    Objective:  BP (!) 146/102 (BP Location: Right Arm, Patient Position: Sitting, Cuff Size: Normal)   Pulse (!) 115   Temp 98.2 F (36.8 C) (Oral)   Wt 204 lb (92.5 kg)   SpO2 97%   BMI 36.14 kg/m   BP/Weight 05/29/2016 11/23/2015 09/10/2015  Systolic BP 146 136 116  Diastolic BP 102 84  78  Wt. (Lbs) 204 202.8 194  BMI 36.14 35.93 34.37   Physical Exam  Constitutional: She is oriented to person, place, and time. She appears well-developed. No distress.  Cardiovascular: Normal rate and regular rhythm.   Pulmonary/Chest: Effort normal.  Neurological: She is alert and oriented to person, place, and time.  Psychiatric: She has a normal mood and affect.  Vitals reviewed.  Lab Results  Component Value Date   WBC 9.4 06/13/2015   HGB 14.4 06/13/2015   HCT 41.9 06/13/2015   PLT 172 06/13/2015   GLUCOSE 94 06/13/2015   ALT 14 06/13/2015   AST 17 06/13/2015   NA 137 06/13/2015   K 4.0 06/13/2015   CL 104 06/13/2015   CREATININE 1.21 (H) 06/13/2015   BUN 11 06/13/2015   CO2 25 06/13/2015   TSH 3.644 06/14/2015   HGBA1C 5.4 06/14/2015    Assessment & Plan:   Problem List Items Addressed This Visit    Generalized anxiety disorder    Established problem, worsening. Alprazolam refilled. Patient uses this very sparingly. After discussion, increasing Wellbutrin to 300 mg daily (patient just filled her 150 mg dose so will increase that until she runs out).  Other Visit Diagnoses    Encounter for immunization       Relevant Orders   Flu Vaccine QUAD 36+ mos IM (Completed)     Meds ordered this encounter  Medications  . ALPRAZolam (XANAX) 0.25 MG tablet    Sig: Take 1 tablet (0.25 mg total) by mouth 2 (two) times daily as needed for anxiety.    Dispense:  60 tablet    Refill:  0    Follow-up: Return in about 1 year (around 05/29/2017).  Everlene Other DO [facility]

## 2016-05-30 NOTE — Assessment & Plan Note (Signed)
Established problem, worsening. Alprazolam refilled. Patient uses this very sparingly. After discussion, increasing Wellbutrin to 300 mg daily (patient just filled her 150 mg dose so will increase that until she runs out).

## 2016-06-06 ENCOUNTER — Telehealth: Payer: Self-pay | Admitting: *Deleted

## 2016-06-06 MED ORDER — BUPROPION HCL ER (XL) 150 MG PO TB24: 150.0000 mg | ORAL_TABLET | Freq: Every day | ORAL | 1 refills | Status: DC

## 2016-06-06 NOTE — Telephone Encounter (Signed)
Patient ha requested a Rx refill for bupropion 300 mg , which would be the new MG  Pharmacy [facility] [address]

## 2016-06-06 NOTE — Telephone Encounter (Signed)
rx was sent

## 2016-08-25 DIAGNOSIS — D0500 Lobular carcinoma in situ of unspecified breast: Secondary | ICD-10-CM

## 2016-08-25 HISTORY — PX: BREAST LUMPECTOMY: SHX2

## 2016-08-25 HISTORY — DX: Lobular carcinoma in situ of unspecified breast: D05.00

## 2016-11-18 ENCOUNTER — Other Ambulatory Visit: Payer: BC Managed Care – PPO

## 2017-01-01 ENCOUNTER — Other Ambulatory Visit: Payer: Self-pay | Admitting: Family Medicine

## 2017-01-01 ENCOUNTER — Telehealth: Payer: Self-pay | Admitting: *Deleted

## 2017-01-01 NOTE — Telephone Encounter (Signed)
Last OV was in October 2017, Please advise for refills, no follow up on file.

## 2017-01-01 NOTE — Telephone Encounter (Signed)
Medication Refill requested for :Wellbutrin  Pharmacy:[facility]  Return Contact : [phone]

## 2017-01-01 NOTE — Telephone Encounter (Signed)
Pt has been notified that medication was sent in.

## 2017-01-13 ENCOUNTER — Ambulatory Visit (INDEPENDENT_AMBULATORY_CARE_PROVIDER_SITE_OTHER): Payer: BC Managed Care – PPO | Admitting: Family Medicine

## 2017-01-13 ENCOUNTER — Encounter: Payer: Self-pay | Admitting: Family Medicine

## 2017-01-13 DIAGNOSIS — F329 Major depressive disorder, single episode, unspecified: Secondary | ICD-10-CM | POA: Diagnosis not present

## 2017-01-13 DIAGNOSIS — F419 Anxiety disorder, unspecified: Secondary | ICD-10-CM

## 2017-01-13 DIAGNOSIS — F32A Depression, unspecified: Secondary | ICD-10-CM

## 2017-01-13 MED ORDER — CLONAZEPAM 0.5 MG PO TABS: 0.5000 mg | ORAL_TABLET | Freq: Two times a day (BID) | ORAL | 1 refills | Status: DC | PRN

## 2017-01-13 MED ORDER — BUPROPION HCL ER (XL) 300 MG PO TB24: 300.0000 mg | ORAL_TABLET | Freq: Every day | ORAL | 3 refills | Status: DC

## 2017-01-13 NOTE — Patient Instructions (Signed)
Medications as prescribed.  See the counselor.  Follow up in 6 weeks  Take care  Dr. Adriana Simasook

## 2017-01-14 NOTE — Assessment & Plan Note (Signed)
Established problem, worsening again. Increasing Wellbutrin. Starting on Klonopin. Advised patient see counselor. She has frequent counseling through her work.

## 2017-01-14 NOTE — Progress Notes (Signed)
Subjective:  Patient ID: Ariana Weeks, female    DOB: [birth year]  Age: 46 y.o. MRN: [id number]  CC: Anxiety/depression  HPI:  46 year old female presents for follow-up regarding the above.  Patient continues to struggle with her mood. It has been worsening since December. She is quite overwhelmed at home as she has a disabled son. Lots of stress at home. She is sleep deprived as well as her child does not sleep well. She states that she has little interest in doing things. She is often very anxious as well as angry. Short tempered. Easy to react. She states that she does not feel like herself. She is currently on Wellbutrin. She has not taken Xanax. She would like to discuss treatment options today.  Social Hx   Social History   Social History  . Marital status: Married    Spouse name: N/A  . Number of children: 1  . Years of education: N/A   Occupational History  . Special ED Teacher     [employer]   Social History Main Topics  . Smoking status: Former Smoker    Packs/day: 0.50    Years: 16.00    Types: Cigarettes    Quit date: 06/25/2014  . Smokeless tobacco: Never Used  . Alcohol use 0.0 oz/week     Comment: RARE  . Drug use: No  . Sexual activity: Not Asked   Other Topics Concern  . None   Social History Narrative   No regular exercise.      Nutrition: 3 meals a day, limited fruits and vegetables, fast meals occasionally      Cell: [phone]      Married   Technical brewermployed Teacher   College education   2 children    Caffeine- 1-2 cups of coke daily    Review of Systems  Constitutional: Negative.   Psychiatric/Behavioral:       Anxiety/depression.   Objective:  BP (!) 146/92   Pulse (!) 104   Temp 98.9 F (37.2 C) (Oral)   Resp 16   Ht 5\' 3"  (1.6 m)   Wt 206 lb (93.4 kg)   SpO2 98%   BMI 36.49 kg/m   BP/Weight 01/13/2017 05/29/2016 11/23/2015  Systolic BP 146 146 136  Diastolic BP 92 102 84  Wt. (Lbs) 206 204 202.8  BMI 36.49 36.14 35.93     Physical Exam  Constitutional: She is oriented to person, place, and time. She appears well-developed. No distress.  HENT:  Head: Normocephalic and atraumatic.  Eyes: Conjunctivae are normal. No scleral icterus.  Pulmonary/Chest: Effort normal.  Neurological: She is alert and oriented to person, place, and time.  Psychiatric:  Anxious, tearful at times.  Vitals reviewed.   Lab Results  Component Value Date   WBC 9.4 06/13/2015   HGB 14.4 06/13/2015   HCT 41.9 06/13/2015   PLT 172 06/13/2015   GLUCOSE 94 06/13/2015   ALT 14 06/13/2015   AST 17 06/13/2015   NA 137 06/13/2015   K 4.0 06/13/2015   CL 104 06/13/2015   CREATININE 1.21 (H) 06/13/2015   BUN 11 06/13/2015   CO2 25 06/13/2015   TSH 3.644 06/14/2015   HGBA1C 5.4 06/14/2015    Assessment & Plan:   Problem List Items Addressed This Visit    Anxiety and depression    Established problem, worsening again. Increasing Wellbutrin. Starting on Klonopin. Advised patient see counselor. She has frequent counseling through her work.  Meds ordered this encounter  Medications  . buPROPion (WELLBUTRIN XL) 300 MG 24 hr tablet    Sig: Take 1 tablet (300 mg total) by mouth daily.    Dispense:  90 tablet    Refill:  3  . clonazePAM (KLONOPIN) 0.5 MG tablet    Sig: Take 1 tablet (0.5 mg total) by mouth 2 (two) times daily as needed for anxiety.    Dispense:  60 tablet    Refill:  1    Follow-up: Return in about 6 weeks (around 02/24/2017).  Everlene Other DO [facility]

## 2017-01-20 ENCOUNTER — Other Ambulatory Visit: Payer: Self-pay | Admitting: Family Medicine

## 2017-01-20 DIAGNOSIS — Z1231 Encounter for screening mammogram for malignant neoplasm of breast: Secondary | ICD-10-CM

## 2017-01-28 ENCOUNTER — Ambulatory Visit
Admission: RE | Admit: 2017-01-28 | Discharge: 2017-01-28 | Disposition: A | Discharge status: Home / Self Care | Payer: BC Managed Care – PPO | Source: Ambulatory Visit | Attending: Family Medicine | Admitting: Family Medicine

## 2017-01-28 DIAGNOSIS — Z1231 Encounter for screening mammogram for malignant neoplasm of breast: Secondary | ICD-10-CM | POA: Insufficient documentation

## 2017-01-29 ENCOUNTER — Other Ambulatory Visit: Payer: Self-pay | Admitting: Family Medicine

## 2017-01-29 DIAGNOSIS — R928 Other abnormal and inconclusive findings on diagnostic imaging of breast: Secondary | ICD-10-CM

## 2017-01-29 DIAGNOSIS — R921 Mammographic calcification found on diagnostic imaging of breast: Secondary | ICD-10-CM

## 2017-02-02 ENCOUNTER — Encounter
Admission: EM | Disposition: A | Discharge status: Home / Self Care | Payer: Self-pay | Source: Home / Self Care | Attending: Internal Medicine

## 2017-02-02 ENCOUNTER — Emergency Department: Payer: BC Managed Care – PPO

## 2017-02-02 ENCOUNTER — Telehealth: Payer: Self-pay | Admitting: Urology

## 2017-02-02 ENCOUNTER — Inpatient Hospital Stay: Payer: BC Managed Care – PPO | Admitting: Anesthesiology

## 2017-02-02 ENCOUNTER — Inpatient Hospital Stay
Admission: EM | Admit: 2017-02-02 | Discharge: 2017-02-03 | DRG: 669 | Disposition: A | Discharge status: Home / Self Care | Payer: BC Managed Care – PPO | Attending: Internal Medicine | Admitting: Internal Medicine

## 2017-02-02 ENCOUNTER — Encounter: Payer: Self-pay | Admitting: Emergency Medicine

## 2017-02-02 DIAGNOSIS — N200 Calculus of kidney: Secondary | ICD-10-CM

## 2017-02-02 DIAGNOSIS — Z87440 Personal history of urinary (tract) infections: Secondary | ICD-10-CM | POA: Diagnosis not present

## 2017-02-02 DIAGNOSIS — R1032 Left lower quadrant pain: Secondary | ICD-10-CM | POA: Diagnosis not present

## 2017-02-02 DIAGNOSIS — N201 Calculus of ureter: Secondary | ICD-10-CM | POA: Diagnosis not present

## 2017-02-02 DIAGNOSIS — N179 Acute kidney failure, unspecified: Secondary | ICD-10-CM | POA: Diagnosis present

## 2017-02-02 DIAGNOSIS — Z885 Allergy status to narcotic agent status: Secondary | ICD-10-CM | POA: Diagnosis not present

## 2017-02-02 DIAGNOSIS — Z87442 Personal history of urinary calculi: Secondary | ICD-10-CM | POA: Diagnosis not present

## 2017-02-02 DIAGNOSIS — N132 Hydronephrosis with renal and ureteral calculous obstruction: Principal | ICD-10-CM | POA: Diagnosis present

## 2017-02-02 DIAGNOSIS — E860 Dehydration: Secondary | ICD-10-CM | POA: Diagnosis present

## 2017-02-02 DIAGNOSIS — E669 Obesity, unspecified: Secondary | ICD-10-CM | POA: Diagnosis present

## 2017-02-02 DIAGNOSIS — Z6835 Body mass index (BMI) 35.0-35.9, adult: Secondary | ICD-10-CM | POA: Diagnosis not present

## 2017-02-02 DIAGNOSIS — F329 Major depressive disorder, single episode, unspecified: Secondary | ICD-10-CM | POA: Diagnosis present

## 2017-02-02 DIAGNOSIS — N1330 Unspecified hydronephrosis: Secondary | ICD-10-CM

## 2017-02-02 DIAGNOSIS — Z9049 Acquired absence of other specified parts of digestive tract: Secondary | ICD-10-CM | POA: Diagnosis not present

## 2017-02-02 DIAGNOSIS — F17210 Nicotine dependence, cigarettes, uncomplicated: Secondary | ICD-10-CM | POA: Diagnosis present

## 2017-02-02 DIAGNOSIS — Z881 Allergy status to other antibiotic agents status: Secondary | ICD-10-CM | POA: Diagnosis not present

## 2017-02-02 HISTORY — PX: URETEROSCOPY WITH HOLMIUM LASER LITHOTRIPSY: SHX6645

## 2017-02-02 HISTORY — PX: CYSTOSCOPY W/ RETROGRADES: SHX1426

## 2017-02-02 LAB — CBC
HEMATOCRIT: 44.2 % (ref 35.0–47.0)
HEMOGLOBIN: 15 g/dL (ref 12.0–16.0)
MCH: 30.2 pg (ref 26.0–34.0)
MCHC: 33.9 g/dL (ref 32.0–36.0)
MCV: 89.3 fL (ref 80.0–100.0)
Platelets: 232 10*3/uL (ref 150–440)
RBC: 4.95 MIL/uL (ref 3.80–5.20)
RDW: 13.7 % (ref 11.5–14.5)
WBC: 11.8 10*3/uL — ABNORMAL HIGH (ref 3.6–11.0)

## 2017-02-02 LAB — URINALYSIS, COMPLETE (UACMP) WITH MICROSCOPIC
BILIRUBIN URINE: NEGATIVE
GLUCOSE, UA: NEGATIVE mg/dL
KETONES UR: NEGATIVE mg/dL
Leukocytes, UA: NEGATIVE
Nitrite: NEGATIVE
PROTEIN: 30 mg/dL — AB
Specific Gravity, Urine: 1.024 (ref 1.005–1.030)
pH: 5 (ref 5.0–8.0)

## 2017-02-02 LAB — POCT PREGNANCY, URINE: Preg Test, Ur: NEGATIVE

## 2017-02-02 LAB — COMPREHENSIVE METABOLIC PANEL
ALBUMIN: 4.1 g/dL (ref 3.5–5.0)
ALT: 14 U/L (ref 14–54)
ANION GAP: 8 (ref 5–15)
AST: 18 U/L (ref 15–41)
Alkaline Phosphatase: 58 U/L (ref 38–126)
BUN: 16 mg/dL (ref 6–20)
CHLORIDE: 104 mmol/L (ref 101–111)
CO2: 24 mmol/L (ref 22–32)
Calcium: 9.5 mg/dL (ref 8.9–10.3)
Creatinine, Ser: 1.29 mg/dL — ABNORMAL HIGH (ref 0.44–1.00)
GFR calc Af Amer: 58 mL/min — ABNORMAL LOW (ref >60)
GFR calc non Af Amer: 50 mL/min — ABNORMAL LOW (ref >60)
GLUCOSE: 113 mg/dL — AB (ref 65–99)
POTASSIUM: 3.8 mmol/L (ref 3.5–5.1)
SODIUM: 136 mmol/L (ref 135–145)
Total Bilirubin: 1 mg/dL (ref 0.3–1.2)
Total Protein: 7.5 g/dL (ref 6.5–8.1)

## 2017-02-02 LAB — LIPASE, BLOOD: LIPASE: 21 U/L (ref 11–51)

## 2017-02-02 SURGERY — CYSTOSCOPY, WITH RETROGRADE PYELOGRAM
Anesthesia: General | Site: Urethra | Wound class: Clean

## 2017-02-02 MED ORDER — ONDANSETRON HCL 4 MG/2ML IJ SOLN
4.0000 mg | Freq: Four times a day (QID) | INTRAMUSCULAR | Status: DC | PRN
  Administered 2017-02-02: 4 mg via INTRAVENOUS
  Filled 2017-02-02: qty 2

## 2017-02-02 MED ORDER — ONDANSETRON HCL 4 MG PO TABS: 4.0000 mg | ORAL_TABLET | Freq: Four times a day (QID) | ORAL | Status: DC | PRN

## 2017-02-02 MED ORDER — ONDANSETRON HCL 4 MG/2ML IJ SOLN
4.0000 mg | Freq: Once | INTRAMUSCULAR | Status: AC
  Administered 2017-02-02: 4 mg via INTRAVENOUS
  Filled 2017-02-02: qty 2

## 2017-02-02 MED ORDER — PHENAZOPYRIDINE HCL 100 MG PO TABS: 200.0000 mg | ORAL_TABLET | Freq: Three times a day (TID) | ORAL | Status: DC | PRN

## 2017-02-02 MED ORDER — ONDANSETRON HCL 4 MG/2ML IJ SOLN
INTRAMUSCULAR | Status: AC
  Filled 2017-02-02: qty 2

## 2017-02-02 MED ORDER — SODIUM CHLORIDE 0.9 % IV BOLUS (SEPSIS)
1000.0000 mL | Freq: Once | INTRAVENOUS | Status: AC
  Administered 2017-02-02: 1000 mL via INTRAVENOUS

## 2017-02-02 MED ORDER — POTASSIUM CHLORIDE IN NACL 20-0.9 MEQ/L-% IV SOLN
INTRAVENOUS | Status: DC
  Administered 2017-02-02 – 2017-02-03 (×2): via INTRAVENOUS
  Filled 2017-02-02 (×4): qty 1000

## 2017-02-02 MED ORDER — KETOROLAC TROMETHAMINE 30 MG/ML IJ SOLN
15.0000 mg | Freq: Once | INTRAMUSCULAR | Status: AC
  Administered 2017-02-02: 15 mg via INTRAVENOUS
  Filled 2017-02-02: qty 1

## 2017-02-02 MED ORDER — ONDANSETRON HCL 4 MG/2ML IJ SOLN
INTRAMUSCULAR | Status: DC | PRN
  Administered 2017-02-02: 4 mg via INTRAVENOUS

## 2017-02-02 MED ORDER — ACETAMINOPHEN 10 MG/ML IV SOLN
INTRAVENOUS | Status: DC | PRN
  Administered 2017-02-02: 1000 mg via INTRAVENOUS

## 2017-02-02 MED ORDER — LACTATED RINGERS IV SOLN
INTRAVENOUS | Status: DC | PRN
  Administered 2017-02-02: 18:00:00 via INTRAVENOUS

## 2017-02-02 MED ORDER — ACETAMINOPHEN 10 MG/ML IV SOLN
INTRAVENOUS | Status: AC
  Filled 2017-02-02: qty 100

## 2017-02-02 MED ORDER — ACETAMINOPHEN 650 MG RE SUPP: 650.0000 mg | Freq: Four times a day (QID) | RECTAL | Status: DC | PRN

## 2017-02-02 MED ORDER — CLONAZEPAM 0.5 MG PO TABS: 0.5000 mg | ORAL_TABLET | Freq: Two times a day (BID) | ORAL | Status: DC | PRN

## 2017-02-02 MED ORDER — MORPHINE SULFATE (PF) 2 MG/ML IV SOLN: 2.0000 mg | INTRAVENOUS | Status: DC | PRN

## 2017-02-02 MED ORDER — ONDANSETRON HCL 4 MG/2ML IJ SOLN: 4.0000 mg | Freq: Once | INTRAMUSCULAR | Status: DC | PRN

## 2017-02-02 MED ORDER — HYDROCODONE-ACETAMINOPHEN 5-325 MG PO TABS: 1.0000 | ORAL_TABLET | ORAL | Status: DC | PRN

## 2017-02-02 MED ORDER — LIDOCAINE HCL (PF) 2 % IJ SOLN
INTRAMUSCULAR | Status: AC
  Filled 2017-02-02: qty 2

## 2017-02-02 MED ORDER — PHENYLEPHRINE HCL 10 MG/ML IJ SOLN
INTRAMUSCULAR | Status: DC | PRN
  Administered 2017-02-02: 50 ug via INTRAVENOUS
  Administered 2017-02-02 (×4): 100 ug via INTRAVENOUS
  Administered 2017-02-02: 50 ug via INTRAVENOUS

## 2017-02-02 MED ORDER — FENTANYL CITRATE (PF) 100 MCG/2ML IJ SOLN: 25.0000 ug | INTRAMUSCULAR | Status: DC | PRN

## 2017-02-02 MED ORDER — MIDAZOLAM HCL 2 MG/2ML IJ SOLN
INTRAMUSCULAR | Status: DC | PRN
  Administered 2017-02-02: 2 mg via INTRAVENOUS

## 2017-02-02 MED ORDER — MIDAZOLAM HCL 2 MG/2ML IJ SOLN
INTRAMUSCULAR | Status: AC
  Filled 2017-02-02: qty 2

## 2017-02-02 MED ORDER — KETOROLAC TROMETHAMINE 30 MG/ML IJ SOLN: 30.0000 mg | Freq: Four times a day (QID) | INTRAMUSCULAR | Status: DC | PRN

## 2017-02-02 MED ORDER — DEXAMETHASONE SODIUM PHOSPHATE 10 MG/ML IJ SOLN
INTRAMUSCULAR | Status: DC | PRN
  Administered 2017-02-02: 10 mg via INTRAVENOUS

## 2017-02-02 MED ORDER — FENTANYL CITRATE (PF) 100 MCG/2ML IJ SOLN
INTRAMUSCULAR | Status: DC | PRN
  Administered 2017-02-02 (×3): 50 ug via INTRAVENOUS

## 2017-02-02 MED ORDER — LIDOCAINE HCL (CARDIAC) 20 MG/ML IV SOLN
INTRAVENOUS | Status: DC | PRN
  Administered 2017-02-02: 50 mg via INTRAVENOUS
  Administered 2017-02-02: 100 mg via INTRAVENOUS

## 2017-02-02 MED ORDER — IOTHALAMATE MEGLUMINE 43 % IV SOLN
INTRAVENOUS | Status: DC | PRN
  Administered 2017-02-02: 5 mL

## 2017-02-02 MED ORDER — BUPROPION HCL ER (XL) 300 MG PO TB24
300.0000 mg | ORAL_TABLET | Freq: Every day | ORAL | Status: DC
  Administered 2017-02-03: 300 mg via ORAL
  Filled 2017-02-02: qty 1

## 2017-02-02 MED ORDER — FENTANYL CITRATE (PF) 100 MCG/2ML IJ SOLN
INTRAMUSCULAR | Status: AC
  Filled 2017-02-02: qty 2

## 2017-02-02 MED ORDER — ALBUTEROL SULFATE (2.5 MG/3ML) 0.083% IN NEBU: 2.5000 mg | INHALATION_SOLUTION | RESPIRATORY_TRACT | Status: DC | PRN

## 2017-02-02 MED ORDER — PROPOFOL 10 MG/ML IV BOLUS
INTRAVENOUS | Status: AC
  Filled 2017-02-02: qty 20

## 2017-02-02 MED ORDER — GLYCOPYRROLATE 0.2 MG/ML IJ SOLN
INTRAMUSCULAR | Status: DC | PRN
  Administered 2017-02-02: 0.2 mg via INTRAVENOUS

## 2017-02-02 MED ORDER — KETOROLAC TROMETHAMINE 30 MG/ML IJ SOLN
30.0000 mg | Freq: Four times a day (QID) | INTRAMUSCULAR | Status: DC | PRN
  Administered 2017-02-02: 30 mg via INTRAVENOUS
  Filled 2017-02-02: qty 1

## 2017-02-02 MED ORDER — PROPOFOL 10 MG/ML IV BOLUS
INTRAVENOUS | Status: DC | PRN
  Administered 2017-02-02: 180 mg via INTRAVENOUS
  Administered 2017-02-02: 20 mg via INTRAVENOUS

## 2017-02-02 MED ORDER — OXYCODONE-ACETAMINOPHEN 5-325 MG PO TABS
1.0000 | ORAL_TABLET | Freq: Four times a day (QID) | ORAL | Status: DC | PRN
  Administered 2017-02-03: 1 via ORAL
  Filled 2017-02-02: qty 1

## 2017-02-02 MED ORDER — POLYETHYLENE GLYCOL 3350 17 G PO PACK: 17.0000 g | PACK | Freq: Every day | ORAL | Status: DC | PRN

## 2017-02-02 MED ORDER — DEXTROSE 5 % IV SOLN
1.0000 g | INTRAVENOUS | Status: DC
  Administered 2017-02-02: 1 g via INTRAVENOUS
  Filled 2017-02-02 (×2): qty 10

## 2017-02-02 MED ORDER — MORPHINE SULFATE (PF) 4 MG/ML IV SOLN
4.0000 mg | Freq: Once | INTRAVENOUS | Status: DC
  Filled 2017-02-02: qty 1

## 2017-02-02 MED ORDER — ACETAMINOPHEN 325 MG PO TABS: 650.0000 mg | ORAL_TABLET | Freq: Four times a day (QID) | ORAL | Status: DC | PRN

## 2017-02-02 SURGICAL SUPPLY — 32 items
BAG DRAIN CYSTO-URO LG1000N (MISCELLANEOUS) ×3 IMPLANT
BASKET LASER NITINOL 1.9FR (BASKET) ×1 IMPLANT
BASKET STONE 3X4X90X16 (MISCELLANEOUS) ×1 IMPLANT
BSKT STON RTRVL 120 1.9FR (BASKET) ×2
BSKT STON RTRVL 90X16 3FR (MISCELLANEOUS) ×2
CATH URETL 5X70 OPEN END (CATHETERS) ×1 IMPLANT
CNTNR SPEC 2.5X3XGRAD LEK (MISCELLANEOUS) ×4
CONRAY 43 FOR UROLOGY 50M (MISCELLANEOUS) ×3 IMPLANT
CONT SPEC 4OZ STER OR WHT (MISCELLANEOUS) ×2
CONT SPEC 4OZ STRL OR WHT (MISCELLANEOUS) ×4
CONTAINER SPEC 2.5X3XGRAD LEK (MISCELLANEOUS) IMPLANT
FIBER LASER 365 (Laser) ×1 IMPLANT
GLOVE BIO SURGEON STRL SZ8 (GLOVE) ×4 IMPLANT
GOWN STRL REUS W/ TWL LRG LVL4 (GOWN DISPOSABLE) ×2 IMPLANT
GOWN STRL REUS W/ TWL XL LVL3 (GOWN DISPOSABLE) ×2 IMPLANT
GOWN STRL REUS W/TWL LRG LVL4 (GOWN DISPOSABLE) ×3
GOWN STRL REUS W/TWL XL LVL3 (GOWN DISPOSABLE) ×3
GUIDEWIRE STR ZIPWIRE 035X150 (MISCELLANEOUS) ×3 IMPLANT
KIT RM TURNOVER CYSTO AR (KITS) ×3 IMPLANT
NS IRRIG 500ML POUR BTL (IV SOLUTION) ×3 IMPLANT
PACK CYSTO AR (MISCELLANEOUS) ×3 IMPLANT
PREP PVP WINGED SPONGE (MISCELLANEOUS) ×3 IMPLANT
SENSORWIRE 0.038 NOT ANGLED (WIRE) ×3
SET CYSTO W/LG BORE CLAMP LF (SET/KITS/TRAYS/PACK) ×3 IMPLANT
SHEATH URETERAL 12FRX35CM (MISCELLANEOUS) ×1 IMPLANT
SOL .9 NS 3000ML IRR  AL (IV SOLUTION) ×1
SOL .9 NS 3000ML IRR AL (IV SOLUTION) ×2
SOL .9 NS 3000ML IRR UROMATIC (IV SOLUTION) ×2 IMPLANT
STENT URET 6FRX24 CONTOUR (STENTS) ×1 IMPLANT
SYRINGE IRR TOOMEY STRL 70CC (SYRINGE) ×3 IMPLANT
WATER STERILE IRR 1000ML POUR (IV SOLUTION) ×3 IMPLANT
WIRE SENSOR 0.038 NOT ANGLED (WIRE) IMPLANT

## 2017-02-02 NOTE — Anesthesia Procedure Notes (Signed)
Procedure Name: LMA Insertion Performed by: Azzure Garabedian Pre-anesthesia Checklist: Patient identified, Patient being monitored, Timeout performed, Emergency Drugs available and Suction available Patient Re-evaluated:Patient Re-evaluated prior to inductionOxygen Delivery Method: Circle system utilized Preoxygenation: Pre-oxygenation with 100% oxygen Intubation Type: IV induction Ventilation: Mask ventilation without difficulty LMA: LMA inserted LMA Size: 3.0 Tube type: Oral Number of attempts: 1 Placement Confirmation: positive ETCO2 and breath sounds checked- equal and bilateral Tube secured with: Tape Dental Injury: Teeth and Oropharynx as per pre-operative assessment        

## 2017-02-02 NOTE — Op Note (Signed)
NAMLoleta Dicker:  Lecuyer, Aryani                 ACCOUNT NO.:  [id number]  MEDICAL RECORD NO.:  [id number]  LOCATION:  [facility]                         FACILITY:  [facility]  PHYSICIAN:  Excell SeltzerJohn J. Annabell HowellsWrenn, M.D.    DATE OF BIRTH:  [birth year]  DATE OF PROCEDURE:  02/02/2017 DATE OF DISCHARGE:                              OPERATIVE REPORT   Patient of Orie FishermanSrikar R Sudini, MD.  PROCEDURE: 1. Cystoscopy with left retrograde pyelogram and interpretation. 2. Left ureteroscopy with holmium laser lithotripsy and stone     extraction with insertion of left double-J stent.  PREOPERATIVE DIAGNOSIS:  An 8 mm left proximal ureteral stone.  POSTOPERATIVE DIAGNOSIS:  An 8 mm left proximal ureteral stone.  SURGEON:  Excell SeltzerJohn J. Annabell HowellsWrenn, M.D.  ANESTHESIA:  General.  SPECIMEN:  Stone fragments.  DRAINS:  A 6-French x 24 cm left double-J stent with tether.  BLOOD LOSS:  None.  COMPLICATIONS:  None.  INDICATIONS:  Ms. Ariana DupreRose is a 46 year old white female with a history of urolithiasis, who presented to emergency room with left flank pain and was found to have an 8 mm left proximal ureteral stone.  Her symptoms were moderately controlled with pain medicine, but it was felt the stone was likely too large to pass and the intervention was indicated.  After reviewing the options, we have elected to proceed with attempted ureteroscopic laser tripsy with probable stent.  FINDINGS AND PROCEDURE:  She had received Rocephin earlier in the day. She was taken to the operating room where general anesthetic was induced.  She was placed in lithotomy position.  Her perineum and genitalia were prepped with Betadine solution, she was draped in usual sterile fashion.  Cystoscopy was performed using the 23-French scope and 30-degree lens.  Examination revealed a normal urethra.  The bladder wall was smooth and pale without tumor, stones, or inflammation.  The ureteral orifices were unremarkable.  The left ureteral orifice was cannulated with  5-French open-end catheter and contrast was instilled.  Left retrograde pyelogram revealed a normal distal ureter with a filling defect in the lower proximal ureter consistent with a stone and proximal hydronephrosis.  A guidewire was then passed to the kidney under fluoroscopic guidance through the open-end catheter and the open-end catheter and cystoscope were removed.  A 12, 14, 35 cm digital access sheath inner core was then passed over the wire and easily traversed to the level of the stone.  I then used a dual-lumen semi-rigid 6.5-French ureteroscope, which easily advanced to the level of the stone.  The stone was then engaged with a 0.365 micron laser fiber, initially at 0.5 watts and 10 hertz, but subsequently 1 watt 10 hertz stone, broke into manageable fragments, which were then removed using a combination of a Nitinol Zero Tip basket and Escape basket.  Once all significant fragments removed and only dust remained in the ureter, inspection revealed no significant ureteral injury with some mucosal irritation, so it was felt a stone was indicated.  The cystoscope was reinserted in the bladder.  The stone fragments were evacuated.  The cystoscope was then reinserted over the wire and a 6- French 24 cm Contour double-J stent with tether was  passed to the kidney under fluoroscopic guidance.  The wire was removed leaving good coil in the kidney and good coil in the bladder.  The bladder was drained and the cystoscope was removed leaving the stent string exiting the urethra. The string was tied close to the meatus and trimmed to approximately 5 inches and tucked vaginally.  The patient was then taken down from lithotomy position.  Her anesthetic was reversed.  She was moved to recovery room in stable condition.  There were no complications.     Excell Seltzer. Annabell Howells, M.D.     JJW/MEDQ  D:  02/02/2017  T:  02/02/2017  Job:  [id number]

## 2017-02-02 NOTE — Anesthesia Post-op Follow-up Note (Cosign Needed)
Anesthesia QCDR form completed.        

## 2017-02-02 NOTE — Anesthesia Preprocedure Evaluation (Signed)
Anesthesia Evaluation  Patient identified by MRN, date of birth, ID band Patient awake    Reviewed: Allergy & Precautions, NPO status , Patient's Chart, lab work & pertinent test results, reviewed documented beta blocker date and time   Airway Mallampati: III  TM Distance: >3 FB     Dental  (+) Chipped   Pulmonary Current Smoker,           Cardiovascular      Neuro/Psych PSYCHIATRIC DISORDERS Depression    GI/Hepatic   Endo/Other    Renal/GU Renal disease     Musculoskeletal   Abdominal   Peds  Hematology   Anesthesia Other Findings Obese.  Reproductive/Obstetrics                             Anesthesia Physical Anesthesia Plan  ASA: III  Anesthesia Plan: General   Post-op Pain Management:    Induction: Intravenous  PONV Risk Score and Plan:   Airway Management Planned: Oral ETT and LMA  Additional Equipment:   Intra-op Plan:   Post-operative Plan:   Informed Consent: I have reviewed the patients History and Physical, chart, labs and discussed the procedure including the risks, benefits and alternatives for the proposed anesthesia with the patient or authorized representative who has indicated his/her understanding and acceptance.     Plan Discussed with: CRNA  Anesthesia Plan Comments:         Anesthesia Quick Evaluation

## 2017-02-02 NOTE — Consult Note (Signed)
Urology Consult  I have been asked to see the patient by Dr. Elpidio Anis, for evaluation and management of left proximal ureteral stone.  Chief Complaint: Left flank pain  History of Present Illness: Ariana Weeks is a 46 y.o. year old with a history of recurrent nephrolithiasis who presented to the emergency room earlier today with severe left flank pain. This has been going on for about 2 weeks with the pain is causing much worse. She also notes associated chills without fevers, nausea. CT scan shows an 8 mm left proximal ureteral stone as well as bilateral nonobstructing stones measuring up to 7 mm.  She is a mild leukocytosis of 11.8. Her creatinine is mildly elevated to 1.29 from her baseline of 0.7.  Her UA is unremarkable other than for blood. She is normotensive non-febrile.Currently, her pain is better controlled and she has been transferred from the emergency room to the floor. She is currently nothing by mouth.  She seen several urologists in the past including Dr. Isabel Caprice, Dr. Evelene Croon, Michiel Cowboy.  She does not see any of them on a regular basis. She touch up of lithotripsy as well as ureteroscopy in the past.  She reports that she passes approximately 3-4 stones a year. She normally has a very high pain tolerance and can work through passing a stone. Her pain today is different and uncontrollable.   Past Medical History:  Diagnosis Date  . Depression   . Frequent headaches   . Head cold   . Headache(784.0)   . History of frequent urinary tract infections   . History of kidney stones   . Kidney stones   . Left ureteral calculus     Past Surgical History:  Procedure Laterality Date  . BREAST BIOPSY Right 02/02/2013   [place] bx/clip-neg  . CESAREAN SECTION  2005  . CHOLECYSTECTOMY  09/2012  . EXTRACORPOREAL SHOCK WAVE LITHOTRIPSY  2009  . INTRAUTERINE DEVICE INSERTION  MARCH 2012   [place]UR[place]  . URETERAL STENT PLACEMENT  1998  (APPROX)   Kidney Stone    Home Medications:   Current Meds  Medication Sig  . buPROPion (WELLBUTRIN XL) 300 MG 24 hr tablet Take 1 tablet (300 mg total) by mouth daily.    Allergies:  Allergies  Allergen Reactions  . Dilaudid [Hydromorphone Hcl] Itching    Red face and itching  . Codeine   . Nitrofurantoin Hives    Macrobid  . Morphine And Related Hives and Rash    Family History  Problem Relation Age of Onset  . Cancer Father        Lung  . Cancer Mother 31       breast age late 15's  . Alcohol abuse Maternal Uncle   . Alcohol abuse Paternal Uncle   . Cancer Maternal Grandmother        Ovarian  . Cancer Maternal Grandfather        Lymphoma  . Cancer Maternal Aunt 6       great aunt breast late 29's  . Alcohol abuse Paternal Grandfather   . Breast cancer Neg Hx     Social History:  reports that she has been smoking Cigarettes.  She has a 1.60 pack-year smoking history. She has never used smokeless tobacco. She reports that she drinks alcohol. She reports that she does not use drugs.  ROS: A complete review of systems was performed.  All systems are negative except for pertinent findings as noted.  Physical Exam:  Vital signs in last 24 hours: Temp:  [98.1 F (36.7 C)-98.2 F (36.8 C)] 98.2 F (36.8 C) (06/11 1531) Pulse Rate:  [82-123] 82 (06/11 1531) Resp:  [18-20] 18 (06/11 1531) BP: (139-149)/(88-99) 141/88 (06/11 1531) SpO2:  [99 %] 99 % (06/11 1531) Weight:  [200 lb (90.7 kg)] 200 lb (90.7 kg) (06/11 1010) Constitutional:  Alert and oriented, No acute distress HEENT: [place] AT, moist mucus membranes.  Trachea midline, no masses Cardiovascular: No clubbing, cyanosis, or edema. Respiratory: Normal respiratory effort, no distress GI: Abdomen is soft, nontender, nondistended, no abdominal masses GU: + mild left CVA tenderness Skin: No rashes, bruises or suspicious lesions Lymph: No cervical or inguinal adenopathy Neurologic: Grossly intact, no focal deficits, moving all 4 extremities Psychiatric: Normal  mood and affect   Laboratory Data:   Recent Labs  02/02/17 1015  WBC 11.8*  HGB 15.0  HCT 44.2    Recent Labs  02/02/17 1015  NA 136  K 3.8  CL 104  CO2 24  GLUCOSE 113*  BUN 16  CREATININE 1.29*  CALCIUM 9.5   No results for input(s): LABPT, INR in the last 72 hours. No results for input(s): LABURIN in the last 72 hours. Results for orders placed or performed in visit on 07/09/15  Urine culture     Status: None   Collection Time: 07/09/15  4:30 PM  Result Value Ref Range Status   Colony Count NO GROWTH  Final   Organism ID, Bacteria NO GROWTH  Final   Component     Latest Ref Rng & Units 02/02/2017  Color, Urine     YELLOW YELLOW (A)  Appearance     CLEAR HAZY (A)  Specific Gravity, Urine     1.005 - 1.030 1.024  pH     5.0 - 8.0 5.0  Glucose     NEGATIVE mg/dL NEGATIVE  Hgb urine dipstick     NEGATIVE LARGE (A)  Bilirubin Urine     NEGATIVE NEGATIVE  Ketones, ur     NEGATIVE mg/dL NEGATIVE  Protein     NEGATIVE mg/dL 30 (A)  Nitrite     NEGATIVE NEGATIVE  Leukocytes, UA     NEGATIVE NEGATIVE  RBC / HPF     0 - 5 RBC/hpf TOO NUMEROUS TO COUNT  WBC, UA     0 - 5 WBC/hpf 0-5  Bacteria, UA     NONE SEEN RARE (A)  Squamous Epithelial / LPF     NONE SEEN 6-30 (A)  Mucous      PRESENT  Budding Yeast      PRESENT  Ca Oxalate Crys, UA      PRESENT    Radiologic Imaging: Ct Renal Stone Study  Result Date: 02/02/2017 CLINICAL DATA:  Patient complains of Left flank pain for two days. No urinary issues. Hx of cholecystectomy, c-sections. Hx of stones. EXAM: CT ABDOMEN AND PELVIS WITHOUT CONTRAST TECHNIQUE: Multidetector CT imaging of the abdomen and pelvis was performed following the standard protocol without IV contrast. COMPARISON:  CT abdomen dated 10/05/2014. FINDINGS: Lower chest: No acute abnormality. Hepatobiliary: No focal liver abnormality is seen. Status post cholecystectomy. No biliary dilatation. Pancreas: Unremarkable. No pancreatic ductal  dilatation or surrounding inflammatory changes. Spleen: Normal in size without focal abnormality. Adrenals/Urinary Tract: 8 x 6 mm stone within the proximal left ureter causing moderate to severe hydronephrosis and associated perinephric edema. Numerous left renal stones, measuring 1-7 mm in size. Scattered, at least 5, right renal stones measuring  1-3 mm in size. No right-sided hydronephrosis. Bladder is decompressed. Stomach/Bowel: Bowel is normal in caliber. No bowel wall thickening or evidence of bowel wall inflammation. Appendix is normal. Vascular/Lymphatic: No significant vascular findings are present. No enlarged abdominal or pelvic lymph nodes. Reproductive: IUD appears appropriately positioned. No adnexal mass or free fluid. Other: No abscess collection seen.  No free intraperitoneal air. Musculoskeletal: No acute or suspicious osseous finding. Superficial soft tissues are unremarkable. IMPRESSION: 1. Obstructing 8 x 6 mm stone within the proximal left ureter, located at the level of the L4 vertebral body, causing moderate to severe left-sided hydronephrosis. 2. Bilateral nephrolithiasis. Electronically Signed   By: Bary Richard M.D.   On: 02/02/2017 12:04    Impression/plan:  46 year old female with 8 mm left proximal obstructing ureteral calculus, poorly controlled pain.  1. Left proximal ureteral stone with hydronephrosis-given the size and location of the stone, she is a fairly low chance of passing this spontaneously. She will likely need intervention. She is currently an inpatient, we have discussed proceeding with intervention in the form of ureteral stent placement, possible left ureteroscopy with Dr. Annabell Howells who is the on-call urologist today. All questions were answered. Risk of the procedure were discussed including risk of bleeding, infection, damage to any structure, pain from stent, need for multiple procedures.  2. Acute kidney injury- likely multifactorial related to unilateral  obstruction and dehydration  3. Left flank pain- as per #1  She can likely be discharged following the procedure or first thing to permanent. We will arrange for outpatient urologic follow-up based on the procedure performed this evening. Patient should be discharged home with pain medication, oxybutynin, and Flomax.    02/02/2017, 4:10 PM  Vanna Scotland,  MD    concerns. Vanna Scotland

## 2017-02-02 NOTE — Discharge Instructions (Signed)

## 2017-02-02 NOTE — ED Notes (Signed)
Admitted at bedside.  

## 2017-02-02 NOTE — ED Notes (Signed)
Pt states hx of lithotripsy x 2 and stent placement x 1. States normally able to pass stones. States has had stones on both sides.

## 2017-02-02 NOTE — ED Triage Notes (Signed)
L lower abd pain x 2 days.

## 2017-02-02 NOTE — Brief Op Note (Signed)
02/02/2017  6:28 PM  PATIENT:  Ariana Weeks  46 y.o. female  PRE-OPERATIVE DIAGNOSIS:  8MM STONE  POST-OPERATIVE DIAGNOSIS:  8MM STONE  PROCEDURE:  Procedure(s): CYSTOSCOPY WITH RETROGRADE PYELOGRAM (N/A) URETEROSCOPY WITH HOLMIUM LASER LITHOTRIPSY (Left)  LEFT URETERAL STENT INSERTION  SURGEON:  Surgeon(s) and Role:    * Bjorn PippinWrenn, Kaycen Whitworth, MD - Primary  PHYSICIAN ASSISTANT:   ASSISTANTS: none   ANESTHESIA:   general  EBL:  Total I/O In: 850 [I.V.:800; IV Piggyback:50] Out: -   BLOOD ADMINISTERED:none  DRAINS: 6 x 24 left JJ stent with tether   LOCAL MEDICATIONS USED:  NONE  SPECIMEN:  Source of Specimen:  ureteral stone fragment  DISPOSITION OF SPECIMEN:  PATHOLOGY  COUNTS:  YES  TOURNIQUET:  * No tourniquets in log *  DICTATION: .Other Dictation: Dictation Number [id number]  PLAN OF CARE: Admit for overnight observation  PATIENT DISPOSITION:  PACU - hemodynamically stable.   Delay start of Pharmacological VTE agent (>24hrs) due to surgical blood loss or risk of bleeding: not applicable

## 2017-02-02 NOTE — ED Notes (Signed)
Pt c/o of L sided belly pain that wraps around to back. States nausea and diarrhea. Hx of stones. States pain 10/10. Pt is alert and oriented.

## 2017-02-02 NOTE — Transfer of Care (Signed)
Immediate Anesthesia Transfer of Care Note  Patient: Ariana Weeks  Procedure(s) Performed: Procedure(s): CYSTOSCOPY WITH RETROGRADE PYELOGRAM (N/A) URETEROSCOPY WITH HOLMIUM LASER LITHOTRIPSY (Left)  Patient Location: PACU  Anesthesia Type:General  Level of Consciousness: sedated  Airway & Oxygen Therapy: Patient Spontanous Breathing and Patient connected to face mask oxygen  Post-op Assessment: Report given to RN and Post -op Vital signs reviewed and stable  Post vital signs: Reviewed  Last Vitals:  Vitals:   02/02/17 1531 02/02/17 1837  BP: (!) 141/88 113/69  Pulse: 82 (!) 110  Resp: 18 14  Temp: 36.8 C 36.6 C    Last Pain:  Vitals:   02/02/17 1531  TempSrc: Oral  PainSc:          Complications: No apparent anesthesia complications

## 2017-02-02 NOTE — ED Notes (Signed)
Pt states last meal/drink was yesterday, has had nothing today.   Pt informed Dr. Apolinar JunesBrandon is in surgery and will come see pt after she is done with surgery. Pt verbalized understanding

## 2017-02-02 NOTE — Telephone Encounter (Signed)
Called the patient left message, she is still in the ED.  Ariana Weeks

## 2017-02-02 NOTE — H&P (Signed)
[facility] - [place] at [facility]   PATIENT NAME: Ariana DickerKelley Weeks    MR#:  [id number]  DATE OF BIRTH:  12/15/1970  DATE OF ADMISSION:  02/02/2017  PRIMARY CARE PHYSICIAN: Tommie Samsook, Jayce G, DO   REQUESTING/REFERRING PHYSICIAN: Dr. Don Perkingveronese  CHIEF COMPLAINT:   Chief Complaint  Patient presents with  . Abdominal Pain    HISTORY OF PRESENT ILLNESS:  Ariana DickerKelley Callaway  is a 46 y.o. female with a known history of Kidney stones presents to the hospital complaining of 3 days of left abdominal and flank pain. Patient has had similar pain in the past where she passed stones and was able to take care of it at home. This episode she tried ibuprofen which she normally takes but pain was unbearable and she had to come to the emergency room. Here she had a CT scan of the abdomen which showed left ureteral stone with severe left hydronephrosis. She did have fever at home although not quantified. Some chills. Urinalysis shows bacteria but no WBCs. Afebrile here. Patient is being admitted for ureteral stent placement. Urology consulted.  PAST MEDICAL HISTORY:   Past Medical History:  Diagnosis Date  . Depression   . Frequent headaches   . Head cold   . Headache(784.0)   . History of frequent urinary tract infections   . History of kidney stones   . Kidney stones   . Left ureteral calculus     PAST SURGICAL HISTORY:   Past Surgical History:  Procedure Laterality Date  . BREAST BIOPSY Right 02/02/2013   [place] bx/clip-neg  . CESAREAN SECTION  2005  . CHOLECYSTECTOMY  09/2012  . EXTRACORPOREAL SHOCK WAVE LITHOTRIPSY  2009  . INTRAUTERINE DEVICE INSERTION  MARCH 2012   MURINA  . URETERAL STENT PLACEMENT  1998  (APPROX)   Kidney Stone    SOCIAL HISTORY:   Social History  Substance Use Topics  . Smoking status: Current Every Day Smoker    Packs/day: 0.10    Years: 16.00    Types: Cigarettes    Last attempt to quit: 06/25/2014  . Smokeless tobacco: Never Used  . Alcohol use 0.0  oz/week     Comment: RARE    FAMILY HISTORY:   Family History  Problem Relation Age of Onset  . Cancer Father        Lung  . Cancer Mother 5640       breast age late 5540's  . Alcohol abuse Maternal Uncle   . Alcohol abuse Paternal Uncle   . Cancer Maternal Grandmother        Ovarian  . Cancer Maternal Grandfather        Lymphoma  . Cancer Maternal Aunt 7970       great aunt breast late 6970's  . Alcohol abuse Paternal Grandfather   . Breast cancer Neg Hx     DRUG ALLERGIES:   Allergies  Allergen Reactions  . Dilaudid [Hydromorphone Hcl] Itching    Red face and itching  . Codeine   . Nitrofurantoin Hives    Macrobid  . Morphine And Related Hives and Rash    REVIEW OF SYSTEMS:   Review of Systems  Constitutional: Positive for malaise/fatigue. Negative for chills, fever and weight loss.  HENT: Negative for hearing loss, nosebleeds and sore throat.   Eyes: Negative for blurred vision, double vision and pain.  Respiratory: Negative for cough, hemoptysis, sputum production, shortness of breath and wheezing.   Cardiovascular: Negative for chest pain, palpitations, orthopnea  and leg swelling.  Gastrointestinal: Positive for abdominal pain and nausea. Negative for constipation, diarrhea, heartburn and vomiting.  Genitourinary: Positive for dysuria. Negative for hematuria.  Musculoskeletal: Negative for back pain, falls, joint pain and myalgias.  Skin: Negative for rash.  Neurological: Positive for weakness. Negative for dizziness, tremors, sensory change, speech change, focal weakness, seizures and headaches.  Endo/Heme/Allergies: Does not bruise/bleed easily.  Psychiatric/Behavioral: Negative for depression and memory loss. The patient is not nervous/anxious.    MEDICATIONS AT HOME:   Prior to Admission medications   Medication Sig Start Date End Date Taking? Authorizing Provider  buPROPion (WELLBUTRIN XL) 300 MG 24 hr tablet Take 1 tablet (300 mg total) by mouth daily.  01/13/17  Yes Cook, Jayce G, DO  clonazePAM (KLONOPIN) 0.5 MG tablet Take 1 tablet (0.5 mg total) by mouth 2 (two) times daily as needed for anxiety. 01/13/17   Tommie Sams, DO     VITAL SIGNS:  Blood pressure (!) 149/88, pulse 82, temperature 98.1 F (36.7 C), temperature source Oral, resp. rate 18, height 5\' 3"  (1.6 m), weight 90.7 kg (200 lb), SpO2 99 %.  PHYSICAL EXAMINATION:  Physical Exam  GENERAL:  46 y.o.-year-old patient lying in the bed with no acute distress.  EYES: Pupils equal, round, reactive to light and accommodation. No scleral icterus. Extraocular muscles intact.  HEENT: Head atraumatic, normocephalic. Oropharynx and nasopharynx clear. No oropharyngeal erythema, moist oral mucosa  NECK:  Supple, no jugular venous distention. No thyroid enlargement, no tenderness.  LUNGS: Normal breath sounds bilaterally, no wheezing, rales, rhonchi. No use of accessory muscles of respiration.  CARDIOVASCULAR: S1, S2 normal. No murmurs, rubs, or gallops.  ABDOMEN: Soft, left abdomen and flank tenderness., nondistended. Bowel sounds present. No organomegaly or mass. EXTREMITIES: No pedal edema, cyanosis, or clubbing. + 2 pedal & radial pulses b/l.   NEUROLOGIC: Cranial nerves II through XII are intact. No focal Motor or sensory deficits appreciated b/l. PSYCHIATRIC: The patient is alert and oriented x 3. Good affect.  SKIN: No obvious rash, lesion, or ulcer.  LABORATORY PANEL:   CBC  Recent Labs Lab 02/02/17 1015  WBC 11.8*  HGB 15.0  HCT 44.2  PLT 232   ------------------------------------------------------------------------------------------------------------------  Chemistries   Recent Labs Lab 02/02/17 1015  NA 136  K 3.8  CL 104  CO2 24  GLUCOSE 113*  BUN 16  CREATININE 1.29*  CALCIUM 9.5  AST 18  ALT 14  ALKPHOS 58  BILITOT 1.0   ------------------------------------------------------------------------------------------------------------------  Cardiac  Enzymes No results for input(s): TROPONINI in the last 168 hours. ------------------------------------------------------------------------------------------------------------------  RADIOLOGY:  Ct Renal Stone Study  Result Date: 02/02/2017 CLINICAL DATA:  Patient complains of Left flank pain for two days. No urinary issues. Hx of cholecystectomy, c-sections. Hx of stones. EXAM: CT ABDOMEN AND PELVIS WITHOUT CONTRAST TECHNIQUE: Multidetector CT imaging of the abdomen and pelvis was performed following the standard protocol without IV contrast. COMPARISON:  CT abdomen dated 10/05/2014. FINDINGS: Lower chest: No acute abnormality. Hepatobiliary: No focal liver abnormality is seen. Status post cholecystectomy. No biliary dilatation. Pancreas: Unremarkable. No pancreatic ductal dilatation or surrounding inflammatory changes. Spleen: Normal in size without focal abnormality. Adrenals/Urinary Tract: 8 x 6 mm stone within the proximal left ureter causing moderate to severe hydronephrosis and associated perinephric edema. Numerous left renal stones, measuring 1-7 mm in size. Scattered, at least 5, right renal stones measuring 1-3 mm in size. No right-sided hydronephrosis. Bladder is decompressed. Stomach/Bowel: Bowel is normal in caliber. No bowel wall thickening  or evidence of bowel wall inflammation. Appendix is normal. Vascular/Lymphatic: No significant vascular findings are present. No enlarged abdominal or pelvic lymph nodes. Reproductive: IUD appears appropriately positioned. No adnexal mass or free fluid. Other: No abscess collection seen.  No free intraperitoneal air. Musculoskeletal: No acute or suspicious osseous finding. Superficial soft tissues are unremarkable. IMPRESSION: 1. Obstructing 8 x 6 mm stone within the proximal left ureter, located at the level of the L4 vertebral body, causing moderate to severe left-sided hydronephrosis. 2. Bilateral nephrolithiasis. Electronically Signed   By: Bary Richard  M.D.   On: 02/02/2017 12:04     IMPRESSION AND PLAN:   * Left ureteral stone with severe left hydronephrosis with possible UTI Will need stent placement in the operating room. Urology consulted. We will start IV antibiotics due to bacteria in the urine. Start IV fluids. Nothing by mouth. Pain medications added.  * Acute kidney injury. Due to left ureteral stone. Start IV fluids. Repeat labs in the morning.  * DVT prophylaxis with SCDs. Lovenox to be started after surgery.  All the records are reviewed and case discussed with ED provider. Management plans discussed with the patient, family and they are in agreement.  CODE STATUS: FULL CODE  TOTAL TIME TAKING CARE OF THIS PATIENT: 40 minutes.   Milagros Loll R M.D on 02/02/2017 at 1:21 PM  Between 7am to 6pm - Pager - [phone]  After 6pm go to [email or web address] - password [email or web address] [facility]  [facility] [place] [facility]  Office  [phone]  CC: Primary care physician; Tommie Sams, DO  Note: This dictation was prepared with Dragon dictation along with smaller phrase technology. Any transcriptional errors that result from this process are unintentional.

## 2017-02-02 NOTE — ED Notes (Signed)
Pt taken to CT via stretcher. Will administer medications when pt returns.

## 2017-02-02 NOTE — Telephone Encounter (Signed)
-----   Message from Bjorn PippinJohn Wrenn, MD sent at 02/02/2017 12:37 PM EDT ----- This patient is in the ER with an 8mm stone on the left.  She may elect outpatient management and if so, she will need to be seen ASAP to arrange therapy.  She may elect to stay in house for stenting.

## 2017-02-02 NOTE — Anesthesia Postprocedure Evaluation (Signed)
Anesthesia Post Note  Patient: Ariana Weeks  Procedure(s) Performed: Procedure(s) (LRB): CYSTOSCOPY WITH RETROGRADE PYELOGRAM (N/A) URETEROSCOPY WITH HOLMIUM LASER LITHOTRIPSY (Left)  Patient location during evaluation: PACU Anesthesia Type: General Level of consciousness: awake and alert Pain management: pain level controlled Vital Signs Assessment: post-procedure vital signs reviewed and stable Respiratory status: spontaneous breathing, nonlabored ventilation, respiratory function stable and patient connected to nasal cannula oxygen Cardiovascular status: blood pressure returned to baseline and stable Postop Assessment: no signs of nausea or vomiting Anesthetic complications: no     Last Vitals:  Vitals:   02/02/17 1915 02/02/17 1925  BP:  136/89  Pulse: (!) 103 92  Resp: 13 16  Temp:  36.7 C    Last Pain:  Vitals:   02/02/17 1925  TempSrc: Oral  PainSc:                  [name]

## 2017-02-02 NOTE — Progress Notes (Signed)
Patient transferred to the OR via bed 

## 2017-02-02 NOTE — ED Provider Notes (Signed)
[facility] Emergency Department Provider Note  ____________________________________________  Time seen: Approximately 11:49 AM  I have reviewed the triage vital signs and the nursing notes.   HISTORY  Chief Complaint Abdominal Pain   HPI Ariana Weeks is a 46 y.o. female with a history of multiple kidney stones who presents for evaluation of left-sided abdominal pain. Patient reports 3 days of pain that initially was on her left flank and since yesterday has moved to her left lower quadrant. She reports that her pain is severe, sharp, constant, associated with nausea. She denies vomiting. She reports 12 episodes of diarrhea yesterday however that has resolved. No fever or chills. No dysuria hematuria. She has had 2 C-sections and cholecystectomy in the past. She denies abdominal distention or prior history of SBO. She denies any recent antibiotic use or history of C. difficile.  Past Medical History:  Diagnosis Date  . Depression   . Frequent headaches   . Head cold   . Headache(784.0)   . History of frequent urinary tract infections   . History of kidney stones   . Kidney stones   . Left ureteral calculus     Patient Active Problem List   Diagnosis Date Noted  . Anxiety and depression 07/17/2015    Past Surgical History:  Procedure Laterality Date  . BREAST BIOPSY Right 02/02/2013   [place] bx/clip-neg  . CESAREAN SECTION  2005  . CHOLECYSTECTOMY  09/2012  . EXTRACORPOREAL SHOCK WAVE LITHOTRIPSY  2009  . INTRAUTERINE DEVICE INSERTION  MARCH 2012   MURINA  . URETERAL STENT PLACEMENT  1998  (APPROX)   Kidney Stone    Prior to Admission medications   Medication Sig Start Date End Date Taking? Authorizing Provider  buPROPion (WELLBUTRIN XL) 300 MG 24 hr tablet Take 1 tablet (300 mg total) by mouth daily. 01/13/17  Yes Cook, Jayce G, DO  clonazePAM (KLONOPIN) 0.5 MG tablet Take 1 tablet (0.5 mg total) by mouth 2 (two) times daily as needed for  anxiety. 01/13/17   Tommie Sams, DO    Allergies Dilaudid [hydromorphone hcl]; Codeine; Nitrofurantoin; and Morphine and related  Family History  Problem Relation Age of Onset  . Cancer Father        Lung  . Cancer Mother 21       breast age late 35's  . Alcohol abuse Maternal Uncle   . Alcohol abuse Paternal Uncle   . Cancer Maternal Grandmother        Ovarian  . Cancer Maternal Grandfather        Lymphoma  . Cancer Maternal Aunt 46       great aunt breast late 71's  . Alcohol abuse Paternal Grandfather   . Breast cancer Neg Hx     Social History Social History  Substance Use Topics  . Smoking status: Current Every Day Smoker    Packs/day: 0.10    Years: 16.00    Types: Cigarettes    Last attempt to quit: 06/25/2014  . Smokeless tobacco: Never Used  . Alcohol use 0.0 oz/week     Comment: RARE    Review of Systems  Constitutional: Negative for fever. Eyes: Negative for visual changes. ENT: Negative for sore throat. Neck: No neck pain  Cardiovascular: Negative for chest pain. Respiratory: Negative for shortness of breath. Gastrointestinal: + LLQ abdominal pain, nausea, and diarrhea. No vomiting Genitourinary: Negative for dysuria. Musculoskeletal: Negative for back pain. Skin: Negative for rash. Neurological: Negative for headaches, weakness  or numbness. Psych: No SI or HI  ____________________________________________   PHYSICAL EXAM:  VITAL SIGNS: ED Triage Vitals  Enc Vitals Group     BP 02/02/17 1009 (!) 139/99     Pulse Rate 02/02/17 1009 (!) 123     Resp 02/02/17 1009 20     Temp 02/02/17 1009 98.1 F (36.7 C)     Temp Source 02/02/17 1009 Oral     SpO2 02/02/17 1009 99 %     Weight 02/02/17 1010 200 lb (90.7 kg)     Height 02/02/17 1010 5\' 3"  (1.6 m)     Head Circumference --      Peak Flow --      Pain Score 02/02/17 1011 10     Pain Loc --      Pain Edu? --      Excl. in GC? --     Constitutional: Alert and oriented. Well appearing  and in no apparent distress. HEENT:      Head: Normocephalic and atraumatic.         Eyes: Conjunctivae are normal. Sclera is non-icteric.       Mouth/Throat: Mucous membranes are moist.       Neck: Supple with no signs of meningismus. Cardiovascular: Tachycardic with regular rhythm. No murmurs, gallops, or rubs. 2+ symmetrical distal pulses are present in all extremities. No JVD. Respiratory: Normal respiratory effort. Lungs are clear to auscultation bilaterally. No wheezes, crackles, or rhonchi.  Gastrointestinal: Soft, ttp over the LLQ, and non distended with positive bowel sounds. No rebound or guarding. Genitourinary: No CVA tenderness. Musculoskeletal: Nontender with normal range of motion in all extremities. No edema, cyanosis, or erythema of extremities. Neurologic: Normal speech and language. Face is symmetric. Moving all extremities. No gross focal neurologic deficits are appreciated. Skin: Skin is warm, dry and intact. No rash noted. Psychiatric: Mood and affect are normal. Speech and behavior are normal.  ____________________________________________   LABS (all labs ordered are listed, but only abnormal results are displayed)  Labs Reviewed  COMPREHENSIVE METABOLIC PANEL - Abnormal; Notable for the following:       Result Value   Glucose, Bld 113 (*)    Creatinine, Ser 1.29 (*)    GFR calc non Af Amer 50 (*)    GFR calc Af Amer 58 (*)    All other components within normal limits  CBC - Abnormal; Notable for the following:    WBC 11.8 (*)    All other components within normal limits  URINALYSIS, COMPLETE (UACMP) WITH MICROSCOPIC - Abnormal; Notable for the following:    Color, Urine YELLOW (*)    APPearance HAZY (*)    Hgb urine dipstick LARGE (*)    Protein, ur 30 (*)    Bacteria, UA RARE (*)    Squamous Epithelial / LPF 6-30 (*)    All other components within normal limits  URINE CULTURE  LIPASE, BLOOD  POC URINE PREG, ED  POCT PREGNANCY, URINE    ____________________________________________  EKG  ED ECG REPORT I, Nita Sicklearolina Shastina Rua, the attending physician, personally viewed and interpreted this ECG.   Normal sinus rhythm, rate of 88, normal intervals, normal axis, no ST elevations or depressions, T-wave inversion and flattening in inferior leads. No prior for comparison ____________________________________________  RADIOLOGY  CT a/p: Obstructing 8 x 6 mm stone within the proximal left ureter, located at the level of the L4 vertebral body, causing moderate to severe left-sided hydronephrosis. 2. Bilateral nephrolithiasis. ____________________________________________   PROCEDURES  Procedure(s) performed: None Procedures Critical Care performed:  None ____________________________________________   INITIAL IMPRESSION / ASSESSMENT AND PLAN / ED COURSE  46 y.o. female with a history of multiple kidney stones who presents for evaluation of left-sided abdominal pain. Patient is well-appearing, in no distress, she is tachycardic however remaining of her vitals are within normal limits, she is tender to palpation on the left lower quadrant with no rebound or guarding. Differential diagnoses including kidney stones versus enteritis versus diverticulitis versus ovarian pathology. Plan for CT abdomen and pelvis, we'll give IV fluids, Zofran, and morphine.    _________________________ 12:44 PM on 02/02/2017 -----------------------------------------  CT showing a by 6 mm stone with moderate to severe left-sided hydronephrosis. Blood work showing acute kidney injury with creatinine of 1.29 (baseline is 0.8). UA with no evidence of infection. Discussed with Dr. Annabell Howells, urologist on call who recommended admission for stent placement. We'll discuss with the hospitalist for admission.  Pertinent labs & imaging results that were available during my care of the patient were reviewed by me and considered in my medical decision making (see  chart for details).    ____________________________________________   FINAL CLINICAL IMPRESSION(S) / ED DIAGNOSES  Final diagnoses:  Kidney stone  AKI (acute kidney injury) (HCC)      NEW MEDICATIONS STARTED DURING THIS VISIT:  New Prescriptions   No medications on file     Note:  This document was prepared using Dragon voice recognition software and may include unintentional dictation errors.    Don Perking, [place], MD 02/02/17 [phone]

## 2017-02-03 ENCOUNTER — Telehealth: Payer: Self-pay | Admitting: Urology

## 2017-02-03 ENCOUNTER — Encounter: Payer: Self-pay | Admitting: Urology

## 2017-02-03 DIAGNOSIS — N201 Calculus of ureter: Secondary | ICD-10-CM

## 2017-02-03 LAB — CBC
HCT: 40.8 % (ref 35.0–47.0)
Hemoglobin: 13.8 g/dL (ref 12.0–16.0)
MCH: 30.6 pg (ref 26.0–34.0)
MCHC: 33.9 g/dL (ref 32.0–36.0)
MCV: 90.2 fL (ref 80.0–100.0)
PLATELETS: 231 10*3/uL (ref 150–440)
RBC: 4.52 MIL/uL (ref 3.80–5.20)
RDW: 13.5 % (ref 11.5–14.5)
WBC: 8.5 10*3/uL (ref 3.6–11.0)

## 2017-02-03 LAB — URINE CULTURE: CULTURE: NO GROWTH

## 2017-02-03 LAB — BASIC METABOLIC PANEL
Anion gap: 5 (ref 5–15)
BUN: 17 mg/dL (ref 6–20)
CO2: 25 mmol/L (ref 22–32)
CREATININE: 1.14 mg/dL — AB (ref 0.44–1.00)
Calcium: 8.9 mg/dL (ref 8.9–10.3)
Chloride: 109 mmol/L (ref 101–111)
GFR, EST NON AFRICAN AMERICAN: 58 mL/min — AB (ref >60)
Glucose, Bld: 212 mg/dL — ABNORMAL HIGH (ref 65–99)
Potassium: 4.3 mmol/L (ref 3.5–5.1)
SODIUM: 139 mmol/L (ref 135–145)

## 2017-02-03 LAB — HIV ANTIBODY (ROUTINE TESTING W REFLEX): HIV SCREEN 4TH GENERATION: NONREACTIVE

## 2017-02-03 MED ORDER — KETOROLAC TROMETHAMINE 10 MG PO TABS: 10.0000 mg | ORAL_TABLET | Freq: Three times a day (TID) | ORAL | 0 refills | Status: DC | PRN

## 2017-02-03 MED ORDER — OXYBUTYNIN CHLORIDE 5 MG PO TABS
5.0000 mg | ORAL_TABLET | Freq: Three times a day (TID) | ORAL | Status: DC
Start: 2017-02-03 — End: 2017-02-03

## 2017-02-03 MED ORDER — OXYBUTYNIN CHLORIDE 5 MG PO TABS: 5.0000 mg | ORAL_TABLET | Freq: Three times a day (TID) | ORAL | Status: DC | PRN

## 2017-02-03 MED ORDER — OXYBUTYNIN CHLORIDE 5 MG PO TABS: 5.0000 mg | ORAL_TABLET | Freq: Three times a day (TID) | ORAL | 0 refills | Status: DC | PRN

## 2017-02-03 MED ORDER — TAMSULOSIN HCL 0.4 MG PO CAPS: 0.4000 mg | ORAL_CAPSULE | Freq: Every day | ORAL | 0 refills | Status: DC

## 2017-02-03 MED ORDER — TAMSULOSIN HCL 0.4 MG PO CAPS
0.4000 mg | ORAL_CAPSULE | Freq: Every day | ORAL | Status: DC
  Administered 2017-02-03: 0.4 mg via ORAL
  Filled 2017-02-03: qty 1

## 2017-02-03 MED ORDER — KETOROLAC TROMETHAMINE 10 MG PO TABS
10.0000 mg | ORAL_TABLET | Freq: Three times a day (TID) | ORAL | Status: DC | PRN
  Filled 2017-02-03: qty 1

## 2017-02-03 NOTE — Telephone Encounter (Signed)
App made I will contact the patient with the new app  Ariana Weeks

## 2017-02-03 NOTE — Telephone Encounter (Signed)
Please schedule this patient outpatient follow-up in 1 month with a [facility] M.D. for follow-up with renal ultrasound prior to this.  Ariana ScotlandAshley Jac Romulus, MD

## 2017-02-03 NOTE — Telephone Encounter (Signed)
-----   Message from Bjorn PippinJohn Wrenn, MD sent at 02/02/2017  6:37 PM EDT ----- I did a ureteroscopy on her on 6/11.   She has a stent with a tether.   She needs to be seen in f/u in a week or two.   ----- Message ----- From: Kipp BroodBell, Tammy M Sent: 02/02/2017   3:58 PM To: Bjorn PippinJohn Wrenn, MD  Left message for the patient to call back, she is still in the ED.  Marcelino DusterMichelle ----- Message ----- From: Bjorn PippinWrenn, John, MD Sent: 02/02/2017  12:37 PM To: Leroy LibmanBua Admin  This patient is in the ER with an 8mm stone on the left.  She may elect outpatient management and if so, she will need to be seen ASAP to arrange therapy.  She may elect to stay in house for stenting.

## 2017-02-03 NOTE — Progress Notes (Signed)
Discharge Instructions reviewed with patient and mother, with patient's permission. Patient verbalized understanding. IV dc'ed, cath intact and dressing applied. Patient has already called to set up follow-up appointments. Patient left the unit with belongings, escorted via wheelchair to car. Patient's mother to drive her home.

## 2017-02-03 NOTE — Telephone Encounter (Signed)
done

## 2017-02-03 NOTE — Discharge Summary (Signed)
[facility] - [place] [facility]   PATIENT NAME: Ariana DickerKelley Stong    MR#:  [id number]  DATE OF BIRTH:  [birth year]  DATE OF ADMISSION:  02/02/2017 ADMITTING PHYSICIAN: Milagros LollSrikar Sudini, MD  DATE OF DISCHARGE: 02/03/17  PRIMARY CARE PHYSICIAN: Tommie Samsook, Jayce G, DO    ADMISSION DIAGNOSIS:  Kidney stone [N20.0] AKI (acute kidney injury) (HCC) [N17.9]  DISCHARGE DIAGNOSIS:  Recurrent left ureterolithiasis s/p Left ureteroscopy with holmium laser lithotripsy and stone     extraction with insertion of left double-J stent.  SECONDARY DIAGNOSIS:   Past Medical History:  Diagnosis Date  . Depression   . Frequent headaches   . Head cold   . Headache(784.0)   . History of frequent urinary tract infections   . History of kidney stones   . Kidney stones   . Left ureteral calculus     HOSPITAL COURSE:  Ariana DickerKelley Gault  is a 46 y.o. female with a known history of Kidney stones presents to the hospital complaining of 3 days of left abdominal and flank pain. Patient has had similar pain in the past where she passed stones and was able to take care of it at home. This episode she tried ibuprofen which she normally takes but pain was unbearable and she had to come to the emergency room. Here she had a CT scan of the abdomen which showed left ureteral stone with severe left hydronephrosis  * Left ureteral stone with severe left hydronephrosis with hematuria -s/p Left ureteroscopy with holmium laser lithotripsy and stone     extraction with insertion of left double-J stent. -recieved IV fluids. - Pain medications added. -flomax daily and prn oxybutinin -no indication for abxs - stent to be removed by pt on Friday. F/u Dr Apolinar Junesbrandon in 1-2 weeks   * Acute kidney injury. Due to left ureteral stone.  -recieved IV fluids. -creat much improved  * DVT prophylaxis with SCDs. D/c home Pt seen by Dr Apolinar JunesBrandon  CONSULTS OBTAINED:  Treatment Team:  Bjorn PippinWrenn, John, MD  DRUG ALLERGIES:    Allergies  Allergen Reactions  . Dilaudid [Hydromorphone Hcl] Itching    Red face and itching  . Codeine   . Nitrofurantoin Hives    Macrobid  . Morphine And Related Hives and Rash    DISCHARGE MEDICATIONS:   Current Discharge Medication List    START taking these medications   Details  ketorolac (TORADOL) 10 MG tablet Take 1 tablet (10 mg total) by mouth every 8 (eight) hours as needed for severe pain. Qty: 20 tablet, Refills: 0    oxybutynin (DITROPAN) 5 MG tablet Take 1 tablet (5 mg total) by mouth 3 (three) times daily as needed for bladder spasms. Qty: 25 tablet, Refills: 0    tamsulosin (FLOMAX) 0.4 MG CAPS capsule Take 1 capsule (0.4 mg total) by mouth daily. Qty: 30 capsule, Refills: 0      CONTINUE these medications which have NOT CHANGED   Details  buPROPion (WELLBUTRIN XL) 300 MG 24 hr tablet Take 1 tablet (300 mg total) by mouth daily. Qty: 90 tablet, Refills: 3    clonazePAM (KLONOPIN) 0.5 MG tablet Take 1 tablet (0.5 mg total) by mouth 2 (two) times daily as needed for anxiety. Qty: 60 tablet, Refills: 1        If you experience worsening of your admission symptoms, develop shortness of breath, life threatening emergency, suicidal or homicidal thoughts you must seek medical attention immediately by calling 911 or calling your MD  immediately  if symptoms less severe.  You Must read complete instructions/literature along with all the possible adverse reactions/side effects for all the Medicines you take and that have been prescribed to you. Take any new Medicines after you have completely understood and accept all the possible adverse reactions/side effects.   Please note  You were cared for by a hospitalist during your hospital stay. If you have any questions about your discharge medications or the care you received while you were in the hospital after you are discharged, you can call the unit and asked to speak with the hospitalist on call if the  hospitalist that took care of you is not available. Once you are discharged, your primary care physician will handle any further medical issues. Please note that NO REFILLS for any discharge medications will be authorized once you are discharged, as it is imperative that you return to your primary care physician (or establish a relationship with a primary care physician if you do not have one) for your aftercare needs so that they can reassess your need for medications and monitor your lab values. Today   SUBJECTIVE   Mild local discomfort   VITAL SIGNS:  Blood pressure 126/64, pulse 75, temperature 97.9 F (36.6 C), temperature source Oral, resp. rate 16, height 5\' 3"  (1.6 m), weight 90.7 kg (200 lb), SpO2 97 %.  I/O:   Intake/Output Summary (Last 24 hours) at 02/03/17 0853 Last data filed at 02/03/17 0741  Gross per 24 hour  Intake             2956 ml  Output              700 ml  Net             2256 ml    PHYSICAL EXAMINATION:  GENERAL:  46 y.o.-year-old patient lying in the bed with no acute distress.  EYES: Pupils equal, round, reactive to light and accommodation. No scleral icterus. Extraocular muscles intact.  HEENT: Head atraumatic, normocephalic. Oropharynx and nasopharynx clear.  NECK:  Supple, no jugular venous distention. No thyroid enlargement, no tenderness.  LUNGS: Normal breath sounds bilaterally, no wheezing, rales,rhonchi or crepitation. No use of accessory muscles of respiration.  CARDIOVASCULAR: S1, S2 normal. No murmurs, rubs, or gallops.  ABDOMEN: Soft, non-tender, non-distended. Bowel sounds present. No organomegaly or mass.  EXTREMITIES: No pedal edema, cyanosis, or clubbing.  NEUROLOGIC: Cranial nerves II through XII are intact. Muscle strength 5/5 in all extremities. Sensation intact. Gait not checked.  PSYCHIATRIC: The patient is alert and oriented x 3.  SKIN: No obvious rash, lesion, or ulcer.   DATA REVIEW:   CBC   Recent Labs Lab 02/03/17 0417   WBC 8.5  HGB 13.8  HCT 40.8  PLT 231    Chemistries   Recent Labs Lab 02/02/17 1015 02/03/17 0417  NA 136 139  K 3.8 4.3  CL 104 109  CO2 24 25  GLUCOSE 113* 212*  BUN 16 17  CREATININE 1.29* 1.14*  CALCIUM 9.5 8.9  AST 18  --   ALT 14  --   ALKPHOS 58  --   BILITOT 1.0  --     Microbiology Results   No results found for this or any previous visit (from the past 240 hour(s)).  RADIOLOGY:  Ct Renal Stone Study  Result Date: 02/02/2017 CLINICAL DATA:  Patient complains of Left flank pain for two days. No urinary issues. Hx of cholecystectomy, c-sections. Hx of stones. EXAM:  CT ABDOMEN AND PELVIS WITHOUT CONTRAST TECHNIQUE: Multidetector CT imaging of the abdomen and pelvis was performed following the standard protocol without IV contrast. COMPARISON:  CT abdomen dated 10/05/2014. FINDINGS: Lower chest: No acute abnormality. Hepatobiliary: No focal liver abnormality is seen. Status post cholecystectomy. No biliary dilatation. Pancreas: Unremarkable. No pancreatic ductal dilatation or surrounding inflammatory changes. Spleen: Normal in size without focal abnormality. Adrenals/Urinary Tract: 8 x 6 mm stone within the proximal left ureter causing moderate to severe hydronephrosis and associated perinephric edema. Numerous left renal stones, measuring 1-7 mm in size. Scattered, at least 5, right renal stones measuring 1-3 mm in size. No right-sided hydronephrosis. Bladder is decompressed. Stomach/Bowel: Bowel is normal in caliber. No bowel wall thickening or evidence of bowel wall inflammation. Appendix is normal. Vascular/Lymphatic: No significant vascular findings are present. No enlarged abdominal or pelvic lymph nodes. Reproductive: IUD appears appropriately positioned. No adnexal mass or free fluid. Other: No abscess collection seen.  No free intraperitoneal air. Musculoskeletal: No acute or suspicious osseous finding. Superficial soft tissues are unremarkable. IMPRESSION: 1.  Obstructing 8 x 6 mm stone within the proximal left ureter, located at the level of the L4 vertebral body, causing moderate to severe left-sided hydronephrosis. 2. Bilateral nephrolithiasis. Electronically Signed   By: Bary Richard M.D.   On: 02/02/2017 12:04     Management plans discussed with the patient, family and they are in agreement.  CODE STATUS:     Code Status Orders        Start     Ordered   02/02/17 1320  Full code  Continuous     02/02/17 1320    Code Status History    Date Active Date Inactive Code Status Order ID Comments User Context   06/14/2015  6:08 AM 06/15/2015  3:01 PM Full Code [id number]  Arnaldo Natal, MD Inpatient      TOTAL TIME TAKING CARE OF THIS PATIENT: 40 minutes.    Yanci Bachtell M.D on 02/03/2017 at 8:53 AM  Between 7am to 6pm - Pager - [phone] After 6pm go to [email or web address] - Social research officer, government  [facility]  Office  [phone]  CC: Primary care physician; Tommie Sams, DO

## 2017-02-03 NOTE — Progress Notes (Signed)
Urology Consult Follow Up  Subjective: Doing well this morning. Please that her stones were addressed. Some flank pain with voiding, otherwise overall improved. Anxious for discharge.  Anti-infectives: Anti-infectives    Start     Dose/Rate Route Frequency Ordered Stop   02/02/17 1400  cefTRIAXone (ROCEPHIN) 1 g in dextrose 5 % 50 mL IVPB  Status:  Discontinued     1 g 100 mL/hr over 30 Minutes Intravenous Every 24 hours 02/02/17 1317 02/03/17 0800      Current Facility-Administered Medications  Medication Dose Route Frequency Provider Last Rate Last Dose  . acetaminophen (TYLENOL) tablet 650 mg  650 mg Oral Q6H PRN Milagros Loll, MD       Or  . acetaminophen (TYLENOL) suppository 650 mg  650 mg Rectal Q6H PRN Sudini, Srikar, MD      . albuterol (PROVENTIL) (2.5 MG/3ML) 0.083% nebulizer solution 2.5 mg  2.5 mg Nebulization Q2H PRN Sudini, Wardell Heath, MD      . buPROPion (WELLBUTRIN XL) 24 hr tablet 300 mg  300 mg Oral Daily Milagros Loll, MD   300 mg at 02/03/17 0750  . clonazePAM (KLONOPIN) tablet 0.5 mg  0.5 mg Oral BID PRN Milagros Loll, MD      . ketorolac (TORADOL) 30 MG/ML injection 30 mg  30 mg Intravenous Q6H PRN Milagros Loll, MD   30 mg at 02/02/17 1935  . ketorolac (TORADOL) tablet 10 mg  10 mg Oral Q8H PRN Enedina Finner, MD      . morphine 4 MG/ML injection 4 mg  4 mg Intravenous Once Don Perking, [place], MD      . ondansetron [facility]) tablet 4 mg  4 mg Oral Q6H PRN Milagros Loll, MD       Or  . ondansetron (ZOFRAN) injection 4 mg  4 mg Intravenous Q6H PRN Milagros Loll, MD   4 mg at 02/02/17 1609  . oxybutynin (DITROPAN) tablet 5 mg  5 mg Oral TID PRN Enedina Finner, MD      . phenazopyridine (PYRIDIUM) tablet 200 mg  200 mg Oral TID WC PRN Bjorn Pippin, MD      . polyethylene glycol (MIRALAX / GLYCOLAX) packet 17 g  17 g Oral Daily PRN Milagros Loll, MD      . tamsulosin (FLOMAX) capsule 0.4 mg  0.4 mg Oral Daily Enedina Finner, MD         Objective: Vital signs in last 24  hours: Temp:  [97.5 F (36.4 C)-98.2 F (36.8 C)] 97.9 F (36.6 C) (06/12 0505) Pulse Rate:  [75-123] 75 (06/12 0505) Resp:  [11-21] 16 (06/12 0505) BP: (111-152)/(64-99) 126/64 (06/12 0505) SpO2:  [94 %-100 %] 97 % (06/12 0505) Weight:  [200 lb (90.7 kg)] 200 lb (90.7 kg) (06/11 1010)  Intake/Output from previous day: 06/11 0701 - 06/12 0700 In: 2687.7 [P.O.:510; I.V.:2127.7; IV Piggyback:50] Out: 700 [Urine:700] Intake/Output this shift: Total I/O In: 268.3 [I.V.:268.3] Out: 0    Physical Exam  Constitutional: She is oriented to person, place, and time and well-developed, well-nourished, and in no distress.  HENT:  Head: Normocephalic and atraumatic.  Cardiovascular: Regular rhythm.   Pulmonary/Chest: Breath sounds normal.  Neurological: She is oriented to person, place, and time.  Skin: Skin is warm.  Psychiatric: Affect and judgment normal.    Lab Results:   Recent Labs  02/02/17 1015 02/03/17 0417  WBC 11.8* 8.5  HGB 15.0 13.8  HCT 44.2 40.8  PLT 232 231   BMET  Recent Labs  02/02/17 1015  02/03/17 0417  NA 136 139  K 3.8 4.3  CL 104 109  CO2 24 25  GLUCOSE 113* 212*  BUN 16 17  CREATININE 1.29* 1.14*  CALCIUM 9.5 8.9   PT/INR No results for input(s): LABPROT, INR in the last 72 hours. ABG No results for input(s): PHART, HCO3 in the last 72 hours.  Invalid input(s): PCO2, PO2  Studies/Results: Ct Renal Stone Study  Result Date: 02/02/2017 CLINICAL DATA:  Patient complains of Left flank pain for two days. No urinary issues. Hx of cholecystectomy, c-sections. Hx of stones. EXAM: CT ABDOMEN AND PELVIS WITHOUT CONTRAST TECHNIQUE: Multidetector CT imaging of the abdomen and pelvis was performed following the standard protocol without IV contrast. COMPARISON:  CT abdomen dated 10/05/2014. FINDINGS: Lower chest: No acute abnormality. Hepatobiliary: No focal liver abnormality is seen. Status post cholecystectomy. No biliary dilatation. Pancreas:  Unremarkable. No pancreatic ductal dilatation or surrounding inflammatory changes. Spleen: Normal in size without focal abnormality. Adrenals/Urinary Tract: 8 x 6 mm stone within the proximal left ureter causing moderate to severe hydronephrosis and associated perinephric edema. Numerous left renal stones, measuring 1-7 mm in size. Scattered, at least 5, right renal stones measuring 1-3 mm in size. No right-sided hydronephrosis. Bladder is decompressed. Stomach/Bowel: Bowel is normal in caliber. No bowel wall thickening or evidence of bowel wall inflammation. Appendix is normal. Vascular/Lymphatic: No significant vascular findings are present. No enlarged abdominal or pelvic lymph nodes. Reproductive: IUD appears appropriately positioned. No adnexal mass or free fluid. Other: No abscess collection seen.  No free intraperitoneal air. Musculoskeletal: No acute or suspicious osseous finding. Superficial soft tissues are unremarkable. IMPRESSION: 1. Obstructing 8 x 6 mm stone within the proximal left ureter, located at the level of the L4 vertebral body, causing moderate to severe left-sided hydronephrosis. 2. Bilateral nephrolithiasis. Electronically Signed   By: Bary RichardStan  Maynard M.D.   On: 02/02/2017 12:04     Assessment: s/p Procedure(s): CYSTOSCOPY WITH RETROGRADE PYELOGRAM URETEROSCOPY WITH HOLMIUM LASER LITHOTRIPSY  Plan: -Remove stent on tether per Dr.Wrenn on Friday -Will arrange for follow-up in 1 month with renal ultrasound just prior to this -Voiding symptoms are reviewed -Discharge home with Flomax/ oxybutynin and pain medications -Patient will likely need 24-hour urine metabolic workup which can be arranged at her follow-up -All questions answered  Case discussed with dr. Allena KatzPatel this AM   LOS: 1 day    Vanna ScotlandAshley Nailea Whitehorn 02/03/2017

## 2017-02-04 ENCOUNTER — Ambulatory Visit: Payer: Self-pay

## 2017-02-09 ENCOUNTER — Other Ambulatory Visit: Payer: Self-pay | Admitting: Family Medicine

## 2017-02-09 ENCOUNTER — Ambulatory Visit
Admission: RE | Admit: 2017-02-09 | Discharge: 2017-02-09 | Disposition: A | Discharge status: Home / Self Care | Payer: BC Managed Care – PPO | Source: Ambulatory Visit | Attending: Family Medicine | Admitting: Family Medicine

## 2017-02-09 DIAGNOSIS — R921 Mammographic calcification found on diagnostic imaging of breast: Secondary | ICD-10-CM

## 2017-02-09 DIAGNOSIS — R928 Other abnormal and inconclusive findings on diagnostic imaging of breast: Secondary | ICD-10-CM | POA: Diagnosis present

## 2017-02-10 ENCOUNTER — Ambulatory Visit: Payer: Self-pay

## 2017-02-12 ENCOUNTER — Ambulatory Visit: Payer: Self-pay

## 2017-02-12 LAB — STONE ANALYSIS
Ca Oxalate,Dihydrate: 5 %
Ca Oxalate,Monohydr.: 80 %
Ca phos cry stone ql IR: 15 %
Stone Weight KSTONE: 5.7 mg

## 2017-02-19 ENCOUNTER — Ambulatory Visit
Admission: RE | Admit: 2017-02-19 | Discharge: 2017-02-19 | Disposition: A | Discharge status: Home / Self Care | Payer: BC Managed Care – PPO | Source: Ambulatory Visit | Attending: Family Medicine | Admitting: Family Medicine

## 2017-02-19 DIAGNOSIS — R928 Other abnormal and inconclusive findings on diagnostic imaging of breast: Secondary | ICD-10-CM

## 2017-02-19 DIAGNOSIS — R921 Mammographic calcification found on diagnostic imaging of breast: Secondary | ICD-10-CM | POA: Diagnosis not present

## 2017-02-19 DIAGNOSIS — N62 Hypertrophy of breast: Secondary | ICD-10-CM | POA: Insufficient documentation

## 2017-02-19 HISTORY — PX: BREAST BIOPSY: SHX20

## 2017-02-20 LAB — SURGICAL PATHOLOGY

## 2017-02-23 ENCOUNTER — Telehealth: Payer: Self-pay

## 2017-02-23 NOTE — Telephone Encounter (Signed)
Spoke with Chyrl CivatteJoann and advised that Dr Adriana Simasook wanted radiologist to arrange referrals.  She verbalized understandings.

## 2017-02-23 NOTE — Telephone Encounter (Signed)
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 Do you want radiologist to  Give results and make  referral  nurse navigotator .  Or do you want to contact patient and make referral ?

## 2017-02-23 NOTE — Telephone Encounter (Signed)
Yes radiologist to arrange.

## 2017-02-24 ENCOUNTER — Telehealth: Payer: Self-pay

## 2017-02-24 NOTE — Telephone Encounter (Signed)
-----   Message from Ple[place] HillJoann Yokley, [place]RA sent at 02/23/2017  3:13 PM EDT ----- Baxter HireKristen, Dr. Clelia CroftShaw has spoken to the patient with results.  The patient has seen Dr. Lemar LivingsByrnett in the past and requested the referral to him.  She has an appointment for Thursday, 7/5 at 4:30.  She is aware of the appointment.  Thank you. Freeman CaldronJoann Yokley, RRA, [facility]

## 2017-02-24 NOTE — Telephone Encounter (Signed)
FYI

## 2017-02-26 ENCOUNTER — Ambulatory Visit (INDEPENDENT_AMBULATORY_CARE_PROVIDER_SITE_OTHER): Payer: BC Managed Care – PPO | Admitting: General Surgery

## 2017-02-26 ENCOUNTER — Inpatient Hospital Stay: Payer: Self-pay

## 2017-02-26 ENCOUNTER — Encounter: Payer: Self-pay | Admitting: General Surgery

## 2017-02-26 VITALS — BP 142/90 | HR 112 | Resp 14 | Ht 63.0 in | Wt 202.0 lb

## 2017-02-26 DIAGNOSIS — N6091 Unspecified benign mammary dysplasia of right breast: Secondary | ICD-10-CM

## 2017-02-26 NOTE — Progress Notes (Signed)
Patient ID: Ariana Weeks, female   DOB: 01-13-1971, 46 y.o.   MRN: [id number]  Chief Complaint  Patient presents with  . Other    HPI Ariana Weeks is a 46 y.o. female.  who presents for a breast evaluation. The most recent mammogram and right breast biopsy was done on 02-19-17.  Patient does perform regular self breast checks and gets regular mammograms done.  She states this was a routine mammogram. Denies breast injury or trauma.  She is here today with her husband, Richard(Ricky) Grothe.  HPI  Past Medical History:  Diagnosis Date  . Depression   . Frequent headaches   . Head cold   . Headache(784.0)   . History of frequent urinary tract infections   . History of kidney stones   . Kidney stones   . Left ureteral calculus     Past Surgical History:  Procedure Laterality Date  . BREAST BIOPSY Right 02/02/2013   [place] bx/clip-neg  . BREAST BIOPSY Right 02/19/2017   affirm, ATYPICAL LOBULAR HYPERPLASIA  . CESAREAN SECTION  2005  . CHOLECYSTECTOMY  09/2012  . CYSTOSCOPY W/ RETROGRADES N/A 02/02/2017   Procedure: CYSTOSCOPY WITH RETROGRADE PYELOGRAM;  Surgeon: Bjorn Pippin, MD;  Location: [facility] ORS;  Service: Urology;  Laterality: N/A;  . EXTRACORPOREAL SHOCK WAVE LITHOTRIPSY  2009  . INTRAUTERINE DEVICE INSERTION  MARCH 2012   MURINA  . URETERAL STENT PLACEMENT  1998  (APPROX)   Kidney Stone  . URETEROSCOPY WITH HOLMIUM LASER LITHOTRIPSY Left 02/02/2017   Procedure: URETEROSCOPY WITH HOLMIUM LASER LITHOTRIPSY;  Surgeon: Bjorn Pippin, MD;  Location: [facility] ORS;  Service: Urology;  Laterality: Left;    Family History  Problem Relation Age of Onset  . Cancer Father        Lung  . Cancer Mother 67       ? breast age late 11's  . Alcohol abuse Maternal Uncle   . Alcohol abuse Paternal Uncle   . Cancer Maternal Grandmother        Ovarian  . Cancer Maternal Grandfather        Lymphoma  . Cancer Maternal Aunt 27       great aunt breast late 38's  . Alcohol abuse Paternal  Grandfather   . Breast cancer Neg Hx     Social History Social History  Substance Use Topics  . Smoking status: Current Every Day Smoker    Packs/day: 0.10    Years: 16.00    Types: Cigarettes    Last attempt to quit: 06/25/2014  . Smokeless tobacco: Never Used  . Alcohol use 0.0 oz/week     Comment: RARE    Allergies  Allergen Reactions  . Dilaudid [Hydromorphone Hcl] Itching    Red face and itching  . Codeine   . Nitrofurantoin Hives    Macrobid  . Morphine And Related Hives and Rash    Current Outpatient Prescriptions  Medication Sig Dispense Refill  . buPROPion (WELLBUTRIN XL) 300 MG 24 hr tablet Take 1 tablet (300 mg total) by mouth daily. 90 tablet 3  . clonazePAM (KLONOPIN) 0.5 MG tablet Take 1 tablet (0.5 mg total) by mouth 2 (two) times daily as needed for anxiety. 60 tablet 1  . ketorolac (TORADOL) 10 MG tablet Take 1 tablet (10 mg total) by mouth every 8 (eight) hours as needed for severe pain. 20 tablet 0  . oxybutynin (DITROPAN) 5 MG tablet Take 1 tablet (5 mg total) by mouth 3 (three) times daily as  needed for bladder spasms. 25 tablet 0  . tamsulosin (FLOMAX) 0.4 MG CAPS capsule Take 1 capsule (0.4 mg total) by mouth daily. 30 capsule 0   No current facility-administered medications for this visit.     Review of Systems Review of Systems  Constitutional: Negative.   Respiratory: Negative.   Cardiovascular: Negative.     Blood pressure (!) 142/90, pulse (!) 112, resp. rate 14, height 5\' 3"  (1.6 m), weight 202 lb (91.6 kg).  Physical Exam Physical Exam  Constitutional: She is oriented to person, place, and time. She appears well-developed and well-nourished.  HENT:  Mouth/Throat: Oropharynx is clear and moist.  Eyes: Conjunctivae are normal. No scleral icterus.  Neck: Neck supple.  Cardiovascular: Regular rhythm and normal heart sounds.  Tachycardia present.   Pulmonary/Chest: Effort normal and breath sounds normal. Right breast exhibits no inverted  nipple, no mass, no nipple discharge, no skin change and no tenderness. Left breast exhibits no inverted nipple, no mass, no nipple discharge, no skin change and no tenderness.    No palpable abnormality of either breast.  Lymphadenopathy:    She has no cervical adenopathy.    She has no axillary adenopathy.       Right: No supraclavicular adenopathy present.       Left: No supraclavicular adenopathy present.  Neurological: She is alert and oriented to person, place, and time.  Skin: Skin is warm and dry.  Psychiatric: Her behavior is normal.    Data Reviewed Screening mammograms dated 01/29/2017 suggested microcalcifications in the upper-outer quadrant of the right breast for which additional views were recommended. The left breast was normal. BI-RADS-0. Additional views of the right breast for obtained on 02/09/2017. A 7 mm area of calcifications was identified with at least one area of layering (milk of calcium). BI-RADS-4. Subsequent stereotactic biopsy dated February 19, 2017 was completed by the radiology service. Pathology review: DIAGNOSIS:  A. BREAST, RIGHT, UPPER OUTER QUADRANT; STEREOTACTIC-GUIDED CORE BIOPSY:  - COLUMNAR CELL CHANGE ASSOCIATED WITH CALCIFICATIONS.  - A FEW FOCI OF ATYPICAL LOBULAR HYPERPLASIA.  - NEGATIVE FOR CARCINOMA.   Ultrasound examination was undertaken to determine if the previous biopsy site is visible for ultrasound localization. No clear-cut biopsy cavity or clip was evident. No images, no charge Wire localization will be required for reexcision.  CBC dated 02/03/2017 showed a hemoglobin of 13.8 with an MCV of 90. White blood cell count 8500. Platelet count 231,000. Basic metabolic panel of the same date showed normal electrolytes, elevated blood sugar of 212, creatinine 1.1 with an estimated GFR of 58. These laboratory studies were obtained when the patient presented with a left proximal renal calculus. Previous blood sugars were  normal.  Assessment  Scattered areas of atypical lobular hyperplasia with calcifications associated with columnar cell change. Mild increased risk for malignancy and upstaging based on wide excision and also for the contralateral breast.    Plan    Indications for reexcision with needle localization was reviewed with the patient and her husband. 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 To accommodate her schedule this will be done next week.     Discussed needle localization with wide excision right breast mass.  HPI, Physical Exam, Assessment and Plan have been scribed under the direction and in the presence of Earline Mayotte, MD. Dorathy Daft, RN  I have completed the exam and reviewed the above documentation for accuracy and completeness.  I agree with the above.  Museum/gallery conservator  has been used and any errors in dictation or transcription are unintentional.  Donnalee CurryJeffrey Shenia Alan, M.D., F.A.C.S.  Earline MayotteByrnett, Alysen Smylie W 02/26/2017, 8:25 PM   [facility].  Nicholes MangoMichele J. Bailey, CMA

## 2017-02-26 NOTE — Patient Instructions (Signed)
Breast Biopsy  A breast biopsy is a procedure in which a sample of suspicious breast tissue is removed from your breast. Following the procedure, the tissue or liquid that is removed from the breast is examined under a microscope to see if cancerous cells are present. You may need a breast biopsy if you have:  · Any undiagnosed breast mass (tumor).  · Nipple abnormalities, dimpling, crusting, or ulcerations.  · Abnormal discharge from the nipple, especially blood.  · Redness, swelling, and pain of the breast.  · Calcium deposits (calcifications) or abnormalities seen on a mammogram, ultrasound results, or MRI results.  · Suspicious changes in the breast seen on your mammogram.    If the breast abnormality is found to be cancerous (malignant), a breast biopsy can help to determine what the best treatment is for you. There are many different types of breast biopsies. Talk with your health care provider about your options and which type is best for you.  Tell a health care provider about:  · Any allergies you have.  · All medicines you are taking, including vitamins, herbs, eye drops, creams, and over-the-counter medicines.  · Any problems you or family members have had with anesthetic medicines.  · Any blood disorders you have.  · Any surgeries you have had.  · Any medical conditions you have.  · Whether you are pregnant or may be pregnant.  What are the risks?  Generally, this is a safe procedure. However, problems may occur, including:  · Bleeding.  · Infection.  · Discomfort. This is temporary.  · Allergic reactions to medicines.  · Bruising and swelling of the breast.  · Alteration in the shape of the breast.  · Damage to other tissues.  · Not finding the lump or abnormality.  · Needing more surgery.    What happens before the procedure?  · Plan to have someone take you home after the procedure.  · Do not use any tobacco products, such as cigarettes, chewing tobacco, and e-cigarettes. If you need help quitting,  ask your health care provider.  · Do not drink alcohol for 24 hours before the procedure.  · Ask your health care provider about:  ? Changing or stopping your regular medicines. This is especially important if you are taking diabetes medicines or blood thinners.  ? Taking medicines such as aspirin and ibuprofen. These medicines can thin your blood. Do not take these medicines before your procedure if your health care provider instructs you not to.  · Wear a good support bra to the procedure.  · Ask your health care provider how your surgical site will be marked or identified.  · You may be given antibiotic medicine to help prevent infection.  · Your health care provider may perform a procedure to place a wire (needle localization) or a seed that gives off radiation (radioactive seed localization) in the breast lump. A mammogram, ultrasound, MRI, or a combination of these techniques will be done during this procedure to identify the location of the breast abnormality. The imaging technique used will depend on the type of biopsy you are having. The wire or seed will help the health care provider locate the lump when performing the biopsy, especially if the lump cannot be felt.  What happens during the procedure?  You may be given one or both of the following:  · A medicine to numb the breast area (local anesthetic).  · A medicine to help you relax (sedative) during the procedure.      The following are the different types of biopsies that can be performed.  Fine-Needle Aspiration  A thin needle will be attached to a syringe and inserted into a breast cyst. Fluid and cells will be removed. This technique is not as common as a core needle biopsy.  Core Needle Biopsy  A wide, hollow needle (core needle) will be inserted into a breast lump multiple times to remove tissue samples or cores.  Stereotactic Biopsy  You will lie face-down on a table. Your breast will pass through an opening in the table and will be gently  compressed into a fixed position. X-ray equipment and a computer will be used to locate the breast lump. The surgeon will use this information to collect several samples of tissue using a needle collection device.  Vacuum-Assisted Biopsy  A small incision (less than ¼ inch) will be made in your breast. A biopsy device that includes a hollow needle and vacuum will be passed through the incision and into the breast tissue. The vacuum will gently draw abnormal breast tissue into the needle to remove it. No stitches (sutures) will be needed. The incision will be covered with a bandage (dressing). In this type of biopsy, a larger tissue sample is removed than in a regular core needle biopsy.  Ultrasound-Guided Core Needle Biopsy  A high-frequency ultrasound will be used to help guide the core needle to the area of the mass or abnormality. An incision will be made to insert the needle. Then tissue samples will be removed.  Surgical Biopsy  This method requires an incision in the breast to remove part or all of the suspicious tissue. After the tissue is removed, the skin over the area will be closed with sutures and covered with a dressing. There are two types of surgical biopsies:  · Incisional biopsy. The surgeon will remove part of the breast lump.  · Excisional biopsy. The surgeon will attempt to remove the whole breast lump or as much of it as possible.    After any of these procedures, the tissue or liquid that was removed will be examined under a microscope.  What happens after the procedure?  · You will be taken to the recovery area. If you are doing well and have no problems, you will be allowed to go home.  · You may notice bruising on your breast. This is normal.  · You may have a pressure dressing applied on your breast for 24-48 hours. A pressure dressing is a bandage that is wrapped tightly around the chest to stop fluid from collecting underneath tissues. You may also be advised to wear a supportive bra  during this time.  · Do not drive for 24 hours if you received a sedative.  This information is not intended to replace advice given to you by your health care provider. Make sure you discuss any questions you have with your health care provider.  Document Released: 08/11/2005 Document Revised: 12/20/2015 Document Reviewed: 05/15/2015  Elsevier Interactive Patient Education © 2018 [employer]

## 2017-02-27 ENCOUNTER — Telehealth: Payer: Self-pay

## 2017-02-27 ENCOUNTER — Other Ambulatory Visit: Payer: Self-pay | Admitting: General Surgery

## 2017-02-27 ENCOUNTER — Ambulatory Visit
Admission: RE | Admit: 2017-02-27 | Discharge: 2017-02-27 | Disposition: A | Discharge status: Home / Self Care | Payer: BC Managed Care – PPO | Source: Ambulatory Visit | Attending: Urology | Admitting: Urology

## 2017-02-27 DIAGNOSIS — N132 Hydronephrosis with renal and ureteral calculous obstruction: Secondary | ICD-10-CM | POA: Diagnosis not present

## 2017-02-27 DIAGNOSIS — N6091 Unspecified benign mammary dysplasia of right breast: Secondary | ICD-10-CM

## 2017-02-27 DIAGNOSIS — N201 Calculus of ureter: Secondary | ICD-10-CM

## 2017-02-27 DIAGNOSIS — N200 Calculus of kidney: Secondary | ICD-10-CM | POA: Diagnosis present

## 2017-02-27 NOTE — Telephone Encounter (Signed)
Notified patient that she will [facility] at [facility] on 03/04/17 at 8:15 am. She will pre admit by phone on 03/02/17. She is aware of location, arrival time, and instructions.

## 2017-03-02 ENCOUNTER — Encounter
Admission: RE | Admit: 2017-03-02 | Discharge: 2017-03-02 | Disposition: A | Discharge status: Home / Self Care | Payer: BC Managed Care – PPO | Source: Ambulatory Visit | Attending: General Surgery | Admitting: General Surgery

## 2017-03-02 NOTE — Patient Instructions (Signed)
  Your procedure is scheduled on: 03/04/17 0815 AM Report to [facility].  Remember: Instructions that are not followed completely may result in serious medical risk, up to and including death, or upon the discretion of your surgeon and anesthesiologist your surgery may need to be rescheduled.    ___X_ 1. Do not eat food or drink liquids after midnight. No gum chewing or hard candies.     ____ 2. No Alcohol for 24 hours before or after surgery.   ____ 3. Do Not Smoke For 24 Hours Prior to Your Surgery.   ____ 4. Bring all medications with you on the day of surgery if instructed.    _X___ 5. Notify your doctor if there is any change in your medical condition     (cold, fever, infections).       Do not wear jewelry, make-up, hairpins, clips or nail polish.  Do not wear lotions, powders, or perfumes. You may wear deodorant.  Do not shave 48 hours prior to surgery. Men may shave face and neck.  Do not bring valuables to the hospital.    [facility] is not responsible for any belongings or valuables.               Contacts, dentures or bridgework may not be worn into surgery.  Leave your suitcase in the car. After surgery it may be brought to your room.  For patients admitted to the hospital, discharge time is determined by your                treatment team.   Patients discharged the day of surgery will not be allowed to drive h    X___ Take these medicines the morning of surgery with A SIP OF WATER:    1. WELLBUTRIN  2. FLOMAX IF WANT  3. CLONAZEPAM IF WANT  4.  5.  6.  ____ Fleet Enema (as directed)   ____ Use CHG Soap as directed  ____ Use inhalers on the day of surgery  ____ Stop metformin 2 days prior to surgery    ____ Take 1/2 of usual insulin dose the night before surgery and none on the morning of surgery.   ____ Stop Coumadin/Plavix/aspirin on   ____ Stop Anti-inflammatories on   ____ Stop supplements until after surgery.    ____ Bring C-Pap to  the hospital.

## 2017-03-04 ENCOUNTER — Ambulatory Visit
Admission: RE | Admit: 2017-03-04 | Discharge: 2017-03-04 | Disposition: A | Discharge status: Home / Self Care | Payer: BC Managed Care – PPO | Source: Ambulatory Visit | Attending: General Surgery | Admitting: General Surgery

## 2017-03-04 ENCOUNTER — Ambulatory Visit: Payer: BC Managed Care – PPO | Admitting: Anesthesiology

## 2017-03-04 ENCOUNTER — Encounter
Admission: RE | Disposition: A | Discharge status: Home / Self Care | Payer: Self-pay | Source: Ambulatory Visit | Attending: General Surgery

## 2017-03-04 ENCOUNTER — Encounter: Payer: Self-pay | Admitting: *Deleted

## 2017-03-04 DIAGNOSIS — Z9049 Acquired absence of other specified parts of digestive tract: Secondary | ICD-10-CM | POA: Diagnosis not present

## 2017-03-04 DIAGNOSIS — Z811 Family history of alcohol abuse and dependence: Secondary | ICD-10-CM | POA: Diagnosis not present

## 2017-03-04 DIAGNOSIS — N6091 Unspecified benign mammary dysplasia of right breast: Secondary | ICD-10-CM

## 2017-03-04 DIAGNOSIS — J449 Chronic obstructive pulmonary disease, unspecified: Secondary | ICD-10-CM | POA: Insufficient documentation

## 2017-03-04 DIAGNOSIS — Z801 Family history of malignant neoplasm of trachea, bronchus and lung: Secondary | ICD-10-CM | POA: Diagnosis not present

## 2017-03-04 DIAGNOSIS — Z87442 Personal history of urinary calculi: Secondary | ICD-10-CM | POA: Insufficient documentation

## 2017-03-04 DIAGNOSIS — Z8041 Family history of malignant neoplasm of ovary: Secondary | ICD-10-CM | POA: Insufficient documentation

## 2017-03-04 DIAGNOSIS — R51 Headache: Secondary | ICD-10-CM | POA: Diagnosis not present

## 2017-03-04 DIAGNOSIS — Z79899 Other long term (current) drug therapy: Secondary | ICD-10-CM | POA: Insufficient documentation

## 2017-03-04 DIAGNOSIS — D0501 Lobular carcinoma in situ of right breast: Secondary | ICD-10-CM | POA: Diagnosis not present

## 2017-03-04 DIAGNOSIS — N62 Hypertrophy of breast: Secondary | ICD-10-CM | POA: Diagnosis not present

## 2017-03-04 DIAGNOSIS — F329 Major depressive disorder, single episode, unspecified: Secondary | ICD-10-CM | POA: Diagnosis not present

## 2017-03-04 DIAGNOSIS — F17210 Nicotine dependence, cigarettes, uncomplicated: Secondary | ICD-10-CM | POA: Diagnosis not present

## 2017-03-04 DIAGNOSIS — Z885 Allergy status to narcotic agent status: Secondary | ICD-10-CM | POA: Insufficient documentation

## 2017-03-04 DIAGNOSIS — Z803 Family history of malignant neoplasm of breast: Secondary | ICD-10-CM | POA: Insufficient documentation

## 2017-03-04 DIAGNOSIS — Z807 Family history of other malignant neoplasms of lymphoid, hematopoietic and related tissues: Secondary | ICD-10-CM | POA: Diagnosis not present

## 2017-03-04 DIAGNOSIS — N6489 Other specified disorders of breast: Secondary | ICD-10-CM | POA: Diagnosis present

## 2017-03-04 DIAGNOSIS — Z888 Allergy status to other drugs, medicaments and biological substances status: Secondary | ICD-10-CM | POA: Insufficient documentation

## 2017-03-04 DIAGNOSIS — Z87440 Personal history of urinary (tract) infections: Secondary | ICD-10-CM | POA: Diagnosis not present

## 2017-03-04 HISTORY — PX: BREAST BIOPSY: SHX20

## 2017-03-04 LAB — POCT PREGNANCY, URINE: Preg Test, Ur: NEGATIVE

## 2017-03-04 SURGERY — BREAST BIOPSY WITH NEEDLE LOCALIZATION
Anesthesia: General | Laterality: Right

## 2017-03-04 MED ORDER — EPHEDRINE SULFATE 50 MG/ML IJ SOLN
INTRAMUSCULAR | Status: AC
  Filled 2017-03-04: qty 1

## 2017-03-04 MED ORDER — LIDOCAINE HCL (PF) 2 % IJ SOLN
INTRAMUSCULAR | Status: AC
  Filled 2017-03-04: qty 2

## 2017-03-04 MED ORDER — PROPOFOL 10 MG/ML IV BOLUS
INTRAVENOUS | Status: DC | PRN
  Administered 2017-03-04: 180 mg via INTRAVENOUS

## 2017-03-04 MED ORDER — ONDANSETRON HCL 4 MG/2ML IJ SOLN
INTRAMUSCULAR | Status: AC
  Filled 2017-03-04: qty 2

## 2017-03-04 MED ORDER — FENTANYL CITRATE (PF) 100 MCG/2ML IJ SOLN
INTRAMUSCULAR | Status: DC | PRN
  Administered 2017-03-04 (×2): 50 ug via INTRAVENOUS

## 2017-03-04 MED ORDER — ONDANSETRON HCL 4 MG/2ML IJ SOLN
INTRAMUSCULAR | Status: DC | PRN
  Administered 2017-03-04: 4 mg via INTRAVENOUS

## 2017-03-04 MED ORDER — LIDOCAINE HCL (CARDIAC) 20 MG/ML IV SOLN
INTRAVENOUS | Status: DC | PRN
  Administered 2017-03-04: 60 mg via INTRAVENOUS

## 2017-03-04 MED ORDER — FAMOTIDINE 20 MG PO TABS
ORAL_TABLET | ORAL | Status: AC
  Filled 2017-03-04: qty 1

## 2017-03-04 MED ORDER — ACETAMINOPHEN 10 MG/ML IV SOLN
INTRAVENOUS | Status: AC
  Filled 2017-03-04: qty 100

## 2017-03-04 MED ORDER — LACTATED RINGERS IV SOLN
INTRAVENOUS | Status: DC
  Administered 2017-03-04 (×2): via INTRAVENOUS

## 2017-03-04 MED ORDER — BUPIVACAINE-EPINEPHRINE (PF) 0.5% -1:200000 IJ SOLN
INTRAMUSCULAR | Status: AC
  Filled 2017-03-04: qty 30

## 2017-03-04 MED ORDER — EPHEDRINE SULFATE 50 MG/ML IJ SOLN
INTRAMUSCULAR | Status: DC | PRN
  Administered 2017-03-04: 10 mg via INTRAVENOUS

## 2017-03-04 MED ORDER — MIDAZOLAM HCL 2 MG/2ML IJ SOLN
INTRAMUSCULAR | Status: AC
Start: 2017-03-04 — End: ?
  Filled 2017-03-04: qty 2

## 2017-03-04 MED ORDER — HYDROCODONE-ACETAMINOPHEN 5-325 MG PO TABS: 1.0000 | ORAL_TABLET | ORAL | 0 refills | Status: DC | PRN

## 2017-03-04 MED ORDER — SEVOFLURANE IN SOLN
RESPIRATORY_TRACT | Status: AC
  Filled 2017-03-04: qty 250

## 2017-03-04 MED ORDER — DEXAMETHASONE SODIUM PHOSPHATE 10 MG/ML IJ SOLN
INTRAMUSCULAR | Status: AC
Start: 2017-03-04 — End: ?
  Filled 2017-03-04: qty 1

## 2017-03-04 MED ORDER — PROPOFOL 10 MG/ML IV BOLUS
INTRAVENOUS | Status: AC
  Filled 2017-03-04: qty 20

## 2017-03-04 MED ORDER — BUPIVACAINE-EPINEPHRINE 0.5% -1:200000 IJ SOLN
INTRAMUSCULAR | Status: DC | PRN
  Administered 2017-03-04: 20 mL

## 2017-03-04 MED ORDER — FENTANYL CITRATE (PF) 100 MCG/2ML IJ SOLN: 25.0000 ug | INTRAMUSCULAR | Status: DC | PRN

## 2017-03-04 MED ORDER — ONDANSETRON HCL 4 MG/2ML IJ SOLN: 4.0000 mg | Freq: Once | INTRAMUSCULAR | Status: DC | PRN

## 2017-03-04 MED ORDER — DEXAMETHASONE SODIUM PHOSPHATE 10 MG/ML IJ SOLN
INTRAMUSCULAR | Status: DC | PRN
  Administered 2017-03-04: 10 mg via INTRAVENOUS

## 2017-03-04 MED ORDER — FENTANYL CITRATE (PF) 100 MCG/2ML IJ SOLN
INTRAMUSCULAR | Status: AC
  Filled 2017-03-04: qty 2

## 2017-03-04 MED ORDER — HYDROCODONE-ACETAMINOPHEN 5-325 MG PO TABS
ORAL_TABLET | ORAL | Status: AC
  Filled 2017-03-04: qty 1

## 2017-03-04 MED ORDER — ACETAMINOPHEN 10 MG/ML IV SOLN
INTRAVENOUS | Status: DC | PRN
Start: 2017-03-04 — End: 2017-03-04
  Administered 2017-03-04: 1000 mg via INTRAVENOUS

## 2017-03-04 MED ORDER — MIDAZOLAM HCL 2 MG/2ML IJ SOLN
INTRAMUSCULAR | Status: DC | PRN
  Administered 2017-03-04: 2 mg via INTRAVENOUS

## 2017-03-04 MED ORDER — HYDROCODONE-ACETAMINOPHEN 5-325 MG PO TABS
1.0000 | ORAL_TABLET | Freq: Once | ORAL | Status: AC
  Administered 2017-03-04: 1 via ORAL

## 2017-03-04 MED ORDER — FAMOTIDINE 20 MG PO TABS
20.0000 mg | ORAL_TABLET | Freq: Once | ORAL | Status: AC
  Administered 2017-03-04: 20 mg via ORAL

## 2017-03-04 MED ORDER — PHENYLEPHRINE HCL 10 MG/ML IJ SOLN
INTRAMUSCULAR | Status: DC | PRN
  Administered 2017-03-04: 200 ug via INTRAVENOUS
  Administered 2017-03-04 (×2): 100 ug via INTRAVENOUS
  Administered 2017-03-04 (×2): 200 ug via INTRAVENOUS

## 2017-03-04 SURGICAL SUPPLY — 41 items
BANDAGE ELASTIC 6 LF NS (GAUZE/BANDAGES/DRESSINGS) ×1 IMPLANT
BLADE SURG 15 STRL SS SAFETY (BLADE) ×4 IMPLANT
BNDG CMPR MED 5X6 ELC HKLP NS (GAUZE/BANDAGES/DRESSINGS)
BNDG GAUZE 4.5X4.1 6PLY STRL (MISCELLANEOUS) ×2 IMPLANT
CANISTER SUCT 1200ML W/VALVE (MISCELLANEOUS) ×2 IMPLANT
CHLORAPREP W/TINT 26ML (MISCELLANEOUS) ×3 IMPLANT
CNTNR SPEC 2.5X3XGRAD LEK (MISCELLANEOUS) ×1
CONT SPEC 4OZ STER OR WHT (MISCELLANEOUS) ×1
CONT SPEC 4OZ STRL OR WHT (MISCELLANEOUS) ×1
CONTAINER SPEC 2.5X3XGRAD LEK (MISCELLANEOUS) ×1 IMPLANT
COVER PROBE FLX POLY STRL (MISCELLANEOUS) ×2 IMPLANT
DEVICE DUBIN SPECIMEN MAMMOGRA (MISCELLANEOUS) ×2 IMPLANT
DRAPE CHEST BREAST 77X106 FENE (MISCELLANEOUS) ×2 IMPLANT
DRAPE LAPAROTOMY 100X77 ABD (DRAPES) ×2 IMPLANT
DRSG TELFA 4X3 1S NADH ST (GAUZE/BANDAGES/DRESSINGS) ×3 IMPLANT
ELECT CAUTERY BLADE TIP 2.5 (TIP) ×2
ELECT REM PT RETURN 9FT ADLT (ELECTROSURGICAL) ×2
ELECTRODE CAUTERY BLDE TIP 2.5 (TIP) ×1 IMPLANT
ELECTRODE REM PT RTRN 9FT ADLT (ELECTROSURGICAL) ×1 IMPLANT
GAUZE FLUFF 18X24 1PLY STRL (GAUZE/BANDAGES/DRESSINGS) ×1 IMPLANT
GLOVE BIO SURGEON STRL SZ7.5 (GLOVE) ×2 IMPLANT
GLOVE INDICATOR 8.0 STRL GRN (GLOVE) ×2 IMPLANT
GOWN STRL REUS W/ TWL LRG LVL3 (GOWN DISPOSABLE) ×2 IMPLANT
GOWN STRL REUS W/TWL LRG LVL3 (GOWN DISPOSABLE) ×4
KIT RM TURNOVER STRD PROC AR (KITS) ×2 IMPLANT
LABEL OR SOLS (LABEL) ×1 IMPLANT
MARGIN MAP 10MM (MISCELLANEOUS) ×2 IMPLANT
NDL HYPO 25X1 1.5 SAFETY (NEEDLE) ×1 IMPLANT
NDL SAFETY 22GX1.5 (NEEDLE) ×2 IMPLANT
NEEDLE HYPO 25X1 1.5 SAFETY (NEEDLE) ×2 IMPLANT
PACK BASIN MINOR ARMC (MISCELLANEOUS) ×2 IMPLANT
STRIP CLOSURE SKIN 1/2X4 (GAUZE/BANDAGES/DRESSINGS) ×3 IMPLANT
SUT ETHILON 3-0 FS-10 30 BLK (SUTURE) ×2
SUT VIC AB 2-0 CT1 27 (SUTURE) ×2
SUT VIC AB 2-0 CT1 TAPERPNT 27 (SUTURE) ×1 IMPLANT
SUT VIC AB 4-0 FS2 27 (SUTURE) ×2 IMPLANT
SUTURE EHLN 3-0 FS-10 30 BLK (SUTURE) ×1 IMPLANT
SWABSTK COMLB BENZOIN TINCTURE (MISCELLANEOUS) ×4 IMPLANT
SYR CONTROL 10ML (SYRINGE) ×2 IMPLANT
TAPE TRANSPORE STRL 2 31045 (GAUZE/BANDAGES/DRESSINGS) ×1 IMPLANT
WATER STERILE IRR 1000ML POUR (IV SOLUTION) ×2 IMPLANT

## 2017-03-04 NOTE — OR Nursing (Signed)
Pharmacy consulted NW:[id number]:allergy alert. Pharmacist discussed allergy with pt ok to proceed.

## 2017-03-04 NOTE — Anesthesia Postprocedure Evaluation (Signed)
Anesthesia Post Note  Patient: Ariana Weeks  Procedure(s) Performed: Procedure(s) (LRB): BREAST BIOPSY WITH NEEDLE LOCALIZATION (Right)  Patient location during evaluation: PACU Anesthesia Type: General Level of consciousness: awake Pain management: pain level controlled Vital Signs Assessment: post-procedure vital signs reviewed and stable Respiratory status: nonlabored ventilation Cardiovascular status: stable Anesthetic complications: no     Last Vitals:  Vitals:   03/04/17 1347 03/04/17 1402  BP: 128/82 107/73  Pulse: 92 84  Resp: (!) 25 (!) 40  Temp: 36.4 C     Last Pain:  Vitals:   03/04/17 1347  TempSrc:   PainSc: Asleep                 VAN STAVEREN,Tarquin Welcher

## 2017-03-04 NOTE — H&P (Signed)
No change in clinical condition or exam.   For excision of ADH right breast.

## 2017-03-04 NOTE — Anesthesia Procedure Notes (Signed)
Procedure Name: LMA Insertion Date/Time: 03/04/2017 12:31 PM Performed by: Michaele OfferSAVAGE, Freada Twersky Pre-anesthesia Checklist: Patient identified, Emergency Drugs available, Suction available, Patient being monitored and Timeout performed Patient Re-evaluated:Patient Re-evaluated prior to induction Oxygen Delivery Method: Circle system utilized Preoxygenation: Pre-oxygenation with 100% oxygen Induction Type: IV induction Ventilation: Mask ventilation without difficulty LMA: LMA inserted LMA Size: 4.0 Number of attempts: 1 Placement Confirmation: positive ETCO2 and breath sounds checked- equal and bilateral Tube secured with: Tape Dental Injury: Teeth and Oropharynx as per pre-operative assessment

## 2017-03-04 NOTE — Anesthesia Preprocedure Evaluation (Signed)
Anesthesia Evaluation  Patient identified by MRN, date of birth, ID band Patient awake    Reviewed: Allergy & Precautions, NPO status , Patient's Chart, lab work & pertinent test results  Airway Mallampati: II       Dental  (+) Teeth Intact   Pulmonary COPD, former smoker,     + decreased breath sounds      Cardiovascular Exercise Tolerance: Good  Rhythm:Regular     Neuro/Psych  Headaches, Depression    GI/Hepatic negative GI ROS, Neg liver ROS,   Endo/Other  Morbid obesity  Renal/GU      Musculoskeletal   Abdominal (+) + obese,   Peds negative pediatric ROS (+)  Hematology negative hematology ROS (+)   Anesthesia Other Findings   Reproductive/Obstetrics                             Anesthesia Physical Anesthesia Plan  ASA: III  Anesthesia Plan: General   Post-op Pain Management:    Induction: Intravenous  PONV Risk Score and Plan: 0  Airway Management Planned: LMA  Additional Equipment:   Intra-op Plan:   Post-operative Plan: Extubation in OR  Informed Consent: I have reviewed the patients History and Physical, chart, labs and discussed the procedure including the risks, benefits and alternatives for the proposed anesthesia with the patient or authorized representative who has indicated his/her understanding and acceptance.     Plan Discussed with: CRNA  Anesthesia Plan Comments:         Anesthesia Quick Evaluation

## 2017-03-04 NOTE — Op Note (Signed)
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  Postoperative diagnosis: Same.  Operative procedure, wire localization and excision of right breast lesion.  Operating surgeon: Lane HackerJeffery Byrnett, M.D.  Anesthesia: Gen. by LMA, Marcaine 0.5% with 1-200,000 units of epinephrine, 20 mL.  History blood loss: 5 mL.  Clinical note: This 46 year old woman had an abnormal mammogram and core biopsy showed foci of ALH. She was admitted for local excision. Wire localization was completed prior to procedure.  Upper note: With the patient under adequate general anesthesia the breast was prepped with ChloraPrep and draped. This was a very posterior lesion and the wire was approximately 8 cm from the skin to the site of the previously placed biopsy clip. Ultrasound was used to identify the tip of the wire and this area was marked. Local anesthetic was infiltrated. The curvilinear incision in the upper-outer quadrant of the right breast was made and carried down through skin and subcutaneous tissue. Hemostasis was electrocautery. The wire was identified in the lateral aspect of the breast and the block of tissue 3 x 3 x 5 cm was excised orientated and sent for specimen radiograph. The previously placed biopsy clip was towards the lateral border of the specimen. The specimen was then sent for routine histology.  Good hemostasis was obtained. The deep tissue was approximated with interrupted 2-0 Vicryl figure-of-eight sutures. This was done in multiple layers. The skin was closed with running 4-0 Vicryl septic suture. Benzoin, Steri-Strips, Telfa and Tegaderm dressing applied.  The patient tolerated the procedure well and was taken recovery room in stable condition.

## 2017-03-04 NOTE — Transfer of Care (Signed)
Immediate Anesthesia Transfer of Care Note  Patient: Ariana Weeks  Procedure(s) Performed: Procedure(s): BREAST BIOPSY WITH NEEDLE LOCALIZATION (Right)  Patient Location: PACU  Anesthesia Type:General  Level of Consciousness: awake and patient cooperative  Airway & Oxygen Therapy: Patient Spontanous Breathing and Patient connected to face mask oxygen  Post-op Assessment: Report given to RN, Post -op Vital signs reviewed and stable and Patient moving all extremities X 4  Post vital signs: Reviewed and stable  Last Vitals:  Vitals:   03/04/17 0941 03/04/17 1347  BP: (!) 143/97 128/82  Pulse: 90 92  Resp: 18 (!) 25  Temp: 36.8 C 36.4 C    Last Pain:  Vitals:   03/04/17 0941  TempSrc: Oral  PainSc: 0-No pain         Complications: No apparent anesthesia complications

## 2017-03-04 NOTE — Anesthesia Post-op Follow-up Note (Cosign Needed)
Anesthesia QCDR form completed.        

## 2017-03-05 ENCOUNTER — Ambulatory Visit: Payer: Self-pay

## 2017-03-05 ENCOUNTER — Encounter: Payer: Self-pay | Admitting: General Surgery

## 2017-03-05 LAB — SURGICAL PATHOLOGY

## 2017-03-06 ENCOUNTER — Telehealth: Payer: Self-pay | Admitting: General Surgery

## 2017-03-06 NOTE — Telephone Encounter (Signed)
Message left that pathology was fine.  Will discuss at follow up or she can call back for details.

## 2017-03-09 ENCOUNTER — Other Ambulatory Visit: Payer: Self-pay

## 2017-03-09 ENCOUNTER — Telehealth: Payer: Self-pay | Admitting: *Deleted

## 2017-03-09 NOTE — Telephone Encounter (Signed)
LMOM for patient to return call.

## 2017-03-09 NOTE — Telephone Encounter (Signed)
-----   Message from Vanna ScotlandAshley Brandon, MD sent at 03/09/2017  1:36 PM EDT ----- This patient no showed to her follow up scheduled 7/12.  She needs to follow up to discuss finding.  She has residual swelling of her kidney and needs to be seen.  Vanna ScotlandAshley Brandon, MD

## 2017-03-11 ENCOUNTER — Ambulatory Visit: Payer: BC Managed Care – PPO

## 2017-03-11 NOTE — Telephone Encounter (Signed)
LMOM for patient to call office back. 

## 2017-03-13 NOTE — Telephone Encounter (Signed)
LMOM for patient to return our call.

## 2017-03-15 ENCOUNTER — Emergency Department
Admission: EM | Admit: 2017-03-15 | Discharge: 2017-03-15 | Disposition: A | Discharge status: Home / Self Care | Payer: BC Managed Care – PPO | Attending: Emergency Medicine | Admitting: Emergency Medicine

## 2017-03-15 DIAGNOSIS — L7682 Other postprocedural complications of skin and subcutaneous tissue: Secondary | ICD-10-CM | POA: Diagnosis not present

## 2017-03-15 DIAGNOSIS — N644 Mastodynia: Secondary | ICD-10-CM | POA: Diagnosis present

## 2017-03-15 DIAGNOSIS — Z79899 Other long term (current) drug therapy: Secondary | ICD-10-CM | POA: Insufficient documentation

## 2017-03-15 DIAGNOSIS — N6081 Other benign mammary dysplasias of right breast: Secondary | ICD-10-CM | POA: Insufficient documentation

## 2017-03-15 DIAGNOSIS — F17210 Nicotine dependence, cigarettes, uncomplicated: Secondary | ICD-10-CM | POA: Insufficient documentation

## 2017-03-15 DIAGNOSIS — N6489 Other specified disorders of breast: Secondary | ICD-10-CM

## 2017-03-15 LAB — CBC WITH DIFFERENTIAL/PLATELET
Basophils Absolute: 0.1 10*3/uL (ref 0–0.1)
Basophils Relative: 1 %
EOS PCT: 1 %
Eosinophils Absolute: 0.1 10*3/uL (ref 0–0.7)
HEMATOCRIT: 40.1 % (ref 35.0–47.0)
Hemoglobin: 14.1 g/dL (ref 12.0–16.0)
LYMPHS ABS: 2.5 10*3/uL (ref 1.0–3.6)
LYMPHS PCT: 25 %
MCH: 31.3 pg (ref 26.0–34.0)
MCHC: 35.1 g/dL (ref 32.0–36.0)
MCV: 89.3 fL (ref 80.0–100.0)
MONO ABS: 0.6 10*3/uL (ref 0.2–0.9)
Monocytes Relative: 6 %
Neutro Abs: 6.8 10*3/uL — ABNORMAL HIGH (ref 1.4–6.5)
Neutrophils Relative %: 67 %
PLATELETS: 231 10*3/uL (ref 150–440)
RBC: 4.49 MIL/uL (ref 3.80–5.20)
RDW: 13.7 % (ref 11.5–14.5)
WBC: 10.1 10*3/uL (ref 3.6–11.0)

## 2017-03-15 LAB — BASIC METABOLIC PANEL
ANION GAP: 8 (ref 5–15)
BUN: 12 mg/dL (ref 6–20)
CO2: 25 mmol/L (ref 22–32)
Calcium: 9.3 mg/dL (ref 8.9–10.3)
Chloride: 105 mmol/L (ref 101–111)
Creatinine, Ser: 0.81 mg/dL (ref 0.44–1.00)
GFR calc Af Amer: >60 mL/min (ref >60)
GLUCOSE: 142 mg/dL — AB (ref 65–99)
POTASSIUM: 3.5 mmol/L (ref 3.5–5.1)
Sodium: 138 mmol/L (ref 135–145)

## 2017-03-15 MED ORDER — OXYCODONE-ACETAMINOPHEN 5-325 MG PO TABS
ORAL_TABLET | ORAL | Status: AC
  Filled 2017-03-15: qty 1

## 2017-03-15 MED ORDER — OXYCODONE-ACETAMINOPHEN 5-325 MG PO TABS
1.0000 | ORAL_TABLET | ORAL | Status: DC | PRN
  Administered 2017-03-15: 1 via ORAL

## 2017-03-15 NOTE — ED Triage Notes (Signed)
Pt had lumpectomy 2 weeks ago right breast - tonight she coughed and felt a severe ache and then noted a knot over/around the incision site - she is having sever sharp pain around her right breast now

## 2017-03-15 NOTE — ED Notes (Signed)
Spoke to Dr Scotty CourtStafford about pt and obtained orders for CBC w/diff, BMP, and to start IV

## 2017-03-15 NOTE — ED Notes (Signed)
Pt and spouse very angry no pain medication has been administered. Informed pt and spouse this rn will attempt to secure pain medication order. Pt states "this is unbearable".

## 2017-03-15 NOTE — ED Notes (Signed)
Pt states can take oxycodone.

## 2017-03-15 NOTE — ED Provider Notes (Signed)
[facility] Emergency Department Provider Note  ____________________________________________   First MD Initiated Contact with Patient 03/15/17 2047     (approximate)  I have reviewed the triage vital signs and the nursing notes.   HISTORY  Chief Complaint Breast Pain   HPI Ariana Weeks is a 46 y.o. female with a right-sided breast biopsy 2 weeks ago who is presenting to the emergency department with sudden onset pain after coughing in the right breast. She said that she coughed forcefully and then started have pain in the right breast and swelling. She says that this happened at dinnertime at about 5:30 PM. She says that she is also noted a small amount of blood in the Steri-Strips at the incision site as well as ecchymosis on the right lateral breast. She says that the skin feels tight and is painful to touch.   Past Medical History:  Diagnosis Date  . Depression   . Frequent headaches   . Head cold   . Headache(784.0)   . History of frequent urinary tract infections   . History of kidney stones   . Kidney stones   . Left ureteral calculus     Patient Active Problem List   Diagnosis Date Noted  . 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 Hydronephrosis of left kidney 02/02/2017  . Anxiety and depression 07/17/2015    Past Surgical History:  Procedure Laterality Date  . BREAST BIOPSY Right 02/02/2013   [place] bx/clip-neg  . BREAST BIOPSY Right 02/19/2017   affirm, ATYPICAL LOBULAR HYPERPLASIA  . BREAST BIOPSY Right 03/04/2017   Procedure: BREAST BIOPSY WITH NEEDLE LOCALIZATION;  Surgeon: Earline Mayotte, MD;  Location: [facility] ORS;  Service: General;  Laterality: Right;  . BREAST LUMPECTOMY    . CESAREAN SECTION  2005  . CHOLECYSTECTOMY  09/2012  . CYSTOSCOPY W/ RETROGRADES N/A 02/02/2017   Procedure: CYSTOSCOPY WITH RETROGRADE PYELOGRAM;  Surgeon: Bjorn Pippin, MD;  Location: [facility] ORS;  Service: Urology;  Laterality:  N/A;  . EXTRACORPOREAL SHOCK WAVE LITHOTRIPSY  2009  . INTRAUTERINE DEVICE INSERTION  MARCH 2012   MURINA  . URETERAL STENT PLACEMENT  1998  (APPROX)   Kidney Stone  . URETEROSCOPY WITH HOLMIUM LASER LITHOTRIPSY Left 02/02/2017   Procedure: URETEROSCOPY WITH HOLMIUM LASER LITHOTRIPSY;  Surgeon: Bjorn Pippin, MD;  Location: [facility] ORS;  Service: Urology;  Laterality: Left;    Prior to Admission medications   Medication Sig Start Date End Date Taking? Authorizing Provider  acetaminophen (TYLENOL) 325 MG tablet Take 650 mg by mouth every 6 (six) hours as needed for headache.    [provider]  buPROPion (WELLBUTRIN XL) 300 MG 24 hr tablet Take 1 tablet (300 mg total) by mouth daily. 01/13/17   Tommie Sams, DO  clonazePAM (KLONOPIN) 0.5 MG tablet Take 1 tablet (0.5 mg total) by mouth 2 (two) times daily as needed for anxiety. 01/13/17   Tommie Sams, DO  HYDROcodone-acetaminophen (NORCO) 5-325 MG tablet Take 1-2 tablets by mouth every 4 (four) hours as needed for moderate pain. 03/04/17   Earline Mayotte, MD  ibuprofen (ADVIL,MOTRIN) 200 MG tablet Take 600-800 mg by mouth every 6 (six) hours as needed for headache.    [provider]  ketorolac (TORADOL) 10 MG tablet Take 1 tablet (10 mg total) by mouth every 8 (eight) hours as needed for severe pain. Patient not taking: Reported on 02/27/2017 02/03/17   Enedina Finner, MD  oxybutynin (DITROPAN) 5 MG  tablet Take 1 tablet (5 mg total) by mouth 3 (three) times daily as needed for bladder spasms. Patient not taking: Reported on 02/27/2017 02/03/17   Enedina Finner, MD  tamsulosin (FLOMAX) 0.4 MG CAPS capsule Take 1 capsule (0.4 mg total) by mouth daily. 02/03/17   Enedina Finner, MD    Allergies Dilaudid [hydromorphone hcl]; Nitrofurantoin; and Morphine and related  Family History  Problem Relation Age of Onset  . Cancer Father        Lung  . Cancer Mother 35       ? breast age late 28's  . Alcohol abuse Maternal Uncle   . Alcohol abuse  Paternal Uncle   . Cancer Maternal Grandmother        Ovarian  . Cancer Maternal Grandfather        Lymphoma  . Cancer Maternal Aunt 51       great aunt breast late 9's  . Alcohol abuse Paternal Grandfather   . Breast cancer Neg Hx     Social History Social History  Substance Use Topics  . Smoking status: Current Every Day Smoker    Packs/day: 0.50    Years: 16.00    Types: Cigarettes    Last attempt to quit: 06/25/2014  . Smokeless tobacco: Never Used  . Alcohol use 0.0 oz/week     Comment: RARE    Review of Systems  Constitutional: No fever/chills Eyes: No visual changes. ENT: No sore throat. Cardiovascular: Denies chest pain. Respiratory: Denies shortness of breath. Gastrointestinal: No abdominal pain.  No nausea, no vomiting.  No diarrhea.  No constipation. Genitourinary: Negative for dysuria. Musculoskeletal: Negative for back pain. Skin: Negative for rash. Neurological: Negative for headaches, focal weakness or numbness.   ____________________________________________   PHYSICAL EXAM:  VITAL SIGNS: ED Triage Vitals  Enc Vitals Group     BP 03/15/17 1819 (!) 156/99     Pulse Rate 03/15/17 1819 (!) 121     Resp 03/15/17 1819 (!) 22     Temp 03/15/17 1819 98.5 F (36.9 C)     Temp Source 03/15/17 1819 Oral     SpO2 03/15/17 1818 99 %     Weight 03/15/17 1819 200 lb (90.7 kg)     Height 03/15/17 1819 5\' 2"  (1.575 m)     Head Circumference --      Peak Flow --      Pain Score 03/15/17 1818 10     Pain Loc --      Pain Edu? --      Excl. in GC? --     Constitutional: Alert and oriented. Well appearing and in no acute distress. Eyes: Conjunctivae are normal.  Head: Atraumatic. Nose: No congestion/rhinnorhea. Mouth/Throat: Mucous membranes are moist.  Neck: No stridor.   Cardiovascular: Normal rate, regular rhythm. Grossly normal heart sounds.   Respiratory: Normal respiratory effort.  No retractions. Lungs CTAB. Gastrointestinal: Soft and  nontender. No distention. Musculoskeletal: No lower extremity tenderness nor edema.  No joint effusions. Neurologic:  Normal speech and language. No gross focal neurologic deficits are appreciated. Skin:  Right breast laterally with ecchymosis with skin that is compressible but with a fullness and tenderness to palpation. Ecchymosis bands about 5 cm x 6 cm and is oval shaped.  There is no active bleeding and no dehiscence at the incision site which is to the right upper outer quadrant of the breast. The ecchymotic area is closer to the axilla.    Psychiatric: Mood and affect are  normal. Speech and behavior are normal.  ____________________________________________   LABS (all labs ordered are listed, but only abnormal results are displayed)  Labs Reviewed  CBC WITH DIFFERENTIAL/PLATELET - Abnormal; Notable for the following:       Result Value   Neutro Abs 6.8 (*)    All other components within normal limits  BASIC METABOLIC PANEL - Abnormal; Notable for the following:    Glucose, Bld 142 (*)    All other components within normal limits   ____________________________________________  EKG   ____________________________________________  RADIOLOGY   ____________________________________________   PROCEDURES  Procedure(s) performed:   Procedures  Critical Care performed:   ____________________________________________   INITIAL IMPRESSION / ASSESSMENT AND PLAN / ED COURSE  Pertinent labs & imaging results that were available during my care of the patient were reviewed by me and considered in my medical decision making (see chart for details).  Exam and history consistent with hematoma to the right breast likely from a ruptured vein or slight subcutaneous dehiscence. Patient without any distress at this time. Wrapped with an ace wrap STEMI loosely around the chest and the patient is able to breathe without any difficulty. This is for compression purposes. The patient was  also directed to use ice to the area. Very reassuring lab work. Has an appointment for follow-up with her surgeon, Dr. Doristine CounterBurnett this Tuesday. Patient understands the plan for discharge and wanted to comply. Patient says that swelling is only minimally increased from when she arrived to the emergency department several hours ago. Unlikely to be any sort of brisk bleed or showing any signs of compartment syndrome at this time.      ____________________________________________   FINAL CLINICAL IMPRESSION(S) / ED DIAGNOSES  Hematoma to the right breast.    NEW MEDICATIONS STARTED DURING THIS VISIT:  New Prescriptions   No medications on file     Note:  This document was prepared using Dragon voice recognition software and may include unintentional dictation errors.     Myrna BlazerSchaevitz, Zitlali Primm Matthew, MD 03/15/17 2228

## 2017-03-15 NOTE — ED Notes (Signed)
Nurse placed ace wrap on chest for compression per MD request

## 2017-03-17 ENCOUNTER — Ambulatory Visit (INDEPENDENT_AMBULATORY_CARE_PROVIDER_SITE_OTHER): Payer: BC Managed Care – PPO | Admitting: General Surgery

## 2017-03-17 ENCOUNTER — Encounter: Payer: Self-pay | Admitting: General Surgery

## 2017-03-17 VITALS — BP 130/80 | HR 96 | Resp 14 | Ht 63.0 in | Wt 206.0 lb

## 2017-03-17 DIAGNOSIS — D0500 Lobular carcinoma in situ of unspecified breast: Secondary | ICD-10-CM | POA: Insufficient documentation

## 2017-03-17 DIAGNOSIS — D0501 Lobular carcinoma in situ of right breast: Secondary | ICD-10-CM

## 2017-03-17 NOTE — Patient Instructions (Addendum)
Return in two weeks. The patient is aware to call back for any questions or concerns.  

## 2017-03-17 NOTE — Telephone Encounter (Signed)
No answer. Letter sent.

## 2017-03-17 NOTE — Progress Notes (Signed)
Patient ID: Ariana Weeks, female   DOB: 12-25-1970, 46 y.o.   MRN: [id number]  Chief Complaint  Patient presents with  . Routine Post Op    HPI Ariana Weeks is a 46 y.o. female.  Here for post op right breast lumpectomy. She states she was doing well until Sunday. She states she coughed and had a sharp pain in the right breast with swelling and visual bruising. She attempted to call the office with no answer she then went to the ED. Ace wrap was placed.   HPI  Past Medical History:  Diagnosis Date  . Depression   . Frequent headaches   . Head cold   . Headache(784.0)   . History of frequent urinary tract infections   . History of kidney stones   . Kidney stones   . Left ureteral calculus     Past Surgical History:  Procedure Laterality Date  . BREAST BIOPSY Right 02/02/2013   [place] bx/clip-neg  . BREAST BIOPSY Right 02/19/2017   affirm, ATYPICAL LOBULAR HYPERPLASIA  . BREAST BIOPSY Right 03/04/2017   Procedure: BREAST BIOPSY WITH NEEDLE LOCALIZATION;  Surgeon: Earline MayotteByrnett, Bev Drennen W, MD;  Location: [facility] ORS;  Service: General;  Laterality: Right;  . BREAST LUMPECTOMY    . CESAREAN SECTION  2005  . CHOLECYSTECTOMY  09/2012  . CYSTOSCOPY W/ RETROGRADES N/A 02/02/2017   Procedure: CYSTOSCOPY WITH RETROGRADE PYELOGRAM;  Surgeon: Bjorn PippinWrenn, John, MD;  Location: [facility] ORS;  Service: Urology;  Laterality: N/A;  . EXTRACORPOREAL SHOCK WAVE LITHOTRIPSY  2009  . INTRAUTERINE DEVICE INSERTION  MARCH 2012   MURINA  . URETERAL STENT PLACEMENT  1998  (APPROX)   Kidney Stone  . URETEROSCOPY WITH HOLMIUM LASER LITHOTRIPSY Left 02/02/2017   Procedure: URETEROSCOPY WITH HOLMIUM LASER LITHOTRIPSY;  Surgeon: Bjorn PippinWrenn, John, MD;  Location: [facility] ORS;  Service: Urology;  Laterality: Left;    Family History  Problem Relation Age of Onset  . Cancer Father        Lung  . Cancer Mother 2540       ? breast age late 940's  . Alcohol abuse Maternal Uncle   . Alcohol abuse Paternal Uncle   . Cancer Maternal  Grandmother        Ovarian  . Cancer Maternal Grandfather        Lymphoma  . Cancer Maternal Aunt 6770       great aunt breast late 9370's  . Alcohol abuse Paternal Grandfather   . Breast cancer Neg Hx     Social History Social History  Substance Use Topics  . Smoking status: Current Every Day Smoker    Packs/day: 0.50    Years: 16.00    Types: Cigarettes    Last attempt to quit: 06/25/2014  . Smokeless tobacco: Never Used  . Alcohol use 0.0 oz/week     Comment: RARE    Allergies  Allergen Reactions  . Dilaudid [Hydromorphone Hcl] Itching    Red face and itching  . Nitrofurantoin Hives    Macrobid  . Morphine And Related Hives and Rash    Current Outpatient Prescriptions  Medication Sig Dispense Refill  . acetaminophen (TYLENOL) 325 MG tablet Take 650 mg by mouth every 6 (six) hours as needed for headache.    Marland Kitchen. buPROPion (WELLBUTRIN XL) 300 MG 24 hr tablet Take 1 tablet (300 mg total) by mouth daily. 90 tablet 3  . clonazePAM (KLONOPIN) 0.5 MG tablet Take 1 tablet (0.5 mg total) by mouth 2 (two) times  daily as needed for anxiety. 60 tablet 1  . HYDROcodone-acetaminophen (NORCO) 5-325 MG tablet Take 1-2 tablets by mouth every 4 (four) hours as needed for moderate pain. 20 tablet 0  . ibuprofen (ADVIL,MOTRIN) 200 MG tablet Take 600-800 mg by mouth every 6 (six) hours as needed for headache.    . ketorolac (TORADOL) 10 MG tablet Take 1 tablet (10 mg total) by mouth every 8 (eight) hours as needed for severe pain. 20 tablet 0  . oxybutynin (DITROPAN) 5 MG tablet Take 1 tablet (5 mg total) by mouth 3 (three) times daily as needed for bladder spasms. 25 tablet 0  . tamsulosin (FLOMAX) 0.4 MG CAPS capsule Take 1 capsule (0.4 mg total) by mouth daily. 30 capsule 0   No current facility-administered medications for this visit.     Review of Systems Review of Systems  Constitutional: Negative.   Respiratory: Negative.   Cardiovascular: Negative.     Blood pressure 130/80, pulse  96, resp. rate 14, height 5\' 3"  (1.6 m), weight 206 lb (93.4 kg).  Physical Exam Physical Exam  Constitutional: She is oriented to person, place, and time. She appears well-developed and well-nourished.  Pulmonary/Chest:    Neurological: She is alert and oriented to person, place, and time.  Skin: Skin is warm and dry.    Data Reviewed DIAGNOSIS:  A. 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  - FOCAL FLAT EPITHELIAL ATYPIA.  - FIBROADENOMATOUS CHANGE.  - COLUMNAR CELL CHANGE.  - PSEUDO-ANGIOMATOUS STROMAL HYPERPLASIA.  - FIBROCYSTIC CHANGE.  - BIOPSY SITE CHANGES, MARKER CLIP PRESENT.   Note: ALH/LCIS is present in 4 of 13 tissue blocks.   ED records from 03/15/2017.  This case was presented at the [facility] breast tumor conference on 03/15/2017. Chemoprevention recommended.  Assessment    LCIS.  Possible hematoma/acute bleed after violent coughing. Resolving.    Plan    Local heat for resolution of residual soreness.  Continue to wear a bra for comfort until arrest heaviness has resolved.  Reviewed the fact that LCIS is not treated as cancer but is more marker for future events.  Briefly reviewed role of chemotherapy prevention, anticipating a 50% risk reduction.. She is well-established on Wellbutrin for her depression and this precludes the use of tamoxifen. Evista does not interact with Wellbutrin and would be a likely good substitute.  Primary risks related to Evista therapy would be DVT and vasomotor symptoms. The latter may be less pronounced with her ongoing antidepressant therapy.  Patient presently has a Mirena IUD. No indication for early removal.   Patient to return in two weeks. . The patient is aware to call back for any questions or concerns.   HPI, Physical Exam, Assessment and Plan have been scribed under the direction and in the presence of Donnalee Curry, MD.  Ples Specter, CMA  I have completed the exam and reviewed the above documentation for accuracy and completeness.  I agree with the above.  Museum/gallery conservator has been used and any errors in dictation or transcription are unintentional.  Donnalee Curry, M.D., F.A.C.S.  Earline Mayotte 03/17/2017, 10:02 PM

## 2017-03-28 ENCOUNTER — Other Ambulatory Visit: Payer: Self-pay | Admitting: General Surgery

## 2017-03-28 MED ORDER — SULFAMETHOXAZOLE-TRIMETHOPRIM 800-160 MG PO TABS: 1.0000 | ORAL_TABLET | Freq: Two times a day (BID) | ORAL | 0 refills | Status: DC

## 2017-03-28 NOTE — Progress Notes (Signed)
The patient called reporting a steady stream of dark blood from her incision in the upper-outer quadrant of the right breast.  She was accompanied today by her mother, Sanjuana KavaBetsy Mooney.  Examination showed a trickle of blood through pinpoint opening in the lateral aspect of the incision suggesting spontaneous drainage of a hematoma.  The skin was cleansed with Betadine and a Q-tip used to open the incision. About 5-10 mL of old blood was expressed. Probing showed no loculations. The skin was closed with benzoin and Steri-Strips followed by dry dressing.  There is no erythema or induration, but with the blood pocket potentially remaining the patient will be placed on Bactrim DS one by mouth twice a day until her scheduled follow-up on 03/31/2017.  The patient has been asked to make use of a washcloth in the cup of her brought to apply a little bit of pressure to this area, and if she has additional drainage to use a sanitary pad for hygiene.

## 2017-03-31 ENCOUNTER — Encounter: Payer: Self-pay | Admitting: General Surgery

## 2017-03-31 ENCOUNTER — Ambulatory Visit (INDEPENDENT_AMBULATORY_CARE_PROVIDER_SITE_OTHER): Payer: BC Managed Care – PPO | Admitting: General Surgery

## 2017-03-31 VITALS — BP 122/72 | HR 98 | Resp 14 | Ht 63.0 in | Wt 204.0 lb

## 2017-03-31 DIAGNOSIS — D0501 Lobular carcinoma in situ of right breast: Secondary | ICD-10-CM

## 2017-03-31 MED ORDER — RALOXIFENE HCL 60 MG PO TABS: 60.0000 mg | ORAL_TABLET | Freq: Every day | ORAL | 11 refills | Status: DC

## 2017-03-31 NOTE — Patient Instructions (Signed)
Patient to return  

## 2017-03-31 NOTE — Progress Notes (Signed)
Patient ID: Ariana Weeks, female   DOB: 1970-12-20, 46 y.o.   MRN: [id number]  Chief Complaint  Patient presents with  . Routine Post Op    rigth breast wide excision    HPI Ariana Weeks is a 46 y.o. female here today for her post op right breast wide excision done on 03/04/2017. Patient states she is doing well. She did have to come over to the office over the weekend because of bleeding. She states the bandage is still intact.  she is here with her mother, Ariana Weeks.  HPI  Past Medical History:  Diagnosis Date  . Depression   . Frequent headaches   . Head cold   . Headache(784.0)   . History of frequent urinary tract infections   . History of kidney stones   . Kidney stones   . Left ureteral calculus     Past Surgical History:  Procedure Laterality Date  . BREAST BIOPSY Right 02/02/2013   us bx/clip-neg  . BREAST BIOPSY Right 02/19/2017   affirm, ATYPICAL LOBULAR HYPERPLASIA  . BREAST BIOPSY Right 03/04/2017   Procedure: BREAST BIOPSY WITH NEEDLE LOCALIZATION;  Surgeon: Earline MayotteByrnett, Mahkayla Preece W, MD;  Location: [facility] ORS;  Service: General;  Laterality: Right;  . BREAST LUMPECTOMY    . CESAREAN SECTION  2005  . CHOLECYSTECTOMY  09/2012  . CYSTOSCOPY W/ RETROGRADES N/A 02/02/2017   Procedure: CYSTOSCOPY WITH RETROGRADE PYELOGRAM;  Surgeon: Bjorn PippinWrenn, John, MD;  Location: [facility] ORS;  Service: Urology;  Laterality: N/A;  . EXTRACORPOREAL SHOCK WAVE LITHOTRIPSY  2009  . INTRAUTERINE DEVICE INSERTION  MARCH 2012   MURINA  . URETERAL STENT PLACEMENT  1998  (APPROX)   Kidney Stone  . URETEROSCOPY WITH HOLMIUM LASER LITHOTRIPSY Left 02/02/2017   Procedure: URETEROSCOPY WITH HOLMIUM LASER LITHOTRIPSY;  Surgeon: Bjorn PippinWrenn, John, MD;  Location: [facility] ORS;  Service: Urology;  Laterality: Left;    Family History  Problem Relation Age of Onset  . Cancer Father        Lung  . Cancer Mother 640       ? breast age late 4840's  . Alcohol abuse Maternal Uncle   . Alcohol abuse Paternal Uncle   .  Cancer Maternal Grandmother        Ovarian  . Cancer Maternal Grandfather        Lymphoma  . Cancer Maternal Aunt 6870       great aunt breast late 2870's  . Alcohol abuse Paternal Grandfather   . Breast cancer Neg Hx     Social History Social History  Substance Use Topics  . Smoking status: Current Every Day Smoker    Packs/day: 0.50    Years: 16.00    Types: Cigarettes    Last attempt to quit: 06/25/2014  . Smokeless tobacco: Never Used  . Alcohol use 0.0 oz/week     Comment: RARE    Allergies  Allergen Reactions  . Dilaudid [Hydromorphone Hcl] Itching    Red face and itching  . Nitrofurantoin Hives    Macrobid  . Morphine And Related Hives and Rash    Current Outpatient Prescriptions  Medication Sig Dispense Refill  . acetaminophen (TYLENOL) 325 MG tablet Take 650 mg by mouth every 6 (six) hours as needed for headache.    Marland Kitchen. buPROPion (WELLBUTRIN XL) 300 MG 24 hr tablet Take 1 tablet (300 mg total) by mouth daily. 90 tablet 3  . clonazePAM (KLONOPIN) 0.5 MG tablet Take 1 tablet (0.5 mg total) by mouth  2 (two) times daily as needed for anxiety. 60 tablet 1  . ibuprofen (ADVIL,MOTRIN) 200 MG tablet Take 600-800 mg by mouth every 6 (six) hours as needed for headache.    . sulfamethoxazole-trimethoprim (BACTRIM DS,SEPTRA DS) 800-160 MG tablet Take 1 tablet by mouth 2 (two) times daily. 20 tablet 0  . raloxifene (EVISTA) 60 MG tablet Take 1 tablet (60 mg total) by mouth daily. 30 tablet 11   No current facility-administered medications for this visit.     Review of Systems Review of Systems  Constitutional: Negative.   Respiratory: Negative.   Cardiovascular: Negative.     Blood pressure 122/72, pulse 98, resp. rate 14, height 5\' 3"  (1.6 m), weight 204 lb (92.5 kg).  Physical Exam Physical Exam  Constitutional: She is oriented to person, place, and time. She appears well-developed.  Pulmonary/Chest:    Neurological: She is alert and oriented to person, place, and  time.  Skin: Skin is warm and dry.    Data Reviewed LCIS.  Assessment    LCIS, status post biopsy hematoma drainage without evidence of infection or recurrent hematoma.    Plan     Indication for chemoprevention reviewed. [name] model risk estimate 4.1% 5 year, 30.3% lifetime.  Potential role for screening MRI on a yearly/biannual basis reviewed.  Patient is amenable to a try love Evista 60 milligrams daily. No contraindications with her present Wellbutrin therapy. Potential side effects reviewed. Importance to take a medication for 1 month for an adequate while discussed.     Patient to return in one month to report her progress.  Local heat again encouraged to help resolve the residual thickening.  HPI, Physical Exam, Assessment and Plan have been scribed under the direction and in the presence of Donnalee Curry, MD.  Ples Specter, CMA  I have completed the exam and reviewed the above documentation for accuracy and completeness.  I agree with the above.  Museum/gallery conservator has been used and any errors in dictation or transcription are unintentional.  Donnalee Curry, M.D., F.A.C.S.  Earline Mayotte 04/01/2017, 9:50 PM

## 2017-04-14 ENCOUNTER — Ambulatory Visit: Payer: Self-pay

## 2017-04-28 ENCOUNTER — Telehealth: Payer: Self-pay | Admitting: General Surgery

## 2017-04-28 NOTE — Telephone Encounter (Signed)
I tried calling patient to ask them if they could move their appointment from 03-29-17 @ 4:30 to 10:45am(same day)

## 2017-04-29 ENCOUNTER — Ambulatory Visit (INDEPENDENT_AMBULATORY_CARE_PROVIDER_SITE_OTHER): Payer: BC Managed Care – PPO | Admitting: General Surgery

## 2017-04-29 ENCOUNTER — Encounter: Payer: Self-pay | Admitting: General Surgery

## 2017-04-29 ENCOUNTER — Telehealth: Payer: Self-pay | Admitting: Internal Medicine

## 2017-04-29 VITALS — BP 138/74 | HR 106 | Resp 12 | Ht 63.0 in | Wt 203.0 lb

## 2017-04-29 DIAGNOSIS — D0501 Lobular carcinoma in situ of right breast: Secondary | ICD-10-CM

## 2017-04-29 NOTE — Progress Notes (Signed)
Patient ID: Ariana Weeks, female   DOB: June 12, 1971, 46 y.o.   MRN: [id number]  Chief Complaint  Patient presents with  . Follow-up    HPI Ariana Weeks is a 46 y.o. female.  here today for her post op right breast wide excision done on 03/04/2017. Patient states she is doing well. Occasional sharp pain at surgery site that does not last. Tolerating Evista well.  HPI  Past Medical History:  Diagnosis Date  . Depression   . Frequent headaches   . Head cold   . Headache(784.0)   . History of frequent urinary tract infections   . History of kidney stones   . Kidney stones   . Left ureteral calculus     Past Surgical History:  Procedure Laterality Date  . BREAST BIOPSY Right 02/02/2013   [place] bx/clip-neg  . BREAST BIOPSY Right 02/19/2017   affirm, ATYPICAL LOBULAR HYPERPLASIA  . BREAST BIOPSY Right 03/04/2017   Procedure: BREAST BIOPSY WITH NEEDLE LOCALIZATION;  Surgeon: Earline Mayotte, MD;  Location: [facility];  Service: General;  Laterality: Right;  . BREAST LUMPECTOMY    . CESAREAN SECTION  2005  . CHOLECYSTECTOMY  09/2012  . CYSTOSCOPY W/ RETROGRADES N/A 02/02/2017   Procedure: CYSTOSCOPY WITH RETROGRADE PYELOGRAM;  Surgeon: Bjorn Pippin, MD;  Location: [facility];  Service: Urology;  Laterality: N/A;  . EXTRACORPOREAL SHOCK WAVE LITHOTRIPSY  2009  . INTRAUTERINE DEVICE INSERTION  MARCH 2012   MURINA  . URETERAL STENT PLACEMENT  1998  (APPROX)   Kidney Stone  . URETEROSCOPY WITH HOLMIUM LASER LITHOTRIPSY Left 02/02/2017   Procedure: URETEROSCOPY WITH HOLMIUM LASER LITHOTRIPSY;  Surgeon: Bjorn Pippin, MD;  Location: [facility];  Service: Urology;  Laterality: Left;    Family History  Problem Relation Age of Onset  . Cancer Father        Lung  . Cancer Mother 18       ? breast age late 83's  . Alcohol abuse Maternal Uncle   . Alcohol abuse Paternal Uncle   . Cancer Maternal Grandmother        Ovarian  . Cancer Maternal Grandfather        Lymphoma  . Cancer Maternal Aunt  4       great aunt breast late 93's  . Alcohol abuse Paternal Grandfather   . Breast cancer Neg Hx     Social History Social History  Substance Use Topics  . Smoking status: Current Every Day Smoker    Packs/day: 0.50    Years: 16.00    Types: Cigarettes    Last attempt to quit: 06/25/2014  . Smokeless tobacco: Never Used  . Alcohol use 0.0 oz/week     Comment: RARE    Allergies  Allergen Reactions  . Dilaudid [Hydromorphone Hcl] Itching    Red face and itching  . Nitrofurantoin Hives    Macrobid  . Morphine And Related Hives and Rash    Current Outpatient Prescriptions  Medication Sig Dispense Refill  . acetaminophen (TYLENOL) 325 MG tablet Take 650 mg by mouth every 6 (six) hours as needed for headache.    Marland Kitchen buPROPion (WELLBUTRIN XL) 300 MG 24 hr tablet Take 1 tablet (300 mg total) by mouth daily. 90 tablet 3  . clonazePAM (KLONOPIN) 0.5 MG tablet Take 1 tablet (0.5 mg total) by mouth 2 (two) times daily as needed for anxiety. 60 tablet 1  . ibuprofen (ADVIL,MOTRIN) 200 MG tablet Take 600-800 mg by mouth every 6 (six)  hours as needed for headache.    . raloxifene (EVISTA) 60 MG tablet Take 1 tablet (60 mg total) by mouth daily. 30 tablet 11   No current facility-administered medications for this visit.     Review of Systems Review of Systems  Constitutional: Negative.   Respiratory: Negative.   Cardiovascular: Negative.     Blood pressure 138/74, pulse (!) 106, resp. rate 12, height 5\' 3"  (1.6 m), weight 203 lb (92.1 kg).  Physical Exam Physical Exam  Constitutional: She is oriented to person, place, and time. She appears well-developed and well-nourished.  Pulmonary/Chest:    Mild bruising right breast remains  Neurological: She is alert and oriented to person, place, and time.  Skin: Skin is warm and dry.  Psychiatric: Her behavior is normal.      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    Plan     The patient's PCP had left the area. Dr. Tullo has agreed to assume the patient's primary care responsibility.    The patieDarrick Huntsmannt has been asked to return to the office in 9 months (June) with a bilateral screening mammogram. Continue Evista.  The patient is aware to call back for any questions or new concerns.   HPI, Physical Exam, Assessment and Plan have been scribed under the direction and in the presence of Earline MayotteJeffrey W. Byrnett, MD. Dorathy DaftMarsha Hatch, RN  I have completed the exam and reviewed the above documentation for accuracy and completeness.  I agree with the above.  [employer]useum[employer] has been used and any errors in dictation or transcription are unintentional.  Donnalee CurryJeffrey Byrnett, M.D., F.A.C.S. Earline MayotteByrnett, Jeffrey W 04/29/2017, 8:45 PM

## 2017-04-29 NOTE — Telephone Encounter (Signed)
Dr Lemar LivingsByrnett has asked me to take on this patient since she has had 2 providers leave  I said yes .  She saw Dr Adriana Simascook in May for depression, and has been diagnosed with breast cancer, so let her tell you how soon she needs to be seen and I will work her in

## 2017-04-29 NOTE — Patient Instructions (Addendum)
The patient is aware to call back for any questions or concerns.  The patient has been asked to return to the office in 9 months (June) with a bilateral screening mammogram.

## 2017-04-30 ENCOUNTER — Telehealth: Payer: Self-pay | Admitting: *Deleted

## 2017-04-30 NOTE — Telephone Encounter (Signed)
Notified patient as instructed, patient pleased. patient agrees  

## 2017-04-30 NOTE — Telephone Encounter (Signed)
-----   Message from Earline MayotteJeffrey W Byrnett, MD sent at 04/29/2017  8:47 PM EDT ----- Please notify the patient that Dr. Darrick Huntsmanullo has agreed to take on the patient as her new PCP.

## 2017-05-20 ENCOUNTER — Ambulatory Visit: Payer: Self-pay

## 2017-05-26 ENCOUNTER — Ambulatory Visit: Payer: Self-pay

## 2017-05-27 ENCOUNTER — Ambulatory Visit (INDEPENDENT_AMBULATORY_CARE_PROVIDER_SITE_OTHER): Payer: BC Managed Care – PPO | Admitting: Urology

## 2017-05-27 VITALS — BP 147/85 | HR 99 | Ht 63.0 in | Wt 202.8 lb

## 2017-05-27 DIAGNOSIS — N200 Calculus of kidney: Secondary | ICD-10-CM

## 2017-05-27 NOTE — Progress Notes (Signed)
05/27/2017 3:58 PM   Ariana Weeks 1971/06/03 [id number]  Referring provider: Tommie Sams, DO [address] Laurell [address] [place], [place] [address]  CC: Kidney stones  HPI: The patient is a 46 year old female with a significant past medical history of recurrent nephrolithiasis presents for follow-up after undergoing ureteroscopy in June 2018 for an 8 mm left proximal stone. She also has known nonobstructing stones bilaterally that weren't addressed. The largest is approximate 5 mm on the left. The largest on the right is approximately 3 mm. Her stone analysis was 80% calcium oxalate monohydrate, 5% calcium oxalate dihydrate, 15% calcium phosphate. As noted above, she has significant recurrent nephrolithiasis. She's had multiple ureteroscopy and lithotripsy procedures.  Post-instrumentation renal ultrasound was positive for the known 5 mm inter-pole left renal calculus and mild left hydronephrosis in July 2018.   PMH: Past Medical History:  Diagnosis Date  . Depression   . Frequent headaches   . Head cold   . Headache(784.0)   . History of frequent urinary tract infections   . History of kidney stones   . Kidney stones   . Left ureteral calculus     Surgical History: Past Surgical History:  Procedure Laterality Date  . BREAST BIOPSY Right 02/02/2013   [place] bx/clip-neg  . BREAST BIOPSY Right 02/19/2017   affirm, ATYPICAL LOBULAR HYPERPLASIA  . BREAST BIOPSY Right 03/04/2017   Procedure: BREAST BIOPSY WITH NEEDLE LOCALIZATION;  Surgeon: Earline Mayotte, MD;  Location: [facility];  Service: General;  Laterality: Right;  . BREAST LUMPECTOMY    . CESAREAN SECTION  2005  . CHOLECYSTECTOMY  09/2012  . CYSTOSCOPY W/ RETROGRADES N/A 02/02/2017   Procedure: CYSTOSCOPY WITH RETROGRADE PYELOGRAM;  Surgeon: Bjorn Pippin, MD;  Location: [facility];  Service: Urology;  Laterality: N/A;  . EXTRACORPOREAL SHOCK WAVE LITHOTRIPSY  2009  . INTRAUTERINE DEVICE INSERTION  MARCH 2012   MURINA  .  URETERAL STENT PLACEMENT  1998  (APPROX)   Kidney Stone  . URETEROSCOPY WITH HOLMIUM LASER LITHOTRIPSY Left 02/02/2017   Procedure: URETEROSCOPY WITH HOLMIUM LASER LITHOTRIPSY;  Surgeon: Bjorn Pippin, MD;  Location: [facility];  Service: Urology;  Laterality: Left;    Home Medications:  Allergies as of 05/27/2017      Reactions   Dilaudid [hydromorphone Hcl] Itching   Red face and itching   Nitrofurantoin Hives   Macrobid   Morphine And Related Hives, Rash      Medication List       Accurate as of 05/27/17  3:58 PM. Always use your most recent med list.          acetaminophen 325 MG tablet Commonly known as:  TYLENOL Take 650 mg by mouth every 6 (six) hours as needed for headache.   buPROPion 300 MG 24 hr tablet Commonly known as:  WELLBUTRIN XL Take 1 tablet (300 mg total) by mouth daily.   clonazePAM 0.5 MG tablet Commonly known as:  KLONOPIN Take 1 tablet (0.5 mg total) by mouth 2 (two) times daily as needed for anxiety.   ibuprofen 200 MG tablet Commonly known as:  ADVIL,MOTRIN Take 600-800 mg by mouth every 6 (six) hours as needed for headache.   raloxifene 60 MG tablet Commonly known as:  EVISTA Take 1 tablet (60 mg total) by mouth daily.       Allergies:  Allergies  Allergen Reactions  . Dilaudid [Hydromorphone Hcl] Itching    Red face and itching  . Nitrofurantoin Hives    Macrobid  .  Morphine And Related Hives and Rash    Family History: Family History  Problem Relation Age of Onset  . Cancer Father        Lung  . Cancer Mother 45       ? breast age late 14's  . Alcohol abuse Maternal Uncle   . Alcohol abuse Paternal Uncle   . Cancer Maternal Grandmother        Ovarian  . Cancer Maternal Grandfather        Lymphoma  . Cancer Maternal Aunt 45       great aunt breast late 17's  . Alcohol abuse Paternal Grandfather   . Breast cancer Neg Hx     Social History:  reports that she has been smoking Cigarettes.  She has a 8.00 pack-year smoking  history. She has never used smokeless tobacco. She reports that she drinks alcohol. She reports that she does not use drugs.  ROS: UROLOGY Frequent Urination?: Yes Hard to postpone urination?: No Burning/pain with urination?: No Get up at night to urinate?: Yes Leakage of urine?: No Urine stream starts and stops?: No Trouble starting stream?: No Do you have to strain to urinate?: No Blood in urine?: No Urinary tract infection?: No Sexually transmitted disease?: No Injury to kidneys or bladder?: No Painful intercourse?: No Weak stream?: No Currently pregnant?: No Vaginal bleeding?: No Last menstrual period?: n  Gastrointestinal Nausea?: No Vomiting?: No Indigestion/heartburn?: No Diarrhea?: No Constipation?: No  Constitutional Fever: No Night sweats?: No Weight loss?: No Fatigue?: No  Skin Skin rash/lesions?: No Itching?: No  Eyes Blurred vision?: No Double vision?: No  Ears/Nose/Throat Sore throat?: No Sinus problems?: No  Hematologic/Lymphatic Swollen glands?: No Easy bruising?: No  Cardiovascular Leg swelling?: No Chest pain?: No  Respiratory Cough?: No Shortness of breath?: No  Endocrine Excessive thirst?: No  Musculoskeletal Back pain?: No Joint pain?: No  Neurological Headaches?: No Dizziness?: No  Psychologic Depression?: Yes Anxiety?: Yes  Physical Exam: BP (!) 147/85 (BP Location: Right Arm, Patient Position: Sitting, Cuff Size: Large)   Pulse 99   Ht  (1.6 m)   Wt 202 lb 12.8 oz (92 kg)   BMI 35.92 kg/m   Constitutional:  Alert and oriented, No acute distress. HEENT: [place] AT, moist mucus membranes.  Trachea midline, no masses. Cardiovascular: No clubbing, cyanosis, or edema. Respiratory: Normal respiratory effort, no increased work of breathing. GI: Abdomen is soft, nontender, nondistended, no abdominal masses GU: No CVA tenderness.  Skin: No rashes, bruises or suspicious lesions. Lymph: No cervical or inguinal  adenopathy. Neurologic: Grossly intact, no focal deficits, moving all 4 extremities. Psychiatric: Normal mood and affect.  Laboratory Data: Lab Results  Component Value Date   WBC 10.1 03/15/2017   HGB 14.1 03/15/2017   HCT 40.1 03/15/2017   MCV 89.3 03/15/2017   PLT 231 03/15/2017    Lab Results  Component Value Date   CREATININE 0.81 03/15/2017    No results found for: PSA  No results found for: TESTOSTERONE  Lab Results  Component Value Date   HGBA1C 5.4 06/14/2015    Urinalysis    Component Value Date/Time   COLORURINE YELLOW (A) 02/02/2017 1016   APPEARANCEUR HAZY (A) 02/02/2017 1016   APPEARANCEUR Clear 03/24/2014 1107   LABSPEC 1.024 02/02/2017 1016   LABSPEC 1.005 03/24/2014 1107   PHURINE 5.0 02/02/2017 1016   GLUCOSEU NEGATIVE 02/02/2017 1016   GLUCOSEU Negative 03/24/2014 1107   HGBUR LARGE (A) 02/02/2017 1016   HGBUR large 08/23/2009  1126   BILIRUBINUR NEGATIVE 02/02/2017 1016   BILIRUBINUR neg 08/14/2015 1020   BILIRUBINUR Negative 03/24/2014 1107   KETONESUR NEGATIVE 02/02/2017 1016   PROTEINUR 30 (A) 02/02/2017 1016   UROBILINOGEN 0.2 08/14/2015 1020   UROBILINOGEN 0.2 08/23/2009 1126   NITRITE NEGATIVE 02/02/2017 1016   LEUKOCYTESUR NEGATIVE 02/02/2017 1016   LEUKOCYTESUR Negative 03/24/2014 1107    Pertinent Imaging: Renal u/s reviewed as above  Assessment & Plan:    1. Recurrent nephrolithiasis The patient does have significant recurrent nephrolithiasis. We discussed the metabolic workup with a 24-hour urine study and laboratory tests to check for correctable metabolic defects. We also discussed her stone analysis. I did not give her the ABCs of stone formation booklet in order to not skew her urine study above. We'll discuss this after he24-hour urine study in approximately 6 weeks when completed. We'll also plan to order a renal ultrasound prior to this follow-up to ensure the residual left hydronephrosis resolves. She will need to  monitor the size of the smaller remaining stone burden versus go under to definitive surgical management in the future.  Return in about 6 weeks (around 07/08/2017) for with litholink and renal u/s prior.  Hildred Laser, MD  [facility] [address], [address] [place], [place] [address] [phone]

## 2017-06-01 ENCOUNTER — Other Ambulatory Visit: Payer: BC Managed Care – PPO

## 2017-06-08 ENCOUNTER — Other Ambulatory Visit: Payer: Self-pay | Admitting: Urology

## 2017-06-23 ENCOUNTER — Ambulatory Visit (INDEPENDENT_AMBULATORY_CARE_PROVIDER_SITE_OTHER): Payer: BC Managed Care – PPO

## 2017-06-23 ENCOUNTER — Ambulatory Visit (INDEPENDENT_AMBULATORY_CARE_PROVIDER_SITE_OTHER): Payer: BC Managed Care – PPO | Admitting: Internal Medicine

## 2017-06-23 ENCOUNTER — Encounter: Payer: Self-pay | Admitting: Internal Medicine

## 2017-06-23 VITALS — BP 118/82 | HR 58 | Temp 98.0°F | Resp 15 | Ht 63.0 in | Wt 199.8 lb

## 2017-06-23 DIAGNOSIS — M25571 Pain in right ankle and joints of right foot: Secondary | ICD-10-CM

## 2017-06-23 DIAGNOSIS — G8929 Other chronic pain: Secondary | ICD-10-CM | POA: Diagnosis not present

## 2017-06-23 DIAGNOSIS — F419 Anxiety disorder, unspecified: Secondary | ICD-10-CM | POA: Diagnosis not present

## 2017-06-23 DIAGNOSIS — F329 Major depressive disorder, single episode, unspecified: Secondary | ICD-10-CM

## 2017-06-23 DIAGNOSIS — R5383 Other fatigue: Secondary | ICD-10-CM

## 2017-06-23 DIAGNOSIS — Z23 Encounter for immunization: Secondary | ICD-10-CM | POA: Diagnosis not present

## 2017-06-23 DIAGNOSIS — F32A Depression, unspecified: Secondary | ICD-10-CM

## 2017-06-23 MED ORDER — PAROXETINE HCL 10 MG PO TABS: 10.0000 mg | ORAL_TABLET | Freq: Every day | ORAL | 5 refills | Status: DC

## 2017-06-23 NOTE — Progress Notes (Signed)
Subjective:  Patient ID: Ariana Weeks, female    DOB: 05/27/1971  Age: 46 y.o. MRN: [id number]  CC: The primary encounter diagnosis was Acute right ankle pain. Diagnoses of Need for immunization against influenza, Fatigue, unspecified type, Anxiety and depression, and Chronic pain of right ankle were also pertinent to this visit.  HPI Ariana Weeks presents for transfer of care from Dr. Adriana Simas.  1) 2 month history f right ankle pain and swelling .  Patient states that her symptoms started with swelling. Swelling does  not change with daytime or nightt time or with elevation of the leg..   She describes the pain is localized to the lateral side of her foot.  The pain is not constant.  It will throb and usually starts hurting by noon but she occasionally is woken up in the middle the night with pain as well.  No history of fall or unusual activity.  The warmth or redness to any joint in her foot.  The pain is not aggravated by certain positions or by certain shoes.   2) depression/anxiety.    She has been taking wellbutrin for years.  She cites several stressors.  Her symptoms became worse when she gave birth to a child with Down syndrome 3 years ago.  He is a full time Special needs teacher, has a teenage son and a husband who do not know how to help her at home although the appear to be supportive having difficulty delegating tasks to them, and when she does she finds fault with the way they do them.  Does not feel angry at work but feels angry when she comes home.  She is resentful of the fact that her husband and son take camping trips and she is left with the 40-year-old to care for her.  Has not been able to join her husband and son for any of her son's extracurricular ballgames because her special needs child does not tolerate crowds.  Has been trying to exercise and eat better.  Too tired to get up I am because up with the baby.     Outpatient Medications Prior to Visit  Medication Sig Dispense  Refill  . acetaminophen (TYLENOL) 325 MG tablet Take 650 mg by mouth every 6 (six) hours as needed for headache.    Marland Kitchen buPROPion (WELLBUTRIN XL) 300 MG 24 hr tablet Take 1 tablet (300 mg total) by mouth daily. 90 tablet 3  . ibuprofen (ADVIL,MOTRIN) 200 MG tablet Take 600-800 mg by mouth every 6 (six) hours as needed for headache.    . raloxifene (EVISTA) 60 MG tablet Take 1 tablet (60 mg total) by mouth daily. 30 tablet 11  . clonazePAM (KLONOPIN) 0.5 MG tablet Take 1 tablet (0.5 mg total) by mouth 2 (two) times daily as needed for anxiety. (Patient not taking: Reported on 05/27/2017) 60 tablet 1   No facility-administered medications prior to visit.     Review of Systems;  Patient denies headache, fevers, malaise, unintentional weight loss, skin rash, eye pain, sinus congestion and sinus pain, sore throat, dysphagia,  hemoptysis , cough, dyspnea, wheezing, chest pain, palpitations, orthopnea, edema, abdominal pain, nausea, melena, diarrhea, constipation, flank pain, dysuria, hematuria, urinary  Frequency, nocturia, numbness, tingling, seizures,  Focal weakness, Loss of consciousness,  Tremor, insomnia, and suicidal ideation.      Objective:  BP 118/82 (BP Location: Left Arm, Patient Position: Sitting, Cuff Size: Large)   Pulse (!) 58   Temp 98 F (36.7 C) (  Oral)   Resp 15   Ht 5\' 3"  (1.6 m)   Wt 199 lb 12.8 oz (90.6 kg)   SpO2 98%   BMI 35.39 kg/m   BP Readings from Last 3 Encounters:  06/23/17 118/82  05/27/17 (!) 147/85  04/29/17 138/74    Wt Readings from Last 3 Encounters:  06/23/17 199 lb 12.8 oz (90.6 kg)  05/27/17 202 lb 12.8 oz (92 kg)  04/29/17 203 lb (92.1 kg)    General appearance: alert, cooperative and appears stated age  Neck: no adenopathy, no carotid bruit, supple, symmetrical, trachea midline and thyroid not enlarged, symmetric, no tenderness/mass/nodules Back: symmetric, no curvature. ROM normal. No CVA tenderness. Lungs: clear to auscultation  bilaterally Heart: regular rate and rhythm, S1, S2 normal, no murmur, click, rub or gallop Abdomen: soft, non-tender; bowel sounds normal; no masses,  no organomegaly Pulses: 2+ and symmetric Skin: Skin color, texture, turgor normal. No rashes or lesions Lymph nodes: Cervical, supraclavicular, and axillary nodes normal. Psych: affect flat but alternates with angry,  Tense  makes good eye contact. No fidgeting,  Does not smile.  Speech is direct and forceful but not pressured.   Denies suicidal thoughts   Lab Results  Component Value Date   HGBA1C 5.4 06/14/2015    Lab Results  Component Value Date   CREATININE 0.81 03/15/2017   CREATININE 1.14 (H) 02/03/2017   CREATININE 1.29 (H) 02/02/2017    Lab Results  Component Value Date   WBC 7.2 06/23/2017   HGB 14.1 06/23/2017   HCT 42.5 06/23/2017   PLT 238.0 06/23/2017   GLUCOSE 142 (H) 03/15/2017   ALT 14 02/02/2017   AST 18 02/02/2017   NA 138 03/15/2017   K 3.5 03/15/2017   CL 105 03/15/2017   CREATININE 0.81 03/15/2017   BUN 12 03/15/2017   CO2 25 03/15/2017   TSH 2.46 06/23/2017   HGBA1C 5.4 06/14/2015    No results found.  Assessment & Plan:   Problem List Items Addressed This Visit    Anxiety and depression    Discussed the fact that the Wellbutrin dose may be actually aggravating her symptoms of resentment and anger. .  Adding Paxil.  plan to reduce the Wellbutrin dose in several weeks.  Strongly advised her to attend a support group for parents of his Down's children as well as to consider entering into psychotherapy.  The names of several females therapists in the area were given to her.  Encouraged her to create a list of tasks that she can delegate to her son and husband and to allow for imperfections in their performance of duties to encourage them to seek out more ways they can help her.  Also recommended that she devote 30 minutes several days a week to exercise      Relevant Medications   PARoxetine (PAXIL)  10 MG tablet   Chronic pain of right ankle    Accompanied by swelling and pain on the lateral side of her hindfoot.  Plain films today suggest a calcaneal spur which may be creating a tendinosis.  Podiatry referral recommended.       Other Visit Diagnoses    Acute right ankle pain    -  Primary   Relevant Orders   DG Ankle Complete Right (Completed)   Uric acid (Completed)   Need for immunization against influenza       Relevant Orders   Flu Vaccine QUAD 36+ mos IM (Completed)   Fatigue, unspecified type  Relevant Orders   Sedimentation rate (Completed)   CBC with Differential/Platelet (Completed)   TSH (Completed)      ,A total of 40 minutes was spent with patient more than half of which was spent in counseling patient on the above mentioned issues , reviewing and explaining recent labs and imaging studies done today , and coordination of care. I have discontinued Ms. Rebman's clonazePAM. I am also having her start on PARoxetine. Additionally, I am having her maintain her buPROPion, acetaminophen, ibuprofen, and raloxifene.  Meds ordered this encounter  Medications  . PARoxetine (PAXIL) 10 MG tablet    Sig: Take 1 tablet (10 mg total) by mouth daily.    Dispense:  30 tablet    Refill:  5    Medications Discontinued During This Encounter  Medication Reason  . clonazePAM (KLONOPIN) 0.5 MG tablet Patient has not taken in last 30 days    Follow-up: No Follow-up on file.   Sherlene ShamsULLO, Duaa Stelzner L, MD

## 2017-06-23 NOTE — Patient Instructions (Addendum)
I am recommending add Paxil to your wellbutrin.  Please start the medication at 1/2 tablet daily in the evening for the first few days to avoid nausea.  You can increase to a full tablet after 4 days if you have not developed side effects of nausea.  If the paxil  interferes with your sleep, take it in the morning instead  Please return in  4 weeks ,  Or e mail me to let me know how it is helping your depression    Here are the names of several well respected female therapists   Montel ClockKatherine Wagner    [phone]  [place]   [phone]  [place] Valerie [place] [phone]  [place] Jane Perrin  [phone]  Jud[place]ann [place]en[place]

## 2017-06-24 LAB — CBC WITH DIFFERENTIAL/PLATELET
BASOS PCT: 1.7 % (ref 0.0–3.0)
Basophils Absolute: 0.1 10*3/uL (ref 0.0–0.1)
EOS ABS: 0.1 10*3/uL (ref 0.0–0.7)
Eosinophils Relative: 1.2 % (ref 0.0–5.0)
HCT: 42.5 % (ref 36.0–46.0)
HEMOGLOBIN: 14.1 g/dL (ref 12.0–15.0)
Lymphocytes Relative: 34.1 % (ref 12.0–46.0)
Lymphs Abs: 2.4 10*3/uL (ref 0.7–4.0)
MCHC: 33.3 g/dL (ref 30.0–36.0)
MCV: 91.6 fl (ref 78.0–100.0)
MONO ABS: 0.6 10*3/uL (ref 0.1–1.0)
Monocytes Relative: 8.3 % (ref 3.0–12.0)
Neutro Abs: 3.9 10*3/uL (ref 1.4–7.7)
Neutrophils Relative %: 54.7 % (ref 43.0–77.0)
PLATELETS: 238 10*3/uL (ref 150.0–400.0)
RBC: 4.64 Mil/uL (ref 3.87–5.11)
RDW: 13.1 % (ref 11.5–15.5)
WBC: 7.2 10*3/uL (ref 4.0–10.5)

## 2017-06-24 LAB — TSH: TSH: 2.46 u[IU]/mL (ref 0.35–4.50)

## 2017-06-24 LAB — SEDIMENTATION RATE: SED RATE: 8 mm/h (ref 0–20)

## 2017-06-24 LAB — URIC ACID: Uric Acid, Serum: 5.7 mg/dL (ref 2.4–7.0)

## 2017-06-25 DIAGNOSIS — M25571 Pain in right ankle and joints of right foot: Secondary | ICD-10-CM

## 2017-06-25 DIAGNOSIS — G8929 Other chronic pain: Secondary | ICD-10-CM | POA: Insufficient documentation

## 2017-06-25 NOTE — Assessment & Plan Note (Addendum)
Discussed the fact that the Wellbutrin dose may be actually aggravating her symptoms of resentment and anger. .  Adding Paxil.  plan to reduce the Wellbutrin dose in several weeks.  Strongly advised her to attend a support group for parents of his Down's children as well as to consider entering into psychotherapy.  The names of several females therapists in the area were given to her.  Encouraged her to create a list of tasks that she can delegate to her son and husband and to allow for imperfections in their performance of duties to encourage them to seek out more ways they can help her.  Also recommended that she devote 30 minutes several days a week to exercise

## 2017-06-25 NOTE — Assessment & Plan Note (Signed)
Accompanied by swelling and pain on the lateral side of her hindfoot.  Plain films today suggest a calcaneal spur which may be creating a tendinosis.  Podiatry referral recommended.

## 2017-06-26 ENCOUNTER — Ambulatory Visit
Admission: RE | Admit: 2017-06-26 | Discharge: 2017-06-26 | Disposition: A | Discharge status: Home / Self Care | Payer: BC Managed Care – PPO | Source: Ambulatory Visit | Attending: Urology | Admitting: Urology

## 2017-06-26 DIAGNOSIS — N200 Calculus of kidney: Secondary | ICD-10-CM | POA: Diagnosis present

## 2017-06-29 ENCOUNTER — Other Ambulatory Visit: Payer: Self-pay | Admitting: Internal Medicine

## 2017-06-29 ENCOUNTER — Telehealth: Payer: Self-pay | Admitting: Internal Medicine

## 2017-06-29 DIAGNOSIS — G8929 Other chronic pain: Secondary | ICD-10-CM

## 2017-06-29 DIAGNOSIS — M25571 Pain in right ankle and joints of right foot: Principal | ICD-10-CM

## 2017-06-29 NOTE — Telephone Encounter (Signed)
Pt called back after receiving message from [place], LPN at the office to return phone call regarding dosage of Paxil. Pt states she took a whole tablet of the medication on the first evening that she had the prescription and stopped taking it afterwards due to the medication making her feel lightheaded and weak with a dry mouth. Conference call initiated with Olegario MessierKathy, LPN so that further instructions could be given to the pt.

## 2017-07-01 ENCOUNTER — Encounter: Payer: Self-pay | Admitting: Internal Medicine

## 2017-07-10 ENCOUNTER — Ambulatory Visit: Payer: BC Managed Care – PPO | Admitting: Urology

## 2017-07-10 VITALS — BP 159/90 | HR 90 | Ht 63.0 in | Wt 196.7 lb

## 2017-07-10 DIAGNOSIS — N200 Calculus of kidney: Secondary | ICD-10-CM | POA: Diagnosis not present

## 2017-07-10 DIAGNOSIS — N3281 Overactive bladder: Secondary | ICD-10-CM

## 2017-07-10 MED ORDER — POTASSIUM CITRATE ER 15 MEQ (1620 MG) PO TBCR: 2.0000 | EXTENDED_RELEASE_TABLET | Freq: Two times a day (BID) | ORAL | 11 refills | Status: DC

## 2017-07-10 MED ORDER — MIRABEGRON ER 25 MG PO TB24: 25.0000 mg | ORAL_TABLET | Freq: Every day | ORAL | 11 refills | Status: DC

## 2017-07-10 NOTE — Progress Notes (Signed)
07/10/2017 4:22 PM   Ariana Weeks Aug 23, 1971 [id number]  Referring provider: Sherlene Shamsullo, Teresa L, MD [address] [place], [place] [id number]  No chief complaint on file.   HPI: The patient is a 46 year old female with a significant past medical history of recurrent nephrolithiasis presents for follow-up after undergoing ureteroscopy in June 2018 for an 8 mm left proximal stone. She also has known nonobstructing stones bilaterally that weren't addressed. The largest is approximate 5 mm on the left. The largest on the right is approximately 3 mm. Her stone analysis was 80% calcium oxalate monohydrate, 5% calcium oxalate dihydrate, 15% calcium phosphate. As noted above, she has significant recurrent nephrolithiasis. She's had multiple ureteroscopy and lithotripsy procedures.    Post-instrumentation renal ultrasound was positive for the known 5 mm inter-pole left renal calculus and mild left hydronephrosis in July 2018. This resolved on repeat renal ultrasound in November 2018.  She underwent a 24-hour urinalysis.  He is positive for low urine volume at 1.7 L.  Her urine pH was 5.756.  She had borderline hypocitraturia the urinary citrate of 479.  She also had borderline elevated urinary calcium at 232.   The patient also complains today of urinary urgency with urge incontinence.  She also has frequency.  She is to go the bathroom sometimes every half hour.  On repeat trip she often voids only small amounts.  She does have severe urgency with quite frequent urge incontinence.  She feels that she empties her bladder.  She has no dysuria.  She has never tried medications for this before.   PMH: Past Medical History:  Diagnosis Date  . Depression   . Frequent headaches   . Head cold   . Headache(784.0)   . History of frequent urinary tract infections   . History of kidney stones   . Kidney stones   . Left ureteral calculus     Surgical History: Past Surgical History:    Procedure Laterality Date  . BREAST BIOPSY Right 02/02/2013   us bx/clip-neg  . BREAST BIOPSY Right 02/19/2017   affirm, ATYPICAL LOBULAR HYPERPLASIA  . BREAST BIOPSY WITH NEEDLE LOCALIZATION Right 03/04/2017   Performed by Earline MayotteByrnett, Jeffrey W, MD [facility]  . BREAST LUMPECTOMY    . CESAREAN SECTION  2005  . CHOLECYSTECTOMY  09/2012  . CYSTOSCOPY WITH RETROGRADE PYELOGRAM N/A 02/02/2017   Performed by Bjorn PippinWrenn, John, MD [facility]  . CYSTOSCOPY WITH RETROGRADE PYELOGRAM, URETEROSCOPY AND STENT PLACEMENT Left 05/26/2012   Performed by Valetta FullerGrapey, David S, MD [facility]  . EXTRACORPOREAL SHOCK WAVE LITHOTRIPSY  2009  . INTRAUTERINE DEVICE INSERTION  MARCH 2012   MURINA  . URETERAL STENT PLACEMENT  1998  (APPROX)   Kidney Stone  . URETEROSCOPY WITH HOLMIUM LASER LITHOTRIPSY Left 02/02/2017   Performed by Bjorn PippinWrenn, John, MD [facility]    Home Medications:  Allergies as of 07/10/2017      Reactions   Dilaudid [hydromorphone Hcl] Itching   Red face and itching   Azithromycin    Other reaction(s): Unknown   Nitrofurantoin Hives   Macrobid   Morphine And Related Hives, Rash      Medication List        Accurate as of 07/10/17  4:22 PM. Always use your most recent med list.          acetaminophen 325 MG tablet Commonly known as:  TYLENOL Take 650 mg by mouth every 6 (six) hours as  needed for headache.   buPROPion 300 MG 24 hr tablet Commonly known as:  WELLBUTRIN XL Take 1 tablet (300 mg total) by mouth daily.   ibuprofen 200 MG tablet Commonly known as:  ADVIL,MOTRIN Take 600-800 mg by mouth every 6 (six) hours as needed for headache.   mirabegron ER 25 MG Tb24 tablet Commonly known as:  MYRBETRIQ Take 1 tablet (25 mg total) daily by mouth.   PARoxetine 10 MG tablet Commonly known as:  PAXIL Take 1 tablet (10 mg total) by mouth daily.   Potassium Citrate 15 MEQ (1620 MG) Tbcr Take 2 tablets 2 (two) times daily by mouth.   raloxifene 60 MG  tablet Commonly known as:  EVISTA Take 1 tablet (60 mg total) by mouth daily.       Allergies:  Allergies  Allergen Reactions  . Dilaudid [Hydromorphone Hcl] Itching    Red face and itching  . Azithromycin     Other reaction(s): Unknown  . Nitrofurantoin Hives    Macrobid  . Morphine And Related Hives and Rash    Family History: Family History  Problem Relation Age of Onset  . Cancer Father        Lung  . Cancer Mother 1840       ? breast age late 3340's  . Alcohol abuse Maternal Uncle   . Alcohol abuse Paternal Uncle   . Cancer Maternal Grandmother        Ovarian  . Cancer Maternal Grandfather        Lymphoma  . Cancer Maternal Aunt 3870       great aunt breast late 8170's  . Alcohol abuse Paternal Grandfather   . Breast cancer Neg Hx     Social History:  reports that she has been smoking cigarettes.  She has a 8.00 pack-year smoking history. she has never used smokeless tobacco. She reports that she drinks alcohol. She reports that she does not use drugs.  ROS: UROLOGY Frequent Urination?: Yes Hard to postpone urination?: No Burning/pain with urination?: No Get up at night to urinate?: Yes Leakage of urine?: No Urine stream starts and stops?: No Trouble starting stream?: No Do you have to strain to urinate?: No Blood in urine?: No Urinary tract infection?: No Sexually transmitted disease?: No Injury to kidneys or bladder?: No Painful intercourse?: No Weak stream?: No Currently pregnant?: No Vaginal bleeding?: No Last menstrual period?: n  Gastrointestinal Nausea?: No Vomiting?: No Indigestion/heartburn?: No Diarrhea?: No Constipation?: No  Constitutional Fever: No Night sweats?: No Weight loss?: No Fatigue?: No  Skin Skin rash/lesions?: No Itching?: No  Eyes Blurred vision?: No Double vision?: No  Ears/Nose/Throat Sore throat?: No Sinus problems?: No  Hematologic/Lymphatic Swollen glands?: No Easy bruising?: No  Cardiovascular Leg  swelling?: No Chest pain?: No  Respiratory Cough?: No Shortness of breath?: No  Endocrine Excessive thirst?: No  Musculoskeletal Back pain?: Yes Joint pain?: No  Neurological Headaches?: No Dizziness?: No  Psychologic Depression?: Yes Anxiety?: Yes  Physical Exam: BP (!) 159/90   Pulse 90   Ht 5\' 3"  (1.6 m)   Wt 196 lb 11.2 oz (89.2 kg)   BMI 34.84 kg/m   Constitutional:  Alert and oriented, No acute distress. HEENT: [place] AT, moist mucus membranes.  Trachea midline, no masses. Cardiovascular: No clubbing, cyanosis, or edema. Respiratory: Normal respiratory effort, no increased work of breathing. GI: Abdomen is soft, nontender, nondistended, no abdominal masses GU: No CVA tenderness.  Skin: No rashes, bruises or suspicious lesions. Lymph: No  cervical or inguinal adenopathy. Neurologic: Grossly intact, no focal deficits, moving all 4 extremities. Psychiatric: Normal mood and affect.  Laboratory Data: Lab Results  Component Value Date   WBC 7.2 06/23/2017   HGB 14.1 06/23/2017   HCT 42.5 06/23/2017   MCV 91.6 06/23/2017   PLT 238.0 06/23/2017    Lab Results  Component Value Date   CREATININE 0.81 03/15/2017    No results found for: PSA  No results found for: TESTOSTERONE  Lab Results  Component Value Date   HGBA1C 5.4 06/14/2015    Urinalysis    Component Value Date/Time   COLORURINE YELLOW (A) 02/02/2017 1016   APPEARANCEUR HAZY (A) 02/02/2017 1016   APPEARANCEUR Clear 03/24/2014 1107   LABSPEC 1.024 02/02/2017 1016   LABSPEC 1.005 03/24/2014 1107   PHURINE 5.0 02/02/2017 1016   GLUCOSEU NEGATIVE 02/02/2017 1016   GLUCOSEU Negative 03/24/2014 1107   HGBUR LARGE (A) 02/02/2017 1016   HGBUR large 08/23/2009 1126   BILIRUBINUR NEGATIVE 02/02/2017 1016   BILIRUBINUR neg 08/14/2015 1020   BILIRUBINUR Negative 03/24/2014 1107   KETONESUR NEGATIVE 02/02/2017 1016   PROTEINUR 30 (A) 02/02/2017 1016   UROBILINOGEN 0.2 08/14/2015 1020    UROBILINOGEN 0.2 08/23/2009 1126   NITRITE NEGATIVE 02/02/2017 1016   LEUKOCYTESUR NEGATIVE 02/02/2017 1016   LEUKOCYTESUR Negative 03/24/2014 1107    Pertinent Imaging: Renal ultrasound reviewed with resolution of hydronephrosis and bilateral solitary nonobstructing stones.  Assessment & Plan:    1.  Recurrent nephrolithiasis I discussed the results of the patient's urodynamic studies with her.  At this time I recommended that she begin potassium citrate 30 mEq 2 times per day.  Per AUA guidelines, I recommended that we will repeat her 24-hour urine study after 3 months of therapy.  She will follow-up at that time.  She is also encouraged to significantly increase her intake to increase her urine output to at least 2.5 L/day per  2.  Bilateral nonobstructing stones We will need to monitor with repeat KUB in 1 year.  3.  Urinary urgency with urge incontinence We will start the patient on Myrbetriq 25 mg daily.  If this is cost prohibitive, we can switch her to an anticholinergic.  Return in about 3 months (around 10/10/2017) for litholink just prior.  Hildred Laser, MD  [facility] [address], [address] [place], [place] [address] [phone]

## 2017-07-28 ENCOUNTER — Ambulatory Visit (INDEPENDENT_AMBULATORY_CARE_PROVIDER_SITE_OTHER): Payer: BC Managed Care – PPO | Admitting: Obstetrics and Gynecology

## 2017-07-28 ENCOUNTER — Encounter: Payer: Self-pay | Admitting: Obstetrics and Gynecology

## 2017-07-28 VITALS — BP 142/98 | HR 78 | Temp 98.5°F | Ht 62.0 in | Wt 196.0 lb

## 2017-07-28 DIAGNOSIS — R1033 Periumbilical pain: Secondary | ICD-10-CM | POA: Diagnosis not present

## 2017-07-28 DIAGNOSIS — N3001 Acute cystitis with hematuria: Secondary | ICD-10-CM

## 2017-07-28 DIAGNOSIS — R030 Elevated blood-pressure reading, without diagnosis of hypertension: Secondary | ICD-10-CM

## 2017-07-28 LAB — POCT URINALYSIS DIPSTICK
BILIRUBIN UA: NEGATIVE
GLUCOSE UA: NEGATIVE
KETONES UA: NEGATIVE
Nitrite, UA: NEGATIVE
PH UA: 6 (ref 5.0–8.0)
Protein, UA: NEGATIVE
Spec Grav, UA: 1.015 (ref 1.010–1.025)

## 2017-07-28 MED ORDER — CIPROFLOXACIN HCL 500 MG PO TABS: 500.0000 mg | ORAL_TABLET | Freq: Two times a day (BID) | ORAL | 0 refills | Status: DC

## 2017-07-28 NOTE — Patient Instructions (Signed)
I value your feedback and entrusting us with your care. If you get a [facility] patient survey, I would appreciate you taking the time to let us know about your experience today. Thank you! 

## 2017-07-28 NOTE — Progress Notes (Signed)
Chief Complaint  Patient presents with  . Urinary Tract Infection    stomach hurts; frequency; doesn't feel like bladder empties    HPI:      [name] is a 46 y.o. [id number] who LMP was No LMP recorded. Patient is not currently having periods (Reason: IUD)., presents today for UTI sx of urinary urgency/frequency/LBP/"feeling sick to her stomach" since this AM. No dysuria/hematuria. Pt has IUD and spots occasionally but no VB today. No vag sx. No n/v/d/constipation sx although norovirus going around pt's household this past wknd.  Hx of UTIs and kidney stones.   Past Medical History:  Diagnosis Date  . Depression   . Frequent headaches   . Head cold   . Headache(784.0)   . History of frequent urinary tract infections   . History of kidney stones   . Kidney stones   . Left ureteral calculus     Past Surgical History:  Procedure Laterality Date  . BREAST BIOPSY Right 02/02/2013   us bx/clip-neg  . BREAST BIOPSY Right 02/19/2017   affirm, ATYPICAL LOBULAR HYPERPLASIA  . BREAST BIOPSY Right 03/04/2017   Procedure: BREAST BIOPSY WITH NEEDLE LOCALIZATION;  Surgeon: Earline MayotteByrnett, Jeffrey W, MD;  Location: [facility] ORS;  Service: General;  Laterality: Right;  . BREAST LUMPECTOMY    . CESAREAN SECTION  2005  . CHOLECYSTECTOMY  09/2012  . CYSTOSCOPY W/ RETROGRADES N/A 02/02/2017   Procedure: CYSTOSCOPY WITH RETROGRADE PYELOGRAM;  Surgeon: Bjorn PippinWrenn, John, MD;  Location: [facility] ORS;  Service: Urology;  Laterality: N/A;  . EXTRACORPOREAL SHOCK WAVE LITHOTRIPSY  2009  . INTRAUTERINE DEVICE INSERTION  MARCH 2012   MURINA  . URETERAL STENT PLACEMENT  1998  (APPROX)   Kidney Stone  . URETEROSCOPY WITH HOLMIUM LASER LITHOTRIPSY Left 02/02/2017   Procedure: URETEROSCOPY WITH HOLMIUM LASER LITHOTRIPSY;  Surgeon: Bjorn PippinWrenn, John, MD;  Location: [facility] ORS;  Service: Urology;  Laterality: Left;    Family History  Problem Relation Age of Onset  . Cancer Father        Lung  . Cancer Mother 2940       ?  breast age late 7640's  . Alcohol abuse Maternal Uncle   . Alcohol abuse Paternal Uncle   . Cancer Maternal Grandmother        Ovarian  . Cancer Maternal Grandfather        Lymphoma  . Cancer Maternal Aunt 1670       great aunt breast late 3170's  . Alcohol abuse Paternal Grandfather   . Breast cancer Neg Hx     Social History   Socioeconomic History  . Marital status: Married    Spouse name: Not on file  . Number of children: 1  . Years of education: Not on file  . Highest education level: Not on file  Social Needs  . Financial resource strain: Not on file  . Food insecurity - worry: Not on file  . Food insecurity - inability: Not on file  . Transportation needs - medical: Not on file  . Transportation needs - non-medical: Not on file  Occupational History  . Occupation: Special ED Teacher    Comment: [employer]  Tobacco Use  . Smoking status: Current Every Day Smoker    Packs/day: 0.50    Years: 16.00    Pack years: 8.00    Types: Cigarettes    Last attempt to quit: 06/25/2014    Years since quitting: 3.0  . Smokeless tobacco: Never Used  Substance and Sexual Activity  . Alcohol use: Yes    Alcohol/week: 0.0 oz    Comment: RARE  . Drug use: No  . Sexual activity: Yes    Birth control/protection: IUD  Other Topics Concern  . Not on file  Social History Narrative   No regular exercise.      Nutrition: 3 meals a day, limited fruits and vegetables, fast meals occasionally      Cell: [phone]      Married   Technical brewer education   2 children    Caffeine- 1-2 cups of coke daily     Current Outpatient Medications:  .  acetaminophen (TYLENOL) 325 MG tablet, Take 650 mg by mouth every 6 (six) hours as needed for headache., Disp: , Rfl:  .  buPROPion (WELLBUTRIN XL) 300 MG 24 hr tablet, Take 1 tablet (300 mg total) by mouth daily., Disp: 90 tablet, Rfl: 3 .  ibuprofen (ADVIL,MOTRIN) 200 MG tablet, Take 600-800 mg by mouth every 6 (six)  hours as needed for headache., Disp: , Rfl:  .  PARoxetine (PAXIL) 10 MG tablet, Take 1 tablet (10 mg total) by mouth daily., Disp: 30 tablet, Rfl: 5 .  raloxifene (EVISTA) 60 MG tablet, Take 1 tablet (60 mg total) by mouth daily., Disp: 30 tablet, Rfl: 11 .  ciprofloxacin (CIPRO) 500 MG tablet, Take 1 tablet (500 mg total) by mouth 2 (two) times daily., Disp: 6 tablet, Rfl: 0 .  mirabegron ER (MYRBETRIQ) 25 MG TB24 tablet, Take 1 tablet (25 mg total) daily by mouth. (Patient not taking: Reported on 07/28/2017), Disp: 30 tablet, Rfl: 11 .  Potassium Citrate 15 MEQ (1620 MG) TBCR, Take 2 tablets 2 (two) times daily by mouth. (Patient not taking: Reported on 07/28/2017), Disp: 120 tablet, Rfl: 11   ROS:  Review of Systems  Constitutional: Negative for fever.  Gastrointestinal: Positive for constipation. Negative for blood in stool, diarrhea, nausea and vomiting.  Genitourinary: Positive for frequency and urgency. Negative for dyspareunia, dysuria, flank pain, hematuria, vaginal bleeding, vaginal discharge and vaginal pain.  Musculoskeletal: Positive for back pain.  Skin: Negative for rash.     OBJECTIVE:   Vitals:  BP (!) 142/98   Pulse 78   Temp 98.5 F (36.9 C)   Ht 5\' 2"  (1.575 m)   Wt 196 lb (88.9 kg)   BMI 35.85 kg/m   Physical Exam  Constitutional: She is oriented to person, place, and time and well-developed, well-nourished, and in no distress.  Abdominal: Soft. Normal appearance. There is tenderness in the right lower quadrant, periumbilical area, suprapubic area and left lower quadrant. There is no CVA tenderness.  Neurological: She is alert and oriented to person, place, and time.  Psychiatric: Affect and judgment normal.  Vitals reviewed.   Results: Results for orders placed or performed in visit on 07/28/17 (from the past 24 hour(s))  POCT Urinalysis Dipstick     Status: Abnormal   Collection Time: 07/28/17  5:02 PM  Result Value Ref Range   Color, UA dark     Clarity, UA cloudy    Glucose, UA neg    Bilirubin, UA neg    Ketones, UA neg    Spec Grav, UA 1.015 1.010 - 1.025   Blood, UA large    pH, UA 6.0 5.0 - 8.0   Protein, UA neg    Urobilinogen, UA  0.2 or 1.0 E.U./dL   Nitrite, UA neg    Leukocytes, UA Moderate (2+) (  A) Negative     Assessment/Plan: Acute cystitis with hematuria - Rx cipro. Allergic to macrobid. Check C&S. F/u prn. - Plan: POCT Urinalysis Dipstick, Urine Culture, ciprofloxacin (CIPRO) 500 MG tablet  Periumbilical abdominal pain - Question UTI vs early norovirus sx (been going around pt's household).  Elevated blood-pressure reading without diagnosis of hypertension - Pt with white coat HTN and doesn't feel well/in pain. Will rechk at 2/19 annual. WNL with PCP 10/18.    Meds ordered this encounter  Medications  . ciprofloxacin (CIPRO) 500 MG tablet    Sig: Take 1 tablet (500 mg total) by mouth 2 (two) times daily.    Dispense:  6 tablet    Refill:  0      Return if symptoms worsen or fail to improve.  Alicia B. Copland, PA-C 07/28/2017 5:07 PM

## 2017-07-31 LAB — URINE CULTURE

## 2017-08-04 ENCOUNTER — Other Ambulatory Visit: Payer: Self-pay | Admitting: Obstetrics and Gynecology

## 2017-08-04 ENCOUNTER — Telehealth: Payer: Self-pay

## 2017-08-04 MED ORDER — PHENAZOPYRIDINE HCL 200 MG PO TABS: 200.0000 mg | ORAL_TABLET | Freq: Three times a day (TID) | ORAL | 0 refills | Status: DC | PRN

## 2017-08-04 NOTE — Telephone Encounter (Signed)
Could be bladder spasm. Will send in Rx pyridium. Can take TID. F/u with PCP if no sx change with pyridium. Will turn urine orange.

## 2017-08-04 NOTE — Telephone Encounter (Signed)
Urine culture did not show any signs of UTI. What are pt's sx now? If not GYN related, pt needs to f/u with PCP.

## 2017-08-04 NOTE — Progress Notes (Signed)
Bladder pressure and hesitancy. Question spasm. Neg C&S. F/u if sx persist.

## 2017-08-04 NOTE — Telephone Encounter (Signed)
Pt finished medication and is still feeling uncomfortable.  Pt calling for results and possibly a diff rx or something.  [phone]

## 2017-08-04 NOTE — Telephone Encounter (Signed)
Still pressure but cant really urinate, sometimes its just a drop that will come out.  pain is a little better than when she first came in. Please advise if she should go to pcp

## 2017-08-04 NOTE — Telephone Encounter (Signed)
Please advise 

## 2017-08-04 NOTE — Telephone Encounter (Signed)
lmtrc

## 2017-08-13 ENCOUNTER — Ambulatory Visit (INDEPENDENT_AMBULATORY_CARE_PROVIDER_SITE_OTHER): Payer: BC Managed Care – PPO | Admitting: Urology

## 2017-08-13 ENCOUNTER — Encounter: Payer: Self-pay | Admitting: Urology

## 2017-08-13 VITALS — BP 146/86 | HR 112 | Ht 62.0 in | Wt 187.4 lb

## 2017-08-13 DIAGNOSIS — R310 Gross hematuria: Secondary | ICD-10-CM

## 2017-08-13 DIAGNOSIS — N200 Calculus of kidney: Secondary | ICD-10-CM | POA: Diagnosis not present

## 2017-08-13 DIAGNOSIS — R350 Frequency of micturition: Secondary | ICD-10-CM

## 2017-08-13 LAB — URINALYSIS, COMPLETE
Bilirubin, UA: NEGATIVE
GLUCOSE, UA: NEGATIVE
Ketones, UA: NEGATIVE
NITRITE UA: NEGATIVE
PH UA: 6 (ref 5.0–7.5)
PROTEIN UA: NEGATIVE
Specific Gravity, UA: 1.025 (ref 1.005–1.030)
Urobilinogen, Ur: 0.2 mg/dL (ref 0.2–1.0)

## 2017-08-13 LAB — MICROSCOPIC EXAMINATION

## 2017-08-13 LAB — BLADDER SCAN AMB NON-IMAGING: Scan Result: 24

## 2017-08-13 NOTE — Progress Notes (Signed)
08/13/2017 9:10 AM   Ariana Weeks 06/01/1971 [id number]  Referring provider: Sherlene Shams, MD [address] [place], [place] [address]  Chief Complaint  Patient presents with  . Nephrolithiasis    HPI: Patient is a 46 year old Caucasian female who presents today as a referral from Dr. Darrick Huntsman for bladder pain.  She has bilateral stones and has undergone multiple URS and ESWL procedures.  She has had her stones analyzed and had a 24 hour urine performed.  It demonstrated stone composition of 80% calcium oxalate monohydrate, 5% calcium oxalate dihydrate, 15% calcium phosphate. Asnoted above, she has significant recurrent nephrolithiasis.  She underwent a 24-hour urinalysis.  He is positive for low urine volume at 1.7 L.  Her urine pH was 5.756.  She had borderline hypocitraturia the urinary citrate of 479.  She also had borderline elevated urinary calcium at 232.  She was also started on Myrbetriq at her visit on 07/10/2017 with Dr. Sherryl Barters.  She stopped that medication while she was experiencing these bladder symptoms.    Today, she states three weeks ago she had excruciating pain with urination.  She was also having frequency, urgency and LBP.  She did have an episode of gross hematuria.   She denies fevers, chills or vomiting.  She is having nausea.   She went to [employer], [facility] and urine dip was positive for 2+ leukocytes.  She was started on Cipro.  Her urine culture was positive for MUF.  The Cipro did not provide any relief.    She is still experiencing frequency, dysuria, nocturia and straining to urinate.  She denies flank pain.  She is still having LBP.  Her UA today is positive for 6-10 WBC's and 3-10 RBC's.  Her PVR is 24 mL.      Reviewed referral notes.    PMH: Past Medical History:  Diagnosis Date  . Depression   . Frequent headaches   . Head cold   . Headache(784.0)   . History of frequent urinary tract infections   . History of kidney stones     . Kidney stones   . Left ureteral calculus     Surgical History: Past Surgical History:  Procedure Laterality Date  . BREAST BIOPSY Right 02/02/2013   [place] bx/clip-neg  . BREAST BIOPSY Right 02/19/2017   affirm, ATYPICAL LOBULAR HYPERPLASIA  . BREAST BIOPSY Right 03/04/2017   Procedure: BREAST BIOPSY WITH NEEDLE LOCALIZATION;  Surgeon: Earline Mayotte, MD;  Location: [facility];  Service: General;  Laterality: Right;  . BREAST LUMPECTOMY    . CESAREAN SECTION  2005  . CHOLECYSTECTOMY  09/2012  . CYSTOSCOPY W/ RETROGRADES N/A 02/02/2017   Procedure: CYSTOSCOPY WITH RETROGRADE PYELOGRAM;  Surgeon: Bjorn Pippin, MD;  Location: [facility];  Service: Urology;  Laterality: N/A;  . EXTRACORPOREAL SHOCK WAVE LITHOTRIPSY  2009  . INTRAUTERINE DEVICE INSERTION  MARCH 2012   MURINA  . URETERAL STENT PLACEMENT  1998  (APPROX)   Kidney Stone  . URETEROSCOPY WITH HOLMIUM LASER LITHOTRIPSY Left 02/02/2017   Procedure: URETEROSCOPY WITH HOLMIUM LASER LITHOTRIPSY;  Surgeon: Bjorn Pippin, MD;  Location: [facility];  Service: Urology;  Laterality: Left;    Home Medications:  Allergies as of 08/13/2017      Reactions   Dilaudid [hydromorphone Hcl] Itching   Red face and itching   Azithromycin    Other reaction(s): Unknown   Nitrofurantoin Hives   Macrobid   Morphine And Related Hives, Rash  Medication List        Accurate as of 08/13/17  9:10 AM. Always use your most recent med list.          acetaminophen 325 MG tablet Commonly known as:  TYLENOL Take 650 mg by mouth every 6 (six) hours as needed for headache.   buPROPion 300 MG 24 hr tablet Commonly known as:  WELLBUTRIN XL Take 1 tablet (300 mg total) by mouth daily.   ibuprofen 200 MG tablet Commonly known as:  ADVIL,MOTRIN Take 600-800 mg by mouth every 6 (six) hours as needed for headache.   mirabegron ER 25 MG Tb24 tablet Commonly known as:  MYRBETRIQ Take 1 tablet (25 mg total) daily by mouth.   PARoxetine 10 MG  tablet Commonly known as:  PAXIL Take 1 tablet (10 mg total) by mouth daily.   phenazopyridine 200 MG tablet Commonly known as:  PYRIDIUM Take 1 tablet (200 mg total) by mouth 3 (three) times daily as needed for pain (urethral spasm).   Potassium Citrate 15 MEQ (1620 MG) Tbcr Take 2 tablets 2 (two) times daily by mouth.   raloxifene 60 MG tablet Commonly known as:  EVISTA Take 1 tablet (60 mg total) by mouth daily.       Allergies:  Allergies  Allergen Reactions  . Dilaudid [Hydromorphone Hcl] Itching    Red face and itching  . Azithromycin     Other reaction(s): Unknown  . Nitrofurantoin Hives    Macrobid  . Morphine And Related Hives and Rash    Family History: Family History  Problem Relation Age of Onset  . Cancer Father        Lung  . Cancer Mother 140       ? breast age late 4640's  . Alcohol abuse Maternal Uncle   . Alcohol abuse Paternal Uncle   . Cancer Maternal Grandmother        Ovarian  . Cancer Maternal Grandfather        Lymphoma  . Cancer Maternal Aunt 6470       great aunt breast late 4470's  . Alcohol abuse Paternal Grandfather   . Breast cancer Neg Hx     Social History:  reports that she has been smoking cigarettes.  She has a 8.00 pack-year smoking history. she has never used smokeless tobacco. She reports that she drinks alcohol. She reports that she does not use drugs.  ROS: UROLOGY Frequent Urination?: Yes Hard to postpone urination?: No Burning/pain with urination?: Yes Get up at night to urinate?: Yes Leakage of urine?: No Urine stream starts and stops?: No Trouble starting stream?: No Do you have to strain to urinate?: Yes Blood in urine?: No Urinary tract infection?: No Sexually transmitted disease?: No Injury to kidneys or bladder?: No Painful intercourse?: No Weak stream?: No Currently pregnant?: No Vaginal bleeding?: No Last menstrual period?: n  Gastrointestinal Nausea?: No Vomiting?: No Indigestion/heartburn?:  No Diarrhea?: No Constipation?: No  Constitutional Fever: No Night sweats?: No Weight loss?: No Fatigue?: No  Skin Skin rash/lesions?: No Itching?: No  Eyes Blurred vision?: No Double vision?: No  Ears/Nose/Throat Sore throat?: No Sinus problems?: No  Hematologic/Lymphatic Swollen glands?: No Easy bruising?: No  Cardiovascular Leg swelling?: No Chest pain?: No  Respiratory Cough?: No Shortness of breath?: No  Endocrine Excessive thirst?: No  Musculoskeletal Back pain?: No Joint pain?: No  Neurological Headaches?: No Dizziness?: No  Psychologic Depression?: Yes Anxiety?: Yes  Physical Exam: BP (!) 146/86 (BP Location: Right Arm,  Patient Position: Sitting, Cuff Size: Large)   Pulse (!) 112   Ht 5\' 2"  (1.575 m)   Wt 187 lb 6.4 oz (85 kg)   BMI 34.28 kg/m   Constitutional: Well nourished. Alert and oriented, No acute distress. HEENT: [place] AT, moist mucus membranes. Trachea midline, no masses. Cardiovascular: No clubbing, cyanosis, or edema. Respiratory: Normal respiratory effort, no increased work of breathing. GI: Abdomen is soft, non tender, non distended, no abdominal masses. Liver and spleen not palpable.  No hernias appreciated.  Stool sample for occult testing is not indicated.   GU: No CVA tenderness.  No bladder fullness or masses.   Skin: No rashes, bruises or suspicious lesions. Lymph: No cervical or inguinal adenopathy. Neurologic: Grossly intact, no focal deficits, moving all 4 extremities. Psychiatric: Normal mood and affect.  Laboratory Data: Lab Results  Component Value Date   WBC 7.2 06/23/2017   HGB 14.1 06/23/2017   HCT 42.5 06/23/2017   MCV 91.6 06/23/2017   PLT 238.0 06/23/2017    Lab Results  Component Value Date   CREATININE 0.81 03/15/2017     Lab Results  Component Value Date   HGBA1C 5.4 06/14/2015    Lab Results  Component Value Date   TSH 2.46 06/23/2017     Lab Results  Component Value Date   AST  18 02/02/2017   Lab Results  Component Value Date   ALT 14 02/02/2017    Urinalysis 3-10 RBC's.  6-10 WBC's.  See Epic.    I have reviewed the labs.  Pertinent Imaging: Results for Ariana LundROSE, Joliana M (MRN [id number]) as of 08/13/2017 09:09  Ref. Range 08/13/2017 09:08  Scan Result Unknown 24     Assessment & Plan:    1. Bilateral nephrolithiasis  - Urinalysis, Complete - positive for hematuria - negative ucx last week  - urine sent for culture - advised the patient to call the office after the Christmas Holidays for results  - will obtain CT Renal stone protocol at this time - her insurance will not cover the imaging studies as it was deemed medically unnecessary   - Advised to contact our office or seek treatment in the ED if becomes febrile or pain/ vomiting are difficult control in order to arrange for emergent/urgent intervention  2. Gross hematuria  - underwent cystoscopy and left retrogrades with ureteroscopy in 01/2017 - no worrisome lesions seen  - repeat CT studies denied by insurance company    Return for pending urine culture.  These notes generated with voice recognition software. I apologize for typographical errors.  Michiel CowboySHANNON Mckala Pantaleon, PA-C  [facility] [address], [address] [place], [place] [address] [phone]

## 2017-08-15 LAB — URINE CULTURE

## 2017-08-19 ENCOUNTER — Other Ambulatory Visit: Payer: Self-pay

## 2017-08-19 ENCOUNTER — Encounter: Payer: Self-pay | Admitting: Family Medicine

## 2017-08-19 ENCOUNTER — Ambulatory Visit: Payer: BC Managed Care – PPO | Admitting: Family Medicine

## 2017-08-19 ENCOUNTER — Telehealth: Payer: Self-pay

## 2017-08-19 VITALS — BP 140/86 | HR 116 | Temp 98.8°F | Ht 63.0 in | Wt 192.0 lb

## 2017-08-19 DIAGNOSIS — Z87891 Personal history of nicotine dependence: Secondary | ICD-10-CM | POA: Diagnosis not present

## 2017-08-19 DIAGNOSIS — J209 Acute bronchitis, unspecified: Secondary | ICD-10-CM | POA: Diagnosis not present

## 2017-08-19 MED ORDER — HYDROCODONE-HOMATROPINE 5-1.5 MG/5ML PO SYRP: ORAL_SOLUTION | ORAL | 0 refills | Status: DC

## 2017-08-19 MED ORDER — DOXYCYCLINE HYCLATE 100 MG PO TABS: 100.0000 mg | ORAL_TABLET | Freq: Two times a day (BID) | ORAL | 0 refills | Status: AC

## 2017-08-19 NOTE — Progress Notes (Signed)
Dr. Karleen Hampshire T. Valari Taylor, MD, CAQ Sports Medicine [facility] [address] [place], [address] Phone: [phone] Fax: [phone]  08/19/2017  Patient: Ariana Weeks, MRN: [id number], DOB: [birth year], 46 y.o.  Primary Physician:  Sherlene Shams, MD   Chief Complaint  Patient presents with  . Nasal Congestion    started Friday  . Cough  . Ear Pain  . Headache   Subjective:   Ariana Weeks is a 46 y.o. very pleasant female patient who presents with the following:  6 days of feeling poorly  Friday started and throat was hurting. Took some tylenol and today is the worst and blowing out green. Special ed K-2nd.  Pleasant lady, she is having significant nasal congestion, sinus pain, earache, as well as headache.  She also is having significant cough that is productive of yellowish green sputum.  This is been ongoing now for about 6 days.  She has been taking some over-the-counter Mucinex without any significant relief.  She stayed up last night until approximately 5 AM coughing all night.  Not smoking now.    Past Medical History, Surgical History, Social History, Family History, Problem List, Medications, and Allergies have been reviewed and updated if relevant.  Patient Active Problem List   Diagnosis Date Noted  . Chronic pain of right ankle 06/25/2017  . Lobular carcinoma in situ (LCIS) of right breast 03/17/2017  . Hydronephrosis of left kidney 02/02/2017  . Anxiety and depression 07/17/2015    Past Medical History:  Diagnosis Date  . Depression   . Frequent headaches   . Head cold   . Headache(784.0)   . History of frequent urinary tract infections   . History of kidney stones   . Kidney stones   . Left ureteral calculus     Past Surgical History:  Procedure Laterality Date  . BREAST BIOPSY Right 02/02/2013   [place] bx/clip-neg  . BREAST BIOPSY Right 02/19/2017   affirm, ATYPICAL LOBULAR HYPERPLASIA  . BREAST BIOPSY Right 03/04/2017     Procedure: BREAST BIOPSY WITH NEEDLE LOCALIZATION;  Surgeon: Earline Mayotte, MD;  Location: [facility];  Service: General;  Laterality: Right;  . BREAST LUMPECTOMY    . CESAREAN SECTION  2005  . CHOLECYSTECTOMY  09/2012  . CYSTOSCOPY W/ RETROGRADES N/A 02/02/2017   Procedure: CYSTOSCOPY WITH RETROGRADE PYELOGRAM;  Surgeon: Bjorn Pippin, MD;  Location: [facility];  Service: Urology;  Laterality: N/A;  . EXTRACORPOREAL SHOCK WAVE LITHOTRIPSY  2009  . INTRAUTERINE DEVICE INSERTION  MARCH 2012   MURINA  . URETERAL STENT PLACEMENT  1998  (APPROX)   Kidney Stone  . URETEROSCOPY WITH HOLMIUM LASER LITHOTRIPSY Left 02/02/2017   Procedure: URETEROSCOPY WITH HOLMIUM LASER LITHOTRIPSY;  Surgeon: Bjorn Pippin, MD;  Location: [facility];  Service: Urology;  Laterality: Left;    Social History   Socioeconomic History  . Marital status: Married    Spouse name: Not on file  . Number of children: 1  . Years of education: Not on file  . Highest education level: Not on file  Social Needs  . Financial resource strain: Not on file  . Food insecurity - worry: Not on file  . Food insecurity - inability: Not on file  . Transportation needs - medical: Not on file  . Transportation needs - non-medical: Not on file  Occupational History  . Occupation: Special ED Teacher    Comment: [employer]  Tobacco Use  . Smoking status:  Former Smoker    Packs/day: 0.50    Years: 16.00    Pack years: 8.00    Types: Cigarettes    Last attempt to quit: 06/25/2014    Years since quitting: 3.1  . Smokeless tobacco: Never Used  Substance and Sexual Activity  . Alcohol use: Yes    Alcohol/week: 0.0 oz    Comment: RARE  . Drug use: No  . Sexual activity: Yes    Birth control/protection: IUD  Other Topics Concern  . Not on file  Social History Narrative   No regular exercise.      Nutrition: 3 meals a day, limited fruits and vegetables, fast meals occasionally      Cell: [phone]      Married    Technical brewermployed Teacher   College education   2 children    Caffeine- 1-2 cups of coke daily    Family History  Problem Relation Age of Onset  . Cancer Father        Lung  . Cancer Mother 6440       ? breast age late 6840's  . Alcohol abuse Maternal Uncle   . Alcohol abuse Paternal Uncle   . Cancer Maternal Grandmother        Ovarian  . Cancer Maternal Grandfather        Lymphoma  . Cancer Maternal Aunt 4770       great aunt breast late 6570's  . Alcohol abuse Paternal Grandfather   . Breast cancer Neg Hx     Allergies  Allergen Reactions  . Dilaudid [Hydromorphone Hcl] Itching    Red face and itching  . Azithromycin     Other reaction(s): Unknown  . Nitrofurantoin Hives    Macrobid  . Morphine And Related Hives and Rash    Medication list reviewed and updated in full in [facility].  ROS: GEN: Acute illness details above GI: Tolerating PO intake GU: maintaining adequate hydration and urination Pulm: No SOB Interactive and getting along well at home.  Otherwise, ROS is as per the HPI.  Objective:   BP 140/86   Pulse (!) 116   Temp 98.8 F (37.1 C) (Oral)   Ht 5\' 3"  (1.6 m)   Wt 192 lb (87.1 kg)   SpO2 96%   BMI 34.01 kg/m    Gen: WDWN, NAD; alert,appropriate and cooperative throughout exam    HEENT: Normocephalic and atraumatic. Throat clear, w/o exudate, no LAD, R TM clear, L TM - good landmarks, No fluid present. rhinnorhea.  Left frontal and maxillary sinuses: Tender Right frontal and maxillary sinuses: Tender    Neck: No ant or post LAD  CV: RRR, No M/G/R  Pulm: Breathing comfortably in no resp distress. no w/c/ with scattered rhonchi  Abd: S,NT,ND,+BS Extr: no c/c/e Psych: full affect, pleasant    Laboratory and Imaging Data:  Assessment and Plan:   Acute bronchitis, unspecified organism  Former smoker  Supportive care, cough meds + doxy in this case  Follow-up: No Follow-up on file.  Future Appointments  Date Time Provider Department  Center  10/20/2017  4:10 PM Lakoda Raske, Ilona Sorrellicia B, PA-C [facility] None    Meds ordered this encounter  Medications  . HYDROcodone-homatropine (HYCODAN) 5-1.5 MG/5ML syrup    Sig: 1 tsp po at night before bed prn cough    Dispense:  120 mL    Refill:  0  . doxycycline (VIBRA-TABS) 100 MG tablet    Sig: Take 1 tablet (100 mg total)  by mouth 2 (two) times daily for 10 days.    Dispense:  20 tablet    Refill:  0   Medications Discontinued During This Encounter  Medication Reason  . mirabegron ER (MYRBETRIQ) 25 MG TB24 tablet Completed Course  . phenazopyridine (PYRIDIUM) 200 MG tablet Completed Course  . Potassium Citrate 15 MEQ (1620 MG) TBCR Completed Course   No orders of the defined types were placed in this encounter.   Signed,  Elpidio GaleaSpencer T. Garrison Michie, MD   Allergies as of 08/19/2017      Reactions   Dilaudid [hydromorphone Hcl] Itching   Red face and itching   Azithromycin    Other reaction(s): Unknown   Nitrofurantoin Hives   Macrobid   Morphine And Related Hives, Rash      Medication List        Accurate as of 08/19/17 10:39 AM. Always use your most recent med list.          acetaminophen 325 MG tablet Commonly known as:  TYLENOL Take 650 mg by mouth every 6 (six) hours as needed for headache.   buPROPion 300 MG 24 hr tablet Commonly known as:  WELLBUTRIN XL Take 1 tablet (300 mg total) by mouth daily.   doxycycline 100 MG tablet Commonly known as:  VIBRA-TABS Take 1 tablet (100 mg total) by mouth 2 (two) times daily for 10 days.   HYDROcodone-homatropine 5-1.5 MG/5ML syrup Commonly known as:  HYCODAN 1 tsp po at night before bed prn cough   ibuprofen 200 MG tablet Commonly known as:  ADVIL,MOTRIN Take 600-800 mg by mouth every 6 (six) hours as needed for headache.   PARoxetine 10 MG tablet Commonly known as:  PAXIL Take 1 tablet (10 mg total) by mouth daily.   raloxifene 60 MG tablet Commonly known as:  EVISTA Take 1 tablet (60 mg total) by mouth  daily.

## 2017-08-19 NOTE — Telephone Encounter (Signed)
Left patient mess to call 

## 2017-08-19 NOTE — Telephone Encounter (Signed)
-----   Message from Harle BattiestShannon A McGowan, PA-C sent at 08/16/2017  4:22 PM EST ----- Please let Nicholaus BloomKelley know that her urine culture was negative.  If she is still having pain, I suggest she go to the ED as her insurance would not cover a CT for us.

## 2017-08-26 NOTE — Telephone Encounter (Signed)
Pt called and LM on triage line over the weekend that she is still having severe pain. Attempted to return pt call without success. LMOM on pt cell number.

## 2017-10-18 ENCOUNTER — Encounter: Payer: Self-pay | Admitting: Urology

## 2017-10-20 ENCOUNTER — Encounter: Payer: Self-pay | Admitting: Obstetrics and Gynecology

## 2017-10-20 ENCOUNTER — Ambulatory Visit (INDEPENDENT_AMBULATORY_CARE_PROVIDER_SITE_OTHER): Payer: BC Managed Care – PPO | Admitting: Obstetrics and Gynecology

## 2017-10-20 VITALS — BP 118/74 | Ht 62.0 in | Wt 201.0 lb

## 2017-10-20 DIAGNOSIS — Z1231 Encounter for screening mammogram for malignant neoplasm of breast: Secondary | ICD-10-CM

## 2017-10-20 DIAGNOSIS — Z30431 Encounter for routine checking of intrauterine contraceptive device: Secondary | ICD-10-CM

## 2017-10-20 DIAGNOSIS — D0501 Lobular carcinoma in situ of right breast: Secondary | ICD-10-CM | POA: Diagnosis not present

## 2017-10-20 DIAGNOSIS — Z1239 Encounter for other screening for malignant neoplasm of breast: Secondary | ICD-10-CM

## 2017-10-20 DIAGNOSIS — Z8041 Family history of malignant neoplasm of ovary: Secondary | ICD-10-CM

## 2017-10-20 DIAGNOSIS — Z01419 Encounter for gynecological examination (general) (routine) without abnormal findings: Secondary | ICD-10-CM

## 2017-10-20 NOTE — Patient Instructions (Signed)
I value your feedback and entrusting us with your care. If you get a [facility] patient survey, I would appreciate you taking the time to let us know about your experience today. Thank you! 

## 2017-10-20 NOTE — Progress Notes (Signed)
PCP:  Sherlene Shamsullo, Teresa L, MD   Chief Complaint  Patient presents with  . Annual Exam     HPI:      [name] is a 47 y.o. [id number] who LMP was No LMP recorded. Patient is not currently having periods (Reason: IUD)., presents today for her annual examination.  Her menses are absent with IUD.  Dysmenorrhea none. She does have occas light, intermenstrual bleeding.  Sex activity: single partner, contraception - IUD. Mirena placed 09/25/14 Last Pap: 08/20/15  Results were: no abnormalities /neg HPV DNA  Hx of STDs: none  Last mammogram: February 09, 2017  Results were:  Cat 4 suspicious; bx with Dr. Lemar LivingsByrnett showed atypical lobular hyperplasia and lobular CIS. Pt doing evista for 5 yrs (couldn't take tamoxifen with her mood meds).  There is no FH of breast cancer. There is a FH of ovarian cancer in her MGM, genetic testing declined by pt last yr. The patient does not do self-breast exams.  Tobacco use: The patient denies current or previous tobacco use. Alcohol use: none No drug use.  Exercise: moderately active  She does not get adequate calcium and Vitamin D in her diet.  Labs with PCP.  Urinary sx from 12/18 resolved.   Past Medical History:  Diagnosis Date  . Depression   . Frequent headaches   . Head cold   . Headache(784.0)   . History of frequent urinary tract infections   . History of kidney stones   . Kidney stones   . Left ureteral calculus   . 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 Lemar LivingsByrnett    Past Surgical History:  Procedure Laterality Date  . BREAST BIOPSY Right 02/02/2013   [place] bx/clip-neg  . BREAST BIOPSY Right 02/19/2017   affirm, ATYPICAL LOBULAR HYPERPLASIA  . BREAST BIOPSY Right 03/04/2017   Procedure: BREAST BIOPSY WITH NEEDLE LOCALIZATION;  Surgeon: Earline MayotteByrnett, Jeffrey W, MD;  Location: [facility];  Service: General;  Laterality: Right;  . BREAST LUMPECTOMY    . CESAREAN SECTION  2005  . CHOLECYSTECTOMY  09/2012  . CYSTOSCOPY W/ RETROGRADES N/A  02/02/2017   Procedure: CYSTOSCOPY WITH RETROGRADE PYELOGRAM;  Surgeon: Bjorn PippinWrenn, John, MD;  Location: [facility];  Service: Urology;  Laterality: N/A;  . EXTRACORPOREAL SHOCK WAVE LITHOTRIPSY  2009  . INTRAUTERINE DEVICE INSERTION  MARCH 2012   MURINA  . URETERAL STENT PLACEMENT  1998  (APPROX)   Kidney Stone  . URETEROSCOPY WITH HOLMIUM LASER LITHOTRIPSY Left 02/02/2017   Procedure: URETEROSCOPY WITH HOLMIUM LASER LITHOTRIPSY;  Surgeon: Bjorn PippinWrenn, John, MD;  Location: [facility];  Service: Urology;  Laterality: Left;    Family History  Problem Relation Age of Onset  . Cancer Father        Lung  . Other Mother        breast cyst  . Alcohol abuse Maternal Uncle   . Alcohol abuse Paternal Uncle   . Ovarian cancer Maternal Grandmother [id number]  . Cancer Maternal Grandfather        Lymphoma  . Cancer Maternal Aunt 7570       great aunt breast late 5270's  . Alcohol abuse Paternal Grandfather   . Breast cancer Neg Hx     Social History   Socioeconomic History  . Marital status: Married    Spouse name: Not on file  . Number of children: 1  . Years of education: Not on file  . Highest education level: Not on file  Social  Needs  . Financial resource strain: Not on file  . Food insecurity - worry: Not on file  . Food insecurity - inability: Not on file  . Transportation needs - medical: Not on file  . Transportation needs - non-medical: Not on file  Occupational History  . Occupation: Special ED Teacher    Comment: [employer]  Tobacco Use  . Smoking status: Former Smoker    Packs/day: 0.50    Years: 16.00    Pack years: 8.00    Types: Cigarettes    Last attempt to quit: 06/25/2014    Years since quitting: 3.3  . Smokeless tobacco: Never Used  Substance and Sexual Activity  . Alcohol use: Yes    Alcohol/week: 0.0 oz    Comment: RARE  . Drug use: No  . Sexual activity: Yes    Birth control/protection: IUD  Other Topics Concern  . Not on file  Social History Narrative   No  regular exercise.      Nutrition: 3 meals a day, limited fruits and vegetables, fast meals occasionally      Cell: [phone]      Married   Technical brewer education   2 children    Caffeine- 1-2 cups of coke daily    Current Meds  Medication Sig  . buPROPion (WELLBUTRIN XL) 300 MG 24 hr tablet Take 1 tablet (300 mg total) by mouth daily.  Marland Kitchen PARoxetine (PAXIL) 10 MG tablet Take 1 tablet (10 mg total) by mouth daily.  . raloxifene (EVISTA) 60 MG tablet Take 1 tablet (60 mg total) by mouth daily.     ROS:  Review of Systems  Constitutional: Negative for fatigue, fever and unexpected weight change.  Respiratory: Negative for cough, shortness of breath and wheezing.   Cardiovascular: Negative for chest pain, palpitations and leg swelling.  Gastrointestinal: Negative for blood in stool, constipation, diarrhea, nausea and vomiting.  Endocrine: Negative for cold intolerance, heat intolerance and polyuria.  Genitourinary: Negative for dyspareunia, dysuria, flank pain, frequency, genital sores, hematuria, menstrual problem, pelvic pain, urgency, vaginal bleeding, vaginal discharge and vaginal pain.  Musculoskeletal: Negative for back pain, joint swelling and myalgias.  Skin: Negative for rash.  Neurological: Negative for dizziness, syncope, light-headedness, numbness and headaches.  Hematological: Negative for adenopathy.  Psychiatric/Behavioral: Positive for dysphoric mood. Negative for agitation, confusion, sleep disturbance and suicidal ideas. The patient is not nervous/anxious.      Objective: BP 118/74   Ht 5\' 2"  (1.575 m)   Wt 201 lb (91.2 kg)   BMI 36.76 kg/m    Physical Exam  Constitutional: She is oriented to person, place, and time. She appears well-developed and well-nourished.  Genitourinary: Vagina normal and uterus normal. There is no rash or tenderness on the right labia. There is no rash or tenderness on the left labia. No erythema or tenderness in the  vagina. No vaginal discharge found. Right adnexum does not display mass and does not display tenderness. Left adnexum does not display mass and does not display tenderness.  Cervix exhibits visible IUD strings. Cervix does not exhibit motion tenderness or polyp. Uterus is not enlarged or tender.  Neck: Normal range of motion. No thyromegaly present.  Cardiovascular: Normal rate, regular rhythm and normal heart sounds.  No murmur heard. Pulmonary/Chest: Effort normal and breath sounds normal. Right breast exhibits no mass, no nipple discharge, no skin change and no tenderness. Left breast exhibits no mass, no nipple discharge, no skin change and no tenderness.  Abdominal: Soft. There is no tenderness. There is no guarding.  Musculoskeletal: Normal range of motion.  Neurological: She is alert and oriented to person, place, and time. No cranial nerve deficit.  Psychiatric: She has a normal mood and affect. Her behavior is normal.  Vitals reviewed.   Assessment/Plan: Encounter for annual routine gynecological examination  Screening for breast cancer - Pt followed by Dr. Sheliah Hatch  Encounter for routine checking of intrauterine contraceptive device (IUD) - IUD in place. Due for rem 2/21.  Lobular carcinoma in situ (LCIS) of right breast - On evista with Dr. Lemar Livings. My Risk testing recommended but pt declines.  Family history of ovarian cancer - MyRisk testing discussed and handout given. Pt declines. F/u if desires.  GYN counsel breast self exam, mammography screening, adequate intake of calcium and vitamin D, diet and exercise     F/U  Return in about 1 year (around 10/20/2018).  Mercadez Heitman B. Reyanna Baley, PA-C 10/20/2017 4:50 PM

## 2017-11-23 ENCOUNTER — Other Ambulatory Visit: Payer: Self-pay

## 2017-11-23 DIAGNOSIS — Z1231 Encounter for screening mammogram for malignant neoplasm of breast: Secondary | ICD-10-CM

## 2017-12-03 ENCOUNTER — Other Ambulatory Visit: Payer: Self-pay

## 2017-12-03 DIAGNOSIS — Z1231 Encounter for screening mammogram for malignant neoplasm of breast: Secondary | ICD-10-CM

## 2017-12-22 ENCOUNTER — Encounter: Payer: Self-pay | Admitting: Internal Medicine

## 2017-12-22 ENCOUNTER — Encounter: Payer: Self-pay | Admitting: *Deleted

## 2017-12-22 ENCOUNTER — Telehealth: Payer: Self-pay | Admitting: Internal Medicine

## 2017-12-22 ENCOUNTER — Ambulatory Visit: Payer: BC Managed Care – PPO | Admitting: Family Medicine

## 2017-12-22 MED ORDER — BUPROPION HCL ER (XL) 300 MG PO TB24: 300.0000 mg | ORAL_TABLET | Freq: Every day | ORAL | 0 refills | Status: DC

## 2017-12-22 NOTE — Telephone Encounter (Signed)
This encounter was created in error - please disregard.

## 2017-12-22 NOTE — Telephone Encounter (Signed)
Copied from CRM [id number]. Topic: Quick Communication - Rx Refill/Question >> Dec 22, 2017 10:28 AM Floria Raveling A wrote: Medication: *** Has the patient contacted their pharmacy?  (Agent: If no, request that the patient contact the pharmacy for the refill.) Preferred Pharmacy (with phone number or street name): *** Agent: Please be advised that RX refills may take up to 3 business days. We ask that you follow-up with your pharmacy.

## 2017-12-22 NOTE — Telephone Encounter (Signed)
Copied from CRM [id number]. Topic: Quick Communication - Rx Refill/Question >> Dec 22, 2017 10:28 AM Floria Raveling A wrote: Medication: buPROPion (WELLBUTRIN XL) 300 MG 24 hr tablet [[id number]] Has the patient contacted their pharmacy? No  (Agent: If no, request that the patient contact the pharmacy for the refill.) Preferred Pharmacy (with phone number or street name): [facility] on [address]  Agent: Please be advised that RX refills may take up to 3 business days. We ask that you follow-up with your pharmacy.

## 2018-01-22 ENCOUNTER — Other Ambulatory Visit: Payer: Self-pay | Admitting: Internal Medicine

## 2018-01-27 ENCOUNTER — Ambulatory Visit: Payer: BC Managed Care – PPO | Admitting: Internal Medicine

## 2018-02-04 ENCOUNTER — Ambulatory Visit
Admission: RE | Admit: 2018-02-04 | Discharge: 2018-02-04 | Disposition: A | Discharge status: Home / Self Care | Payer: BC Managed Care – PPO | Source: Ambulatory Visit | Attending: General Surgery | Admitting: General Surgery

## 2018-02-04 DIAGNOSIS — Z1231 Encounter for screening mammogram for malignant neoplasm of breast: Secondary | ICD-10-CM | POA: Diagnosis present

## 2018-02-08 ENCOUNTER — Other Ambulatory Visit: Payer: Self-pay | Admitting: General Surgery

## 2018-02-08 DIAGNOSIS — N6489 Other specified disorders of breast: Secondary | ICD-10-CM

## 2018-02-08 DIAGNOSIS — R928 Other abnormal and inconclusive findings on diagnostic imaging of breast: Secondary | ICD-10-CM

## 2018-02-09 ENCOUNTER — Ambulatory Visit: Payer: BC Managed Care – PPO | Admitting: Urology

## 2018-02-09 ENCOUNTER — Encounter: Payer: Self-pay | Admitting: General Surgery

## 2018-02-09 ENCOUNTER — Ambulatory Visit: Payer: BC Managed Care – PPO | Admitting: General Surgery

## 2018-02-09 VITALS — BP 128/82 | HR 82 | Resp 14 | Ht 62.0 in | Wt 208.0 lb

## 2018-02-09 DIAGNOSIS — D0501 Lobular carcinoma in situ of right breast: Secondary | ICD-10-CM | POA: Diagnosis not present

## 2018-02-09 NOTE — Patient Instructions (Signed)
Will have additional views done on 02/11/18. If normal will follow up in one year with bilateral screening mammogram and office visit

## 2018-02-09 NOTE — Progress Notes (Signed)
Patient ID: Ariana Weeks, female   DOB: Dec 31, 1970, 47 y.o.   MRN: [id number]  Chief Complaint  Patient presents with  . Follow-up    mammogram    HPI Ariana Weeks is a 47 y.o. female who presents for a breast evaluation. The most recent mammogram was done on 02/02/18.  Patient does perform regular self breast checks and gets regular mammograms done. She is due to have additional imaging of the left breast on 02/11/18. She is doing well on the Evista. She is here today with her husband, Ricki.    HPI  Past Medical History:  Diagnosis Date  . Depression   . Frequent headaches   . Head cold   . Headache(784.0)   . History of frequent urinary tract infections   . History of kidney stones   . Kidney stones   . Left ureteral calculus   . 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 Lemar Livings    Past Surgical History:  Procedure Laterality Date  . BREAST BIOPSY Right 02/02/2013   [place] bx/clip-neg  . BREAST BIOPSY Right 02/19/2017   affirm, ATYPICAL LOBULAR HYPERPLASIA  . BREAST BIOPSY Right 03/04/2017   Procedure: BREAST BIOPSY WITH NEEDLE LOCALIZATION;  Surgeon: Earline Mayotte, MD;  Location: [facility];  Service: General;  Laterality: Right;  . BREAST LUMPECTOMY Right 2018  . CESAREAN SECTION  2005  . CHOLECYSTECTOMY  09/2012  . CYSTOSCOPY W/ RETROGRADES N/A 02/02/2017   Procedure: CYSTOSCOPY WITH RETROGRADE PYELOGRAM;  Surgeon: Bjorn Pippin, MD;  Location: [facility];  Service: Urology;  Laterality: N/A;  . EXTRACORPOREAL SHOCK WAVE LITHOTRIPSY  2009  . INTRAUTERINE DEVICE INSERTION  MARCH 2012   MURINA  . URETERAL STENT PLACEMENT  1998  (APPROX)   Kidney Stone  . URETEROSCOPY WITH HOLMIUM LASER LITHOTRIPSY Left 02/02/2017   Procedure: URETEROSCOPY WITH HOLMIUM LASER LITHOTRIPSY;  Surgeon: Bjorn Pippin, MD;  Location: [facility];  Service: Urology;  Laterality: Left;    Family History  Problem Relation Age of Onset  . Cancer Father        Lung  . Other Mother        breast cyst   . Alcohol abuse Maternal Uncle   . Alcohol abuse Paternal Uncle   . Ovarian cancer Maternal Grandmother 87  . Cancer Maternal Grandfather        Lymphoma  . Cancer Maternal Aunt 39       great aunt breast late 58's  . Alcohol abuse Paternal Grandfather   . Breast cancer Neg Hx     Social History Social History   Tobacco Use  . Smoking status: Former Smoker    Packs/day: 0.50    Years: 16.00    Pack years: 8.00    Types: Cigarettes    Last attempt to quit: 06/25/2014    Years since quitting: 3.6  . Smokeless tobacco: Never Used  Substance Use Topics  . Alcohol use: Yes    Alcohol/week: 0.0 oz    Comment: RARE  . Drug use: No    Allergies  Allergen Reactions  . Dilaudid [Hydromorphone Hcl] Itching    Red face and itching  . Azithromycin     Other reaction(s): Unknown  . Nitrofurantoin Hives    Macrobid  . Morphine And Related Hives and Rash    Current Outpatient Medications  Medication Sig Dispense Refill  . buPROPion (WELLBUTRIN XL) 300 MG 24 hr tablet Take 1 tablet (300 mg total) by mouth daily. 45  tablet 0  . ibuprofen (ADVIL,MOTRIN) 200 MG tablet Take 600-800 mg by mouth every 6 (six) hours as needed for headache.    Marland Kitchen. PARoxetine (PAXIL) 10 MG tablet TAKE 1 TABLET(10 MG) BY MOUTH DAILY 30 tablet 0  . raloxifene (EVISTA) 60 MG tablet Take 1 tablet (60 mg total) by mouth daily. 30 tablet 11   No current facility-administered medications for this visit.     Review of Systems Review of Systems  Constitutional: Negative.   Respiratory: Negative.   Cardiovascular: Negative.     Blood pressure 128/82, pulse 82, resp. rate 14, height 5\' 2"  (1.575 m), weight 208 lb (94.3 kg).  Physical Exam Physical Exam  Constitutional: She is oriented to person, place, and time. She appears well-developed and well-nourished.  Eyes: Conjunctivae are normal. No scleral icterus.  Neck: Neck supple.  Cardiovascular: Normal rate, regular rhythm and normal heart sounds.   Pulmonary/Chest: Effort normal and breath sounds normal. Right breast exhibits no inverted nipple, no mass, no nipple discharge, no skin change and no tenderness. Left breast exhibits no inverted nipple, no mass, no nipple discharge, no skin change and no tenderness.    Lymphadenopathy:    She has no cervical adenopathy.    She has no axillary adenopathy.  Neurological: She is alert and oriented to person, place, and time.  Skin: Skin is warm and dry.  Psychiatric: She has a normal mood and affect.    Data Reviewed  March 04, 2017 right breast biopsy: DIAGNOSIS:  A. 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  - FOCAL FLAT EPITHELIAL ATYPIA.  - FIBROADENOMATOUS CHANGE.  - COLUMNAR CELL CHANGE.  - PSEUDO-ANGIOMATOUS STROMAL HYPERPLASIA.  - FIBROCYSTIC CHANGE.  - BIOPSY SITE CHANGES, MARKER CLIP PRESENT  Mammogram of February 04, 2018 reviewed.  Questionable density in the upper outer quadrant of the left breast.  BI-RADS-0.  Films reviewed with the patient.  Significant decrease in background breast parenchyma since initiation of Evista therapy.  Assessment    Stable right breast exam, good tolerance of antiestrogen therapy with Evista.  Questionable abnormality of the left breast.    Plan    Will have additional views done on 02/11/18. If normal will follow up in one year with bilateral screening mammogram and office visit.      HPI, Physical Exam, Assessment and Plan have been scribed under the direction and in the presence of Earline MayotteJeffrey W. Sherlynn Tourville, MD  Carron Brazenaryl-Lyn Kennedy, LPN  I have completed the exam and reviewed the above documentation for accuracy and completeness.  I agree with the above.  [employer] has been used and any errors in dictation or transcription are unintentional.  Donnalee CurryJeffrey Ann Groeneveld, M.D., F.A.C.S.  Merrily PewJeffrey W Clydie Dillen 02/09/2018, 8:55 PM

## 2018-02-11 ENCOUNTER — Ambulatory Visit
Admission: RE | Admit: 2018-02-11 | Discharge: 2018-02-11 | Disposition: A | Discharge status: Home / Self Care | Payer: BC Managed Care – PPO | Source: Ambulatory Visit | Attending: General Surgery | Admitting: General Surgery

## 2018-02-11 DIAGNOSIS — R928 Other abnormal and inconclusive findings on diagnostic imaging of breast: Secondary | ICD-10-CM | POA: Diagnosis present

## 2018-02-11 DIAGNOSIS — N6489 Other specified disorders of breast: Secondary | ICD-10-CM | POA: Diagnosis present

## 2018-02-16 ENCOUNTER — Other Ambulatory Visit: Payer: Self-pay | Admitting: Family Medicine

## 2018-02-17 NOTE — Telephone Encounter (Signed)
Refilled: 12/22/2017 Last OV: 06/23/2017 Next OV: not scheduled  No showed appt on 01/27/2018.

## 2018-02-18 ENCOUNTER — Telehealth: Payer: Self-pay

## 2018-02-18 NOTE — Telephone Encounter (Signed)
Copied from CRM [phone]. Topic: Appointment Scheduling - Scheduling Inquiry for Clinic >> Feb 17, 2018  3:31 PM Oneal GroutSebastian, Jennifer S wrote: Reason for CRM: Patient is requesting to be seen asap for lower back pain, ordered another location, declined. Please advise

## 2018-02-18 NOTE — Telephone Encounter (Signed)
I have never seen her,  And if she no showed her first appt with me  I will not reschedule her  But will fill her rx  for one month to give her time to find another doctor. Please draft letter

## 2018-02-19 NOTE — Telephone Encounter (Signed)
LMTCB. Please transfer pt to our office.  

## 2018-02-19 NOTE — Telephone Encounter (Signed)
See last phone encounter patient is calling back for refill

## 2018-02-19 NOTE — Telephone Encounter (Signed)
Patient calling to check on medication being sent to the pharmacy. Upon looking at past appointments, patient had an establish care visit with Dr Darrick Huntsmanullo on 06/23/17. Please advise. CB#: [phone]

## 2018-02-23 ENCOUNTER — Ambulatory Visit: Payer: BC Managed Care – PPO | Admitting: Internal Medicine

## 2018-02-23 ENCOUNTER — Other Ambulatory Visit: Payer: Self-pay

## 2018-02-23 VITALS — BP 140/98 | HR 94 | Temp 98.4°F | Resp 15 | Ht 62.0 in | Wt 210.8 lb

## 2018-02-23 DIAGNOSIS — E669 Obesity, unspecified: Secondary | ICD-10-CM | POA: Diagnosis not present

## 2018-02-23 DIAGNOSIS — M5431 Sciatica, right side: Secondary | ICD-10-CM | POA: Diagnosis not present

## 2018-02-23 DIAGNOSIS — M5387 Other specified dorsopathies, lumbosacral region: Secondary | ICD-10-CM

## 2018-02-23 MED ORDER — TRAMADOL HCL 50 MG PO TABS: 50.0000 mg | ORAL_TABLET | Freq: Three times a day (TID) | ORAL | 0 refills | Status: DC | PRN

## 2018-02-23 MED ORDER — GABAPENTIN 100 MG PO CAPS: 100.0000 mg | ORAL_CAPSULE | Freq: Three times a day (TID) | ORAL | 3 refills | Status: DC

## 2018-02-23 MED ORDER — PREDNISONE 10 MG PO TABS: ORAL_TABLET | ORAL | 0 refills | Status: DC

## 2018-02-23 MED ORDER — TIZANIDINE HCL 4 MG PO TABS: 4.0000 mg | ORAL_TABLET | Freq: Four times a day (QID) | ORAL | 0 refills | Status: DC | PRN

## 2018-02-23 MED ORDER — PHENTERMINE HCL 37.5 MG PO TABS: 37.5000 mg | ORAL_TABLET | Freq: Every day | ORAL | 2 refills | Status: DC

## 2018-02-23 MED ORDER — BUPROPION HCL ER (XL) 300 MG PO TB24: 300.0000 mg | ORAL_TABLET | Freq: Every day | ORAL | 2 refills | Status: DC

## 2018-02-23 NOTE — Telephone Encounter (Signed)
Pt has been scheduled for this afternoon at 5pm.

## 2018-02-23 NOTE — Patient Instructions (Addendum)
Prednisone taper for 6  Days   Take 1000 mg tylenol every 12 hours.  Can combine with tramadol every 6 hours.  50 mg   Gabapentin  100 mg every 8 hours if tolerated .  You Can use 200 to 300 mg at night if it  Helps\  No motrin or aleve. While you are on the prednisone     Tizanadine is a Muscle relaxer and can be taken every 8 hours or just in the evening if too sedating     I have authorized the use of phentermine for 3 months.  Please have your vital signs checked a week after starting, and return to see me in 3 months  Take 1/2 tablet in the morning,  The second half by 2 PM to avoid insomnia.  Or take the whole tablet in the morning,  Goal weight loss is  5% of your starting weight by the end of the  3 months , which For you ,  Based on today's reading,  Is 11 lbs

## 2018-02-23 NOTE — Progress Notes (Signed)
Subjective:  Patient ID: Ariana Weeks, female    DOB: [birth year]  Age: 47 y.o. MRN: [id number]  CC: The primary encounter diagnosis was Sciatica of right side. 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  HPI Ariana Weeks presents for eval and management of back pain . Has been present since beginning of  June  Patient states that she has a history of a bulging disk and pinched nerve , based on an examination by a chiropractor (no imaging studies available) Dr Jonnie Finner.  Pain localizes to the  right side and radiates into groin and anterior thigh,  frequently radiates to the foot.   aggravated by sitting, sleeping , and household chores.   Has a young son with Down's syndrome.  Picking him up constantly, weighs about   30 lbs. Has been taking ibuprofen and tylenol.  maniplation by chiropractor did not change the pain .   Wants referral for bariatric surgery.  Has not been able to exercise due to chronic knee pain and now again ben hindered from exercise due to back pain .  Has gained 40 lbs since 2015 .  Not dieting,  Trying to drink a lot of water.  Lost weight in the past  After taking medication from the bariatric clinic (she was prescribed phentermine and B12 )     Has  Quit smoking!    Outpatient Medications Prior to Visit  Medication Sig Dispense Refill  . buPROPion (WELLBUTRIN XL) 300 MG 24 hr tablet Take 1 tablet (300 mg total) by mouth daily. 30 tablet 2  . ibuprofen (ADVIL,MOTRIN) 200 MG tablet Take 600-800 mg by mouth every 6 (six) hours as needed for headache.    Marland Kitchen PARoxetine (PAXIL) 10 MG tablet TAKE 1 TABLET(10 MG) BY MOUTH DAILY 30 tablet 0  . raloxifene (EVISTA) 60 MG tablet Take 1 tablet (60 mg total) by mouth daily. 30 tablet 11   No facility-administered medications prior to visit.     Review of Systems;  Patient denies headache, fevers, malaise, unintentional weight  loss, skin rash, eye pain, sinus congestion and sinus pain, sore throat, dysphagia,  hemoptysis , cough, dyspnea, wheezing, chest pain, palpitations, orthopnea, edema, abdominal pain, nausea, melena, diarrhea, constipation, flank pain, dysuria, hematuria, urinary  Frequency, nocturia, numbness, tingling, seizures,  Focal weakness, Loss of consciousness,  Tremor, insomnia, depression, anxiety, and suicidal ideation.      Objective:  BP (!) 140/98 (BP Location: Left Arm, Patient Position: Sitting, Cuff Size: Normal)   Pulse 94   Temp 98.4 F (36.9 C) (Oral)   Resp 15   Ht 5\' 2"  (1.575 m)   Wt 210 lb 12.8 oz (95.6 kg)   SpO2 98%   BMI 38.56 kg/m   BP Readings from Last 3 Encounters:  02/23/18 (!) 140/98  02/09/18 128/82  10/20/17 118/74    Wt Readings from Last 3 Encounters:  02/23/18 210 lb 12.8 oz (95.6 kg)  02/09/18 208 lb (94.3 kg)  10/20/17 201 lb (91.2 kg)    General appearance: alert, cooperative and appears stated age Ears: normal TM's and external ear canals both ears Throat: lips, mucosa, and tongue normal; teeth and gums normal Neck: no adenopathy, no carotid bruit, supple, symmetrical, trachea midline and thyroid not enlarged, symmetric, no tenderness/mass/nodules Back: symmetric, no curvature. ROM normal. Right paraspinus muscle tenderness, positive straight leg lift.   No CVA tenderness. Lungs: clear to auscultation  bilaterally Heart: regular rate and rhythm, S1, S2 normal, no murmur, click, rub or gallop Abdomen: soft, non-tender; bowel sounds normal; no masses,  no organomegaly Pulses: 2+ and symmetric Skin: Skin color, texture, turgor normal. No rashes or lesions Lymph nodes: Cervical, supraclavicular, and axillary nodes normal.  Lab Results  Component Value Date   HGBA1C 5.4 06/14/2015    Lab Results  Component Value Date   CREATININE 0.81 03/15/2017   CREATININE 1.14 (H) 02/03/2017   CREATININE 1.29 (H) 02/02/2017    Lab Results  Component Value  Date   WBC 7.2 06/23/2017   HGB 14.1 06/23/2017   HCT 42.5 06/23/2017   PLT 238.0 06/23/2017   GLUCOSE 142 (H) 03/15/2017   ALT 14 02/02/2017   AST 18 02/02/2017   NA 138 03/15/2017   K 3.5 03/15/2017   CL 105 03/15/2017   CREATININE 0.81 03/15/2017   BUN 12 03/15/2017   CO2 25 03/15/2017   TSH 2.46 06/23/2017   HGBA1C 5.4 06/14/2015    Assessment & Plan:   Problem List Items Addressed This Visit    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  Limited quantity  of tramadol for moderate pain       Relevant Medications   gabapentin (NEURONTIN) 100 MG capsule   tiZANidine (ZANAFLEX) 4 MG tablet   phentermine (ADIPEX-P) 37.5 MG tablet   Obesity (BMI 30.0-34.9)    Reviewed her previous success at weight loss,   I have addressed  BMI and recommended wt loss of 10% of body weight over the next 6 months using phentermine for appetite suppression,  a low glycemic index diet and regular exercise a minimum of 5 days per week. Goal weight loss 11 lbs by next OV 3 months        Other Visit Diagnoses    Sciatica of right side    -  Primary   Relevant Medications   gabapentin (NEURONTIN) 100 MG capsule   tiZANidine (ZANAFLEX) 4 MG tablet   phentermine (ADIPEX-P) 37.5 MG tablet   Other Relevant Orders   DG Lumbar Spine Complete      I am having Ariana Apley. Weeks start on predniSONE, gabapentin, tiZANidine, phentermine, and traMADol. I am also having her maintain her ibuprofen, raloxifene, PARoxetine, and buPROPion.  Meds ordered this encounter  Medications  . predniSONE (DELTASONE) 10 MG tablet    Sig: 6 tablets on Day 1 , then reduce by 1 tablet daily until gone    Dispense:  21 tablet    Refill:  0  . gabapentin (NEURONTIN) 100 MG capsule    Sig: Take 1 capsule (100 mg total) by mouth 3 (three) times daily.    Dispense:  90 capsule    Refill:  3  . tiZANidine (ZANAFLEX) 4 MG tablet    Sig:  Take 1 tablet (4 mg total) by mouth every 6 (six) hours as needed for muscle spasms.    Dispense:  30 tablet    Refill:  0  . phentermine (ADIPEX-P) 37.5 MG tablet    Sig: Take 1 tablet (37.5 mg total) by mouth daily before breakfast.    Dispense:  30 tablet    Refill:  2  . traMADol (ULTRAM) 50 MG tablet    Sig: Take 1 tablet (50 mg total) by mouth every 8 (eight) hours as needed.    Dispense:  30 tablet    Refill:  0    There are no discontinued medications.  Follow-up: Return in about 3 months (around 05/26/2018) for phentermine .   Sherlene Shamseresa L Arrow Emmerich, MD

## 2018-02-26 DIAGNOSIS — M5387 Other specified dorsopathies, lumbosacral region: Secondary | ICD-10-CM | POA: Insufficient documentation

## 2018-02-26 DIAGNOSIS — E669 Obesity, unspecified: Secondary | ICD-10-CM | POA: Insufficient documentation

## 2018-02-26 NOTE — Assessment & Plan Note (Addendum)
Will treat empirically with gabapentin,  Analgesics and MR.  Limited quantity  of tramadol for moderate pain

## 2018-02-26 NOTE — Assessment & Plan Note (Signed)
Reviewed her previous success at weight loss,   I have addressed  BMI and recommended wt loss of 10% of body weight over the next 6 months using phentermine for appetite suppression,  a low glycemic index diet and regular exercise a minimum of 5 days per week. Goal weight loss 11 lbs by next OV 3 months

## 2018-02-28 ENCOUNTER — Other Ambulatory Visit: Payer: Self-pay | Admitting: Internal Medicine

## 2018-03-02 ENCOUNTER — Telehealth: Payer: Self-pay

## 2018-03-02 NOTE — Telephone Encounter (Signed)
PA for Phentermine has been submitted on covermymeds.

## 2018-03-09 NOTE — Telephone Encounter (Signed)
PA for Phentermine has been approved from 03/02/2018 through 06/02/2018. Pt and pharmacy are aware.

## 2018-04-19 ENCOUNTER — Other Ambulatory Visit: Payer: Self-pay | Admitting: General Surgery

## 2018-04-21 ENCOUNTER — Other Ambulatory Visit: Payer: Self-pay | Admitting: Internal Medicine

## 2018-05-13 ENCOUNTER — Ambulatory Visit: Payer: BC Managed Care – PPO | Admitting: Family Medicine

## 2018-05-13 ENCOUNTER — Encounter: Payer: Self-pay | Admitting: Family Medicine

## 2018-05-13 VITALS — BP 142/84 | HR 101 | Temp 98.1°F | Ht 62.0 in | Wt 214.0 lb

## 2018-05-13 DIAGNOSIS — N3000 Acute cystitis without hematuria: Secondary | ICD-10-CM

## 2018-05-13 DIAGNOSIS — R309 Painful micturition, unspecified: Secondary | ICD-10-CM

## 2018-05-13 LAB — POCT URINALYSIS DIPSTICK
BILIRUBIN UA: NEGATIVE
Glucose, UA: NEGATIVE
Ketones, UA: NEGATIVE
NITRITE UA: NEGATIVE
PROTEIN UA: POSITIVE — AB
Spec Grav, UA: 1.02 (ref 1.010–1.025)
UROBILINOGEN UA: 0.2 U/dL
pH, UA: 6 (ref 5.0–8.0)

## 2018-05-13 MED ORDER — PHENAZOPYRIDINE HCL 100 MG PO TABS: 100.0000 mg | ORAL_TABLET | Freq: Three times a day (TID) | ORAL | 0 refills | Status: DC | PRN

## 2018-05-13 MED ORDER — CEPHALEXIN 500 MG PO CAPS
500.0000 mg | ORAL_CAPSULE | Freq: Two times a day (BID) | ORAL | 0 refills | Status: AC
Start: 2018-05-13 — End: 2018-05-18

## 2018-05-13 NOTE — Patient Instructions (Signed)

## 2018-05-13 NOTE — Progress Notes (Signed)
Subjective:    Patient ID: Ariana Weeks, female    DOB: [birth year], 47 y.o.   MRN: [id number]  HPI   Patient presents to clinic complaining of painful urination for 2 weeks, seeming worse over the past few days.  States she has increased urinary frequency and when she does use the bathroom to urinate, feels as if she cannot fully empty bladder.  Patient does report history of kidney stones, and wants to be sure there is not any blood in urine.  Patient denies seeing any visible blood or pink tinge to urine.  No nausea or vomiting.  Positive lower abdominal pain.  No diarrhea.  No fever or chills.  Patient Active Problem List   Diagnosis Date Noted  . 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 Obesity (BMI 30.0-34.9) 02/26/2018  . Family history of ovarian cancer 10/20/2017  . Chronic pain of right ankle 06/25/2017  . 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 Hydronephrosis of left kidney 02/02/2017  . Anxiety and depression 07/17/2015   Social History   Tobacco Use  . Smoking status: Former Smoker    Packs/day: 0.50    Years: 16.00    Pack years: 8.00    Types: Cigarettes    Last attempt to quit: 06/25/2014    Years since quitting: 3.8  . Smokeless tobacco: Never Used  Substance Use Topics  . Alcohol use: Yes    Alcohol/week: 0.0 standard drinks    Comment: RARE     Review of Systems  Constitutional: Negative for chills, fatigue and fever.  HENT: Negative for congestion, ear pain, sinus pain and sore throat.   Eyes: Negative.   Respiratory: Negative for cough, shortness of breath and wheezing.   Cardiovascular: Negative for chest pain, palpitations and leg swelling.  Gastrointestinal: Negative for diarrhea, nausea and vomiting.  Genitourinary:Positive for dysuria, frequency and urgency.  Musculoskeletal: Negative for arthralgias and myalgias.  Skin: Negative for color change, pallor and rash.  Neurological: Negative for syncope,  light-headedness and headaches.  Psychiatric/Behavioral: The patient is not nervous/anxious.       Objective:   Physical Exam  Constitutional: She appears well-developed and well-nourished. No distress.  Head: Normocephalic and atraumatic.  Eyes: EOM are normal. No scleral icterus.  Neck: Normal range of motion. Neck supple. No tracheal deviation present.  Cardiovascular: Normal rate, regular rhythm and normal heart sounds.  Pulmonary/Chest: Effort normal and breath sounds normal. No respiratory distress.  Abdominal: Soft. Bowel sounds are normal. Mild suprapubic tenderness.  Neurological: She is alert and oriented to person, place, and time.  Gait normal  Skin: Skin is warm and dry. No pallor.  Psychiatric: She has a normal mood and affect. Her behavior is normal. Nursing note and vitals reviewed.     Vitals:   05/13/18 1636 05/13/18 1656  BP: (!) 170/108 (!) 142/84  Pulse: (!) 101   Temp: 98.1 F (36.7 C)   SpO2: 96%     Assessment & Plan:   Dysuria/UTI- patient has some protein and leukocytes in urine.  We will send urine out to lab for culture also.  Patient will begin Keflex 500 mg twice daily for 5 days to treat UTI.  She will also use Pyridium as needed for bladder spasms, patient is aware that this medication can turn urine orange color. Advised to increase water, avoid excess caffeine/sugary drinks, wear cotton underwear, wipe front to back, do good handwashing.  Patient will keep regularly scheduled  follow-up as planned.  Advised to return to clinic sooner if issues arise.

## 2018-05-14 LAB — URINALYSIS, MICROSCOPIC ONLY

## 2018-05-14 LAB — URINE CULTURE
MICRO NUMBER: 91126408
SPECIMEN QUALITY:: ADEQUATE

## 2018-05-28 ENCOUNTER — Ambulatory Visit: Payer: BC Managed Care – PPO | Admitting: Internal Medicine

## 2018-05-28 ENCOUNTER — Encounter: Payer: Self-pay | Admitting: Internal Medicine

## 2018-05-28 VITALS — BP 124/88 | HR 118 | Temp 98.1°F | Resp 15 | Ht 62.0 in | Wt 213.2 lb

## 2018-05-28 DIAGNOSIS — Z23 Encounter for immunization: Secondary | ICD-10-CM

## 2018-05-28 DIAGNOSIS — M5387 Other specified dorsopathies, lumbosacral region: Secondary | ICD-10-CM

## 2018-05-28 DIAGNOSIS — E669 Obesity, unspecified: Secondary | ICD-10-CM | POA: Diagnosis not present

## 2018-05-28 DIAGNOSIS — E782 Mixed hyperlipidemia: Secondary | ICD-10-CM | POA: Diagnosis not present

## 2018-05-28 LAB — LIPID PANEL
Cholesterol: 195 mg/dL (ref 0–200)
HDL: 40.4 mg/dL (ref >39.00)
NONHDL: 154.37
Total CHOL/HDL Ratio: 5
Triglycerides: 306 mg/dL — ABNORMAL HIGH (ref 0.0–149.0)
VLDL: 61.2 mg/dL — AB (ref 0.0–40.0)

## 2018-05-28 LAB — COMPREHENSIVE METABOLIC PANEL
ALT: 18 U/L (ref 0–35)
AST: 18 U/L (ref 0–37)
Albumin: 3.9 g/dL (ref 3.5–5.2)
Alkaline Phosphatase: 59 U/L (ref 39–117)
BUN: 16 mg/dL (ref 6–23)
CHLORIDE: 105 meq/L (ref 96–112)
CO2: 31 meq/L (ref 19–32)
Calcium: 9.7 mg/dL (ref 8.4–10.5)
Creatinine, Ser: 0.86 mg/dL (ref 0.40–1.20)
GFR: 75.81 mL/min (ref >60.00)
GLUCOSE: 188 mg/dL — AB (ref 70–99)
POTASSIUM: 3.9 meq/L (ref 3.5–5.1)
SODIUM: 141 meq/L (ref 135–145)
TOTAL PROTEIN: 6.9 g/dL (ref 6.0–8.3)
Total Bilirubin: 0.5 mg/dL (ref 0.2–1.2)

## 2018-05-28 LAB — LDL CHOLESTEROL, DIRECT: Direct LDL: 115 mg/dL

## 2018-05-28 MED ORDER — LIRAGLUTIDE -WEIGHT MANAGEMENT 18 MG/3ML ~~LOC~~ SOPN: 0.6000 mg | PEN_INJECTOR | Freq: Every day | SUBCUTANEOUS | 0 refills | Status: DC

## 2018-05-28 MED ORDER — TRAMADOL HCL 50 MG PO TABS: 50.0000 mg | ORAL_TABLET | Freq: Two times a day (BID) | ORAL | 5 refills | Status: DC

## 2018-05-28 NOTE — Progress Notes (Signed)
Subjective:  Patient ID: Ariana Weeks, female    DOB: 1970/09/30  Age: 47 y.o. MRN: [id number]  CC: The primary encounter diagnosis was Obesity (BMI 30.0-34.9). 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  HPI Ariana Weeks presents for 3 month follow up on 1)  sciatica managed with tramadol, and 2) obesity managed with trial of phentermine .  Obeisty:  Goal weight loss was 11 lbs by today's visit.  She has gained 3 lbs since her visit in July. She has been tolerating phentermine but feels that it has not suppressed her appetite at all.  Eating salads,  Grilled chicken . No soft drinks. "I don't eat a whole lot of snacks  .  Eats a banana or an apple. Feels bloated by the end of day.   Diet discussed in detail   Not exercising due to persistent back pain.  Pain was controlled with tramadol and tylenol during the day and using gabapentin 2 at night only.  Can't take it during the day    Outpatient Medications Prior to Visit  Medication Sig Dispense Refill  . buPROPion (WELLBUTRIN XL) 300 MG 24 hr tablet TAKE 1 TABLET BY MOUTH DAILY 90 tablet 1  . gabapentin (NEURONTIN) 100 MG capsule Take 1 capsule (100 mg total) by mouth 3 (three) times daily. 90 capsule 3  . ibuprofen (ADVIL,MOTRIN) 200 MG tablet Take 600-800 mg by mouth every 6 (six) hours as needed for headache.    Marland Kitchen PARoxetine (PAXIL) 10 MG tablet TAKE 1 TABLET(10 MG) BY MOUTH DAILY 90 tablet 1  . phenazopyridine (PYRIDIUM) 100 MG tablet Take 1 tablet (100 mg total) by mouth 3 (three) times daily as needed for pain (painful urination). 10 tablet 0  . raloxifene (EVISTA) 60 MG tablet TAKE 1 TABLET(60 MG) BY MOUTH DAILY 30 tablet 12  . traMADol (ULTRAM) 50 MG tablet Take 1 tablet (50 mg total) by mouth every 8 (eight) hours as needed. 30 tablet 0   No facility-administered medications prior to visit.     Review of  Systems;  Patient denies headache, fevers, malaise, unintentional weight loss, skin rash, eye pain, sinus congestion and sinus pain, sore throat, dysphagia,  hemoptysis , cough, dyspnea, wheezing, chest pain, palpitations, orthopnea, edema, abdominal pain, nausea, melena, diarrhea, constipation, flank pain, dysuria, hematuria, urinary  Frequency, nocturia, numbness, tingling, seizures,  Focal weakness, Loss of consciousness,  Tremor, insomnia, depression, anxiety, and suicidal ideation.      Objective:  BP 124/88 (BP Location: Left Arm, Patient Position: Sitting, Cuff Size: Normal)   Pulse (!) 118   Temp 98.1 F (36.7 C) (Oral)   Resp 15   Ht 5\' 2"  (1.575 m)   Wt 213 lb 3.2 oz (96.7 kg)   SpO2 96%   BMI 38.99 kg/m   BP Readings from Last 3 Encounters:  05/28/18 124/88  05/13/18 (!) 142/84  02/23/18 (!) 140/98    Wt Readings from Last 3 Encounters:  05/28/18 213 lb 3.2 oz (96.7 kg)  05/13/18 214 lb (97.1 kg)  02/23/18 210 lb 12.8 oz (95.6 kg)    General appearance: alert, cooperative and appears stated age Ears: normal TM's and external ear canals both ears Throat: lips, mucosa, and tongue normal; teeth and gums normal Neck: no adenopathy, no carotid bruit, supple, symmetrical, trachea midline and thyroid not enlarged, symmetric, no tenderness/mass/nodules Back: symmetric, no curvature. ROM normal. No  CVA tenderness. Lungs: clear to auscultation bilaterally Heart: regular rate and rhythm, S1, S2 normal, no murmur, click, rub or gallop Abdomen: soft, non-tender; bowel sounds normal; no masses,  no organomegaly Pulses: 2+ and symmetric Skin: Skin color, texture, turgor normal. No rashes or lesions Lymph nodes: Cervical, supraclavicular, and axillary nodes normal.  Lab Results  Component Value Date   HGBA1C 5.4 06/14/2015    Lab Results  Component Value Date   CREATININE 0.86 05/28/2018   CREATININE 0.81 03/15/2017   CREATININE 1.14 (H) 02/03/2017    Lab Results    Component Value Date   WBC 7.2 06/23/2017   HGB 14.1 06/23/2017   HCT 42.5 06/23/2017   PLT 238.0 06/23/2017   GLUCOSE 188 (H) 05/28/2018   CHOL 195 05/28/2018   TRIG 306.0 (H) 05/28/2018   HDL 40.40 05/28/2018   LDLDIRECT 115.0 05/28/2018   ALT 18 05/28/2018   AST 18 05/28/2018   NA 141 05/28/2018   K 3.9 05/28/2018   CL 105 05/28/2018   CREATININE 0.86 05/28/2018   BUN 16 05/28/2018   CO2 31 05/28/2018   TSH 2.46 06/23/2017   HGBA1C 5.4 06/14/2015    Mm Diag Breast Tomo Uni Left  Result Date: 02/11/2018 CLINICAL DATA:  47 year old female recalled from screening mammogram dated 02/04/2018 for a possible left breast asymmetry. EXAM: DIGITAL DIAGNOSTIC UNILATERAL LEFT MAMMOGRAM WITH CAD AND TOMO COMPARISON:  Previous exam(s). ACR Breast Density Category c: The breast tissue is heterogeneously dense, which may obscure small masses. FINDINGS: Possible asymmetry in the upper-outer quadrant of the left breast at middle depth does not persist on today's additional spot compression views. Mammographic images were processed with CAD. IMPRESSION: No mammographic evidence of malignancy. RECOMMENDATION: Screening mammogram in one year.(Code:SM-B-01Y) I have discussed the findings and recommendations with the patient. Results were also provided in writing at the conclusion of the visit. If applicable, a reminder letter will be sent to the patient regarding the next appointment. BI-RADS CATEGORY  1: Negative. Electronically Signed   By: Sande Brothers M.D.   On: 02/11/2018 11:02    Assessment & Plan:   Problem List Items Addressed This Visit    Obesity (BMI 30.0-34.9) - Primary    She GAINED weight on phentermine.  Medication d/c'd.  Saxenda prescribed  Diet and exercise reviewed in great detia.  A total of 25 minutes of face to face time was spent with patient more than half of which was spent in counselling about  Diet and exercise .       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  Refilling tramadol for moderate pain .  MRI ordered       Relevant Medications   Liraglutide -Weight Management (SAXENDA) 18 MG/3ML SOPN   Other Relevant Orders   MR Lumbar Spine Wo Contrast    Other Visit Diagnoses    Need for immunization against influenza       Relevant Orders   Flu Vaccine QUAD 36+ mos IM (Completed)      I have changed Ariana Weeks. Brier's traMADol. I am also having her start on Liraglutide -Weight Management. Additionally, I am having her maintain her ibuprofen, gabapentin, PARoxetine, raloxifene, buPROPion, and phenazopyridine.  Meds ordered this encounter  Medications  . Liraglutide -Weight Management (SAXENDA) 18 MG/3ML SOPN  Sig: Inject 0.6 mg into the skin daily. Increase dose weekly as follows: Week 2: 1.2 mg daily ; Week 3: 1.8 mg daily; Week 4: 2.4 mg daily    Dispense:  9 mL    Refill:  0  . traMADol (ULTRAM) 50 MG tablet    Sig: Take 1 tablet (50 mg total) by mouth 2 (two) times daily.    Dispense:  60 tablet    Refill:  5    Medications Discontinued During This Encounter  Medication Reason  . traMADol (ULTRAM) 50 MG tablet     Follow-up: Return in about 3 months (around 08/28/2018).   Sherlene Shams, MD

## 2018-05-28 NOTE — Patient Instructions (Addendum)
I am recommending use of the medication called Saxenda to help you lose weight.  It is similar to a a medicine that is used to treat diabetes called Victoza,  So It may lower your blood sugars .   It is injected daily in incrementally increasing doses (if tolerated,  Nausea usually resolves in a few days)"  0.6 mg daily   Week 1 1.2 mg daily Week 2 1.8 mg  Daly Week 3 2.4 mg daily Week 4 3.0 mg daily Week 5 and ongoing   If you want to bring the pen with you to be shown how to give yourself the dose,  We wil make you an  RN visit once you pick up your medication from your pharmacy     Stop eating cereal and instant oatmeal  for breakfast  There are low carb "on the go" breakfast drinks and bars: 1) try the premixed protein drinks (Atkins, AdvantEdge and the best tasting , highest protein one available is called " Premier Protein"; it is advocated by the bariatric surgeons for their patients and  available of < $2 serving at [facility] and  In bulk for $1.50/serving at [employer] and [employer]  .    Nutritional analysis :  160 cal  30 g protein  1 g sugar 50% calcium needs   [employer] and [employer]  Mid morning ,  You can have an apple and a tbs of of peanut butter,  or a KIND bar  KIND : make sure you find the  "low glycemic index"  variety   For lunch:  TAYLOR FARMS BAGGED SALAD  without croutons,  Or a sandwhich on whole wheat (NOT HONEY WHEAT )  AND A HANDFUL OF BAKED CHIPS!   FOR DINNER:  A GRILLED OR BAKED PROTEIN  PLUS A SIDE SALAD     FOR ICE CREAM/SNACK :  BREYER'S CARB SMART FUDGSICLES AND ICE CREAM BARS   DANNON LT N  FIT GREE K  YOGURT TOPPED W ITH  REDDIWHIP    I AM ORDERING AN MRI OF YOUR LUMBAR SPINE

## 2018-05-30 NOTE — Assessment & Plan Note (Signed)
She GAINED weight on phentermine.  Medication d/c'd.  Saxenda prescribed  Diet and exercise reviewed in great detia.  A total of 25 minutes of face to face time was spent with patient more than half of which was spent in counselling about  Diet and exercise .

## 2018-05-30 NOTE — Assessment & Plan Note (Signed)
She has chronic pain despite daily use of gabapentin,  Analgesics and MR.  Refilling tramadol for moderate pain .  MRI ordered

## 2018-06-02 ENCOUNTER — Telehealth: Payer: Self-pay | Admitting: Internal Medicine

## 2018-06-02 NOTE — Telephone Encounter (Signed)
Copied from CRM [id number]. Topic: General - Other >> Jun 01, 2018  5:04 PM Marylen Ponto wrote: Reason for CRM: Pt states she was informed by her pharmacy that a prior authorization is needed for the Rx for Liraglutide -Weight Management (SAXENDA) 18 MG/3ML SOPN.

## 2018-06-02 NOTE — Telephone Encounter (Signed)
Please start the PA for saxenda

## 2018-06-07 NOTE — Telephone Encounter (Signed)
PA for Saxenda has been submitted on covermymeds.  

## 2018-06-09 ENCOUNTER — Ambulatory Visit (INDEPENDENT_AMBULATORY_CARE_PROVIDER_SITE_OTHER): Payer: BC Managed Care – PPO | Admitting: Lab

## 2018-06-09 VITALS — BP 140/98 | HR 98 | Temp 98.1°F

## 2018-06-09 DIAGNOSIS — R7309 Other abnormal glucose: Secondary | ICD-10-CM | POA: Diagnosis not present

## 2018-06-09 LAB — POCT GLYCOSYLATED HEMOGLOBIN (HGB A1C): HEMOGLOBIN A1C: 5.3 % (ref 4.0–5.6)

## 2018-06-09 NOTE — Progress Notes (Signed)
Pt here for A1c check level 5.3.

## 2018-06-15 NOTE — Telephone Encounter (Signed)
Patient is calling for a update on her Pre authorization for her medication. Please advise

## 2018-06-15 NOTE — Telephone Encounter (Signed)
Spoke with pt to let her know that the prior authorization was submitted on 06/07/2018 and again today. Explained to pt that proir authorizations take time but that we would contact her as soon as we got a response from her insurance company.

## 2018-06-16 ENCOUNTER — Ambulatory Visit
Admission: RE | Admit: 2018-06-16 | Discharge: 2018-06-16 | Disposition: A | Discharge status: Home / Self Care | Payer: BC Managed Care – PPO | Source: Ambulatory Visit | Attending: Internal Medicine | Admitting: Internal Medicine

## 2018-06-16 DIAGNOSIS — M5441 Lumbago with sciatica, right side: Secondary | ICD-10-CM | POA: Diagnosis not present

## 2018-06-16 DIAGNOSIS — M5127 Other intervertebral disc displacement, lumbosacral region: Secondary | ICD-10-CM | POA: Insufficient documentation

## 2018-06-16 DIAGNOSIS — M5136 Other intervertebral disc degeneration, lumbar region: Secondary | ICD-10-CM | POA: Diagnosis not present

## 2018-06-16 DIAGNOSIS — E669 Obesity, unspecified: Secondary | ICD-10-CM

## 2018-06-16 DIAGNOSIS — M5387 Other specified dorsopathies, lumbosacral region: Secondary | ICD-10-CM | POA: Insufficient documentation

## 2018-06-16 NOTE — Telephone Encounter (Signed)
PA for [name] has been approved through 10/16/2017. Pt is aware of approval. Pt stated that she would call pharmacy to let them know to fill the rx.

## 2018-06-23 ENCOUNTER — Telehealth: Payer: Self-pay | Admitting: Internal Medicine

## 2018-06-23 NOTE — Telephone Encounter (Signed)
Copied from CRM [id number]. Topic: Quick Communication - See Telephone Encounter >> Jun 23, 2018  3:33 PM Lorrine Kin, [place] wrote: CRM for notification. See Telephone encounter for: 06/23/18. Patient calling and would like a call regarding her MRI results from 06/16/18. Please advise.

## 2018-06-23 NOTE — Telephone Encounter (Signed)
Please see result note message.  

## 2018-06-24 ENCOUNTER — Other Ambulatory Visit: Payer: Self-pay | Admitting: Internal Medicine

## 2018-06-24 DIAGNOSIS — M5387 Other specified dorsopathies, lumbosacral region: Secondary | ICD-10-CM

## 2018-06-24 NOTE — Progress Notes (Signed)
amb ref to ch

## 2018-06-28 ENCOUNTER — Telehealth: Payer: Self-pay | Admitting: Internal Medicine

## 2018-06-28 MED ORDER — PEN NEEDLES 31G X 6 MM MISC: 1 refills | Status: DC

## 2018-06-28 NOTE — Telephone Encounter (Signed)
Copied from CRM [id number]. Topic: General - Other >> Jun 28, 2018  9:31 AM Herby Abraham C wrote: Reason for CRM: pt says that she need a Rx for needles for her SAXENDA. Pharmacy advised her that they don't have any on file.    Pharmacy: [id number] [place], [place] - [address] AT [facility] OF [address] [phone] (Phone) [phone] (Fax)

## 2018-06-28 NOTE — Telephone Encounter (Signed)
Needles for saxenda have been sent in.

## 2018-08-02 ENCOUNTER — Ambulatory Visit: Payer: Self-pay

## 2018-08-02 NOTE — Telephone Encounter (Signed)
Pt c/o abdominal bloating. She stated she has had bloating for several months and looks pregnant. She stated while there is no pain, the bloating is so much, that it makes her uncomfortable. Pt denies abdominal pain, constipation, diarrhea, nausea or vomiting.  Pt c/o bilateral calf swelling that has progressively gotten worse over the last few months.  The edema does not look red or infected nor is it painful to touch. Pt denies fever. Pt denies any calf pain. Pt stated she has never had calf swelling before. Pt have mild SOB with exertion.  Care advice given and pt verbalized understanding. Appt made to pt tomorrow 1:20 with Leanora CoverLauren Guse FNP.  Reason for Disposition . [1] MODERATE leg swelling (e.g., swelling extends up to knees) AND [2] new onset or worsening . Nursing judgment or information in reference  Answer Assessment - Initial Assessment Questions 1. LOCATION: "Where does it hurt?"      Bloating looks pregancy 2. RADIATION: "Does the pain shoot anywhere else?" (e.g., chest, back)     n/a 3. ONSET: "When did the pain begin?" (e.g., minutes, hours or days ago)  n/a 4. SUDDEN: "Gradual or sudden onset?"  gradual 5. PATTERN "Does the pain come and go, or is it constant?"    - If constant: "Is it getting better, staying the same, or worsening?"      (Note: Constant means the pain never goes away completely; most serious pain is constant and it progresses)     - If intermittent: "How long does it last?" "Do you have pain now?"     (Note: Intermittent means the pain goes away completely between bouts)     *No Answer* 6. SEVERITY: "How bad is the pain?"  (e.g., Scale 1-10; mild, moderate, or severe)   - MILD (1-3): doesn't interfere with normal activities, abdomen soft and not tender to touch    - MODERATE (4-7): interferes with normal activities or awakens from sleep, tender to touch    - SEVERE (8-10): excruciating pain, doubled over, unable to do any normal activities      *No  Answer* 7. RECURRENT SYMPTOM: "Have you ever had this type of abdominal pain before?" If so, ask: "When was the last time?" and "What happened that time?"      *No Answer* 8. CAUSE: "What do you think is causing the abdominal pain?"     *No Answer* 9. RELIEVING/AGGRAVATING FACTORS: "What makes it better or worse?" (e.g., movement, antacids, bowel movement)     *No Answer* 10. OTHER SYMPTOMS: "Has there been any vomiting, diarrhea, constipation, or urine problems?"       *No Answer* 11. PREGNANCY: "Is there any chance you are pregnant?" "When was your last menstrual period?"       *No Answer*  Answer Assessment - Initial Assessment Questions 1. ONSET: "When did the swelling start?" (e.g., minutes, hours, days)   Progressively gotten worse over 3 months 2. LOCATION: "What part of the leg is swollen?"  "Are both legs swollen or just one leg?"   bialteral  calves  3. SEVERITY: "How bad is the swelling?" (e.g., localized; mild, moderate, severe)  - Localized - small area of swelling localized to one leg  - MILD pedal edema - swelling limited to foot and ankle, pitting edema < 1/4 inch (6 mm) deep, rest and elevation eliminate most or all swelling  - MODERATE edema - swelling of lower leg to knee, pitting edema > 1/4 inch (6 mm) deep, rest and elevation only  partially reduce swelling  - SEVERE edema - swelling extends above knee, facial or hand swelling present      moderate 4. REDNESS: "Does the swelling look red or infected?"     no 5. PAIN: "Is the swelling painful to touch?" If so, ask: "How painful is it?"   (Scale 1-10; mild, moderate or severe)     no 6. FEVER: "Do you have a fever?" If so, ask: "What is it, how was it measured, and when did it start?"      no 7. CAUSE: "What do you think is causing the leg swelling?"     Pt doesn't know  8. MEDICAL HISTORY: "Do you have a history of heart failure, kidney disease, liver failure, or cancer?"     no 9. RECURRENT SYMPTOM: "Have you had  leg swelling before?" If so, ask: "When was the last time?" "What happened that time?"     no 10. OTHER SYMPTOMS: "Do you have any other symptoms?" (e.g., chest pain, difficulty breathing)     mild  SOB with exertion 11. PREGNANCY: "Is there any chance you are pregnant?" "When was your last menstrual period?"       n/a  Answer Assessment - Initial Assessment Questions 1. REASON FOR CALL: "What is your main concern right now?"     Abdominal bloating 2. ONSET: "When did the bloating start?"     *No Answer* 3. SEVERITY: "How bad is the bloating?"     Pt stated she feels like she looks like she is pregnant and people are asking if she is pregnant 4. FEVER: "Do you have a fever?"     no 5. OTHER SYMPTOMS: "Do you have any other new symptoms?"     Stated that she feels hungry and is gaining weight- no constipation or diarrhea. 6. INTERVENTIONS AND RESPONSE: "What have you done so far to try to make this better? What medications have you used?"     None.  7. PREGNANCY: "Is there any chance you are pregnant?" no  Protocols used: LEG SWELLING AND EDEMA-A-AH, NO GUIDELINE AVAILABLE-A-AH, ABDOMINAL PAIN - [facility]

## 2018-08-03 ENCOUNTER — Ambulatory Visit: Payer: BC Managed Care – PPO | Admitting: Family Medicine

## 2018-08-03 ENCOUNTER — Encounter: Payer: Self-pay | Admitting: Family Medicine

## 2018-08-03 VITALS — BP 122/86 | HR 103 | Temp 98.0°F | Ht 62.0 in | Wt 223.2 lb

## 2018-08-03 DIAGNOSIS — R140 Abdominal distension (gaseous): Secondary | ICD-10-CM

## 2018-08-03 DIAGNOSIS — K219 Gastro-esophageal reflux disease without esophagitis: Secondary | ICD-10-CM | POA: Diagnosis not present

## 2018-08-03 DIAGNOSIS — R1084 Generalized abdominal pain: Secondary | ICD-10-CM | POA: Diagnosis not present

## 2018-08-03 LAB — COMPREHENSIVE METABOLIC PANEL
ALK PHOS: 60 U/L (ref 39–117)
ALT: 18 U/L (ref 0–35)
AST: 18 U/L (ref 0–37)
Albumin: 3.7 g/dL (ref 3.5–5.2)
BILIRUBIN TOTAL: 0.4 mg/dL (ref 0.2–1.2)
BUN: 14 mg/dL (ref 6–23)
CO2: 31 meq/L (ref 19–32)
CREATININE: 0.66 mg/dL (ref 0.40–1.20)
Calcium: 9 mg/dL (ref 8.4–10.5)
Chloride: 104 mEq/L (ref 96–112)
GFR: 102.81 mL/min (ref >60.00)
GLUCOSE: 127 mg/dL — AB (ref 70–99)
Potassium: 4.1 mEq/L (ref 3.5–5.1)
Sodium: 139 mEq/L (ref 135–145)
TOTAL PROTEIN: 6.2 g/dL (ref 6.0–8.3)

## 2018-08-03 LAB — POCT URINALYSIS DIPSTICK
Bilirubin, UA: NEGATIVE
Blood, UA: NEGATIVE
Glucose, UA: NEGATIVE
KETONES UA: NEGATIVE
Leukocytes, UA: NEGATIVE
Nitrite, UA: NEGATIVE
Protein, UA: NEGATIVE
Spec Grav, UA: 1.015 (ref 1.010–1.025)
Urobilinogen, UA: 0.2 E.U./dL
pH, UA: 7 (ref 5.0–8.0)

## 2018-08-03 LAB — CBC WITH DIFFERENTIAL/PLATELET
BASOS ABS: 0 10*3/uL (ref 0.0–0.1)
Basophils Relative: 0.2 % (ref 0.0–3.0)
EOS ABS: 0.1 10*3/uL (ref 0.0–0.7)
Eosinophils Relative: 1.3 % (ref 0.0–5.0)
HCT: 37.4 % (ref 36.0–46.0)
Hemoglobin: 12.6 g/dL (ref 12.0–15.0)
LYMPHS ABS: 1.7 10*3/uL (ref 0.7–4.0)
LYMPHS PCT: 25.9 % (ref 12.0–46.0)
MCHC: 33.7 g/dL (ref 30.0–36.0)
MCV: 89.8 fl (ref 78.0–100.0)
MONOS PCT: 7.2 % (ref 3.0–12.0)
Monocytes Absolute: 0.5 10*3/uL (ref 0.1–1.0)
NEUTROS ABS: 4.2 10*3/uL (ref 1.4–7.7)
NEUTROS PCT: 65.4 % (ref 43.0–77.0)
PLATELETS: 226 10*3/uL (ref 150.0–400.0)
RBC: 4.17 Mil/uL (ref 3.87–5.11)
RDW: 13.5 % (ref 11.5–15.5)
WBC: 6.4 10*3/uL (ref 4.0–10.5)

## 2018-08-03 LAB — LIPASE: Lipase: 19 U/L (ref 11.0–59.0)

## 2018-08-03 LAB — AMYLASE: Amylase: 37 U/L (ref 27–131)

## 2018-08-03 LAB — POCT URINE PREGNANCY: Preg Test, Ur: NEGATIVE

## 2018-08-03 MED ORDER — OMEPRAZOLE 20 MG PO CPDR: 20.0000 mg | DELAYED_RELEASE_CAPSULE | Freq: Every day | ORAL | 3 refills | Status: DC

## 2018-08-03 NOTE — Progress Notes (Signed)
Subjective:    Patient ID: Ariana Weeks, female    DOB: 1971-07-27, 47 y.o.   MRN: [id number]  HPI  Patient presents to clinic c/o ABD pain and bloating for the past month or so.  Patient states no matter what she eats, she seems to feel bloated.  Denies belching or passing gas more than usual.  States by the end of the day she will have to go lay down due to feeling still full, bloated and tired.  Patient states she usually has a few bowel movements per day, this has not changed.  Denies any diarrhea.  Denies any issues with urination.  Denies any vomiting.  Does feel nauseous at times and also describes a heartburn sensation in upper abdomen.  Denies any fever or chills.  Patient also complains of bilateral calf swelling.  Patient states she just feels bloated and swollen from her abdomen all the way down to her feet.  Patient reports she is been working on weight loss with taking Saxenda.  Patient states with the Saxenda she did not notice any decrease in her appetite, actually felt like it made her more hungry, so she stopped taking it.  Patient Active Problem List   Diagnosis Date Noted  . 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 Obesity (BMI 30.0-34.9) 02/26/2018  . Family history of ovarian cancer 10/20/2017  . Chronic pain of right ankle 06/25/2017  . 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 Hydronephrosis of left kidney 02/02/2017  . Anxiety and depression 07/17/2015   Social History   Tobacco Use  . Smoking status: Former Smoker    Packs/day: 0.50    Years: 16.00    Pack years: 8.00    Types: Cigarettes    Last attempt to quit: 06/25/2014    Years since quitting: 4.1  . Smokeless tobacco: Never Used  Substance Use Topics  . Alcohol use: Yes    Alcohol/week: 0.0 standard drinks    Comment: RARE   Review of Systems  Constitutional: Negative for chills, fatigue and fever.  HENT: Negative for congestion, ear pain, sinus pain  and sore throat.   Eyes: Negative.   Respiratory: Negative for cough, shortness of breath and wheezing.   Cardiovascular: Negative for chest pain, palpitations Gastrointestinal: +abdominal pain/bloating/heartburn, diarrhea, nausea and vomiting.  Genitourinary: Negative for dysuria, frequency and urgency.  Musculoskeletal: Negative for arthralgias and myalgias.  Skin: Negative for color change, pallor and rash.  Neurological: Negative for syncope, light-headedness and headaches.  Psychiatric/Behavioral: The patient is not nervous/anxious.       Objective:   Physical Exam  Constitutional: She is oriented to person, place, and time. No distress.  HENT:  Head: Normocephalic and atraumatic.  Eyes: Conjunctivae and EOM are normal. No scleral icterus.  Neck: Neck supple. No JVD present. No tracheal deviation present.  Cardiovascular: Normal rate and regular rhythm.  Pulmonary/Chest: Effort normal and breath sounds normal. No respiratory distress. She has no wheezes. She has no rales.  Abdominal: Soft. Bowel sounds are normal. She exhibits distension (ABD appears round and full). There is tenderness (generalized ABD tenderness).  Obese ABD  Musculoskeletal: Normal range of motion. She exhibits no edema.  No pitting edema.   Neurological: She is alert and oriented to person, place, and time.  Skin: Skin is warm and dry. No rash noted. She is not diaphoretic. No erythema. No pallor.  Psychiatric: She has a normal mood and affect. Her behavior is normal.  Nursing note and vitals reviewed.     Wt Readings from Last 3 Encounters:  08/03/18 223 lb 3.2 oz (101.2 kg)  05/28/18 213 lb 3.2 oz (96.7 kg)  05/13/18 214 lb (97.1 kg)   Vitals:   08/03/18 1326  BP: 122/86  Pulse: (!) 103  Temp: 98 F (36.7 C)  SpO2: 96%   Body mass index is 40.82 kg/m.  Assessment & Plan:    Generalized abdominal pain/bloating symptom- unclear reason for abdominal pain and bloating.  We will get lab work  including CBC, CMP, amylase, lipase, thyroid panel.  Patient advised to follow a bland diet over the next few days and slowly advance diet as tolerated.  GERD- patient will begin taking Prilosec 20 mg once per day to help reduce acid reflux symptoms and hopefully improve bloating symptoms as well.  Morbid obesity-patient has stopped taking the Saxenda due to thinking it made her feel more hungry rather than help suppress appetite.  Discussed healthy diet and exercise and trying to decrease overall calorie intake with goal of weight loss.  Patient aware that someone will be contacting her with results of blood work and also with scheduling information regarding her CT scan of abdomen and pelvis.  Patient will keep regularly scheduled follow-up as planned with PCP, advised to return to clinic sooner if any issues arise or if current symptoms persist or worsen.  Results of lab work and CT scan will help us better determine next step in plan of care in regards to abdominal pain.

## 2018-08-04 LAB — THYROID PANEL WITH TSH
FREE THYROXINE INDEX: 2.1 (ref 1.4–3.8)
T3 UPTAKE: 27 % (ref 22–35)
T4 TOTAL: 7.8 ug/dL (ref 5.1–11.9)
TSH: 2.25 m[IU]/L

## 2018-08-19 ENCOUNTER — Other Ambulatory Visit: Payer: Self-pay | Admitting: Internal Medicine

## 2018-08-23 ENCOUNTER — Other Ambulatory Visit: Payer: Self-pay | Admitting: Internal Medicine

## 2018-08-26 ENCOUNTER — Ambulatory Visit: Payer: BC Managed Care – PPO

## 2018-08-27 ENCOUNTER — Ambulatory Visit: Payer: BC Managed Care – PPO | Admitting: Internal Medicine

## 2018-08-27 ENCOUNTER — Encounter: Payer: Self-pay | Admitting: Internal Medicine

## 2018-08-27 VITALS — BP 140/85 | HR 115 | Temp 98.1°F | Wt 222.2 lb

## 2018-08-27 DIAGNOSIS — M5387 Other specified dorsopathies, lumbosacral region: Secondary | ICD-10-CM

## 2018-08-27 DIAGNOSIS — E669 Obesity, unspecified: Secondary | ICD-10-CM

## 2018-08-27 DIAGNOSIS — R140 Abdominal distension (gaseous): Secondary | ICD-10-CM

## 2018-08-27 MED ORDER — TRAMADOL HCL 50 MG PO TABS: 50.0000 mg | ORAL_TABLET | Freq: Three times a day (TID) | ORAL | 5 refills | Status: DC | PRN

## 2018-08-27 NOTE — Patient Instructions (Signed)
I have refilled your tramadol for use up to 3 times  Daily  Stick to your current diet.    30 minutes of treadmill daily.  Speed up and incline once it gets easy   Pelvic  ultrasound ordered to rule out ovarian mass as cause for abdominal distension

## 2018-08-27 NOTE — Progress Notes (Signed)
Subjective:  Patient ID: Ariana Weeks, female    DOB: [birth year]  Age: 48 y.o. MRN: [id number]  CC: The primary encounter diagnosis was Abdominal bloating. 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  HPI Ariana Weeks presents for follow up on multiple issues  1)  back pain  .  She was referred to Dr Shirlyn Goltz  . Has had 2nd  ESI procedure on S1 nerve root . Does not feel that it helped much . However, the radiculitis has improved from constant to 1-2 times  Per week , aggravated by bending over.    2) Obesity: Did not tolerate Saxenda.  Felt that it made her hungrier.  Increased the dose weekly .  Has gained 9 lbs since starting the medication. Has not been walking on treadmill due to her back pain  3) Abdomen is distended.  NOT CONSTIPATED .Has family history of ovarian CA    Lab Results  Component Value Date   HGBA1C 5.3 06/09/2018      Outpatient Medications Prior to Visit  Medication Sig Dispense Refill  . buPROPion (WELLBUTRIN XL) 300 MG 24 hr tablet TAKE 1 TABLET BY MOUTH DAILY 90 tablet 1  . gabapentin (NEURONTIN) 100 MG capsule TAKE 1 CAPSULE(100 MG) BY MOUTH THREE TIMES DAILY 90 capsule 3  . ibuprofen (ADVIL,MOTRIN) 200 MG tablet Take 600-800 mg by mouth every 6 (six) hours as needed for headache.    . Insulin Pen Needle (PEN NEEDLES) 31G X 6 MM MISC Use for saxenda injection 30 each 1  . PARoxetine (PAXIL) 10 MG tablet TAKE 1 TABLET(10 MG) BY MOUTH DAILY 90 tablet 1  . raloxifene (EVISTA) 60 MG tablet TAKE 1 TABLET(60 MG) BY MOUTH DAILY 30 tablet 12  . phenazopyridine (PYRIDIUM) 100 MG tablet Take 1 tablet (100 mg total) by mouth 3 (three) times daily as needed for pain (painful urination). 10 tablet 0  . traMADol (ULTRAM) 50 MG tablet Take 1 tablet (50 mg total) by mouth 2 (two) times daily. 60 tablet 5  . Liraglutide -Weight  Management (SAXENDA) 18 MG/3ML SOPN Inject 0.6 mg into the skin daily. Increase dose weekly as follows: Week 2: 1.2 mg daily ; Week 3: 1.8 mg daily; Week 4: 2.4 mg daily 9 mL 0  . omeprazole (PRILOSEC) 20 MG capsule Take 1 capsule (20 mg total) by mouth daily. 30 capsule 3   No facility-administered medications prior to visit.     Review of Systems;  Patient denies headache, fevers, malaise, unintentional weight loss, skin rash, eye pain, sinus congestion and sinus pain, sore throat, dysphagia,  hemoptysis , cough, dyspnea, wheezing, chest pain, palpitations, orthopnea, edema, abdominal pain, nausea, melena, diarrhea, constipation, flank pain, dysuria, hematuria, urinary  Frequency, nocturia, numbness, tingling, seizures,  Focal weakness, Loss of consciousness,  Tremor, insomnia, depression, anxiety, and suicidal ideation.      Objective:  BP 140/85 (BP Location: Left Arm, Patient Position: Sitting, Cuff Size: Large)   Pulse (!) 115   Temp 98.1 F (36.7 C) (Oral)   Wt 222 lb 3.2 oz (100.8 kg)   SpO2 95%   BMI 40.64 kg/m   BP Readings from Last 3 Encounters:  08/27/18 140/85  08/03/18 122/86  06/09/18 (!) 140/98    Wt Readings from Last 3 Encounters:  08/27/18 222 lb 3.2 oz (100.8 kg)  08/03/18 223 lb 3.2 oz (101.2 kg)  05/28/18 213 lb 3.2 oz (96.7 kg)    General appearance: alert, cooperative and appears stated age Ears: normal TM's and external ear canals both ears Throat: lips, mucosa, and tongue normal; teeth and gums normal Neck: no adenopathy, no carotid bruit, supple, symmetrical, trachea midline and thyroid not enlarged, symmetric, no tenderness/mass/nodules Back: symmetric, no curvature. ROM normal. No CVA tenderness. Lungs: clear to auscultation bilaterally Heart: regular rate and rhythm, S1, S2 normal, no murmur, click, rub or gallop Abdomen: soft, non-tender; bowel sounds normal; no masses,  no organomegaly Pulses: 2+ and symmetric Skin: Skin color, texture,  turgor normal. No rashes or lesions Lymph nodes: Cervical, supraclavicular, and axillary nodes normal.  Lab Results  Component Value Date   HGBA1C 5.3 06/09/2018   HGBA1C 5.4 06/14/2015    Lab Results  Component Value Date   CREATININE 0.66 08/03/2018   CREATININE 0.86 05/28/2018   CREATININE 0.81 03/15/2017    Lab Results  Component Value Date   WBC 6.4 08/03/2018   HGB 12.6 08/03/2018   HCT 37.4 08/03/2018   PLT 226.0 08/03/2018   GLUCOSE 127 (H) 08/03/2018   CHOL 195 05/28/2018   TRIG 306.0 (H) 05/28/2018   HDL 40.40 05/28/2018   LDLDIRECT 115.0 05/28/2018   ALT 18 08/03/2018   AST 18 08/03/2018   NA 139 08/03/2018   K 4.1 08/03/2018   CL 104 08/03/2018   CREATININE 0.66 08/03/2018   BUN 14 08/03/2018   CO2 31 08/03/2018   TSH 2.25 08/03/2018   HGBA1C 5.3 06/09/2018    Assessment & Plan:   Problem List Items Addressed This Visit    Abdominal distension    ovarina CA needs to be ruled out given FH.  CA 125 and ultrasound ordered       Obesity (BMI 30.0-34.9)    She has gained 9 lbs on Saxenda.  Discussed referral to Weight Management. 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  Tramadol refilled        Other Visit Diagnoses    Abdominal bloating    -  Primary   Relevant Orders   CA 125   US Pelvis Complete   Morbid obesity (HCC)       Relevant Orders   Amb Ref to Medical Weight Management      I have discontinued Freddie Apley. Siers's phenazopyridine, Liraglutide -Weight Management, and omeprazole. I have also changed her traMADol. Additionally, I am having her maintain her ibuprofen, raloxifene, buPROPion, Pen Needles, gabapentin, and PARoxetine.  Meds ordered this encounter  Medications  . traMADol (ULTRAM) 50 MG tablet    Sig: Take 1 tablet (50 mg total) by mouth 3 (three) times daily as needed.    Dispense:   90 tablet    Refill:  5    Medications Discontinued During This Encounter  Medication Reason  . Liraglutide -Weight Management (SAXENDA) 18 MG/3ML SOPN Patient Preference  . omeprazole (PRILOSEC) 20 MG capsule Patient Preference  . phenazopyridine (PYRIDIUM) 100 MG tablet Patient Preference  . traMADol (ULTRAM) 50 MG tablet Reorder    Follow-up: Return in about 6 months (around 02/25/2019).   Sherlene Shams, MD

## 2018-08-29 DIAGNOSIS — R140 Abdominal distension (gaseous): Secondary | ICD-10-CM | POA: Insufficient documentation

## 2018-08-29 NOTE — Assessment & Plan Note (Signed)
ovarina CA needs to be ruled out given FH.  CA 125 and ultrasound ordered

## 2018-08-29 NOTE — Assessment & Plan Note (Addendum)
She has gained 9 lbs on Saxenda.  Discussed referral to Weight Management. Stop Saxenda ,  Start walking on  Treadmill 30 minutes daily

## 2018-08-29 NOTE — Assessment & Plan Note (Signed)
Radiculopathy improved after second ESI.  Tramadol refilled

## 2018-08-30 LAB — CA 125: CA 125: 9 U/mL (ref <35)

## 2018-09-01 ENCOUNTER — Telehealth: Payer: Self-pay | Admitting: Internal Medicine

## 2018-09-01 NOTE — Telephone Encounter (Signed)
Patient calling to obtain lab results. Nurse triage/nurse of the day currently unavailable. Patient unable to hold, going into another appointment. Please advise.    Copied from CRM [id number]. Topic: Quick Communication - Lab Results (Clinic Use ONLY) >> Sep 01, 2018  1:37 PM Sandy Salaam, CMA wrote: Called patient to inform them of 08/31/2018 lab results. When patient returns call, triage nurse may disclose results.  PEC may speak with pt.

## 2018-09-08 ENCOUNTER — Ambulatory Visit: Payer: BC Managed Care – PPO

## 2018-09-08 ENCOUNTER — Telehealth: Payer: Self-pay | Admitting: *Deleted

## 2018-09-08 DIAGNOSIS — R1084 Generalized abdominal pain: Secondary | ICD-10-CM

## 2018-09-08 NOTE — Telephone Encounter (Signed)
Clydie Braun from [facility] calling back to advise they also need this to be authorized. Questions? cb [phone]

## 2018-09-08 NOTE — Telephone Encounter (Signed)
Called Ariana Weeks back. This is an ultrasound. Ultrasounds does NOT require authorizations.

## 2018-09-08 NOTE — Telephone Encounter (Signed)
I have pended the transvaginal radiology requesting with the Abdominal US.

## 2018-09-08 NOTE — Telephone Encounter (Signed)
Thanks . I have signed it

## 2018-09-08 NOTE — Telephone Encounter (Signed)
Copied from CRM [id number]. Topic: General - Inquiry >> Sep 08, 2018 10:20 AM Windy Kalata, NT wrote: Reason for CRM: Alinda Money is calling from [facility] and states the patient is scheduled to come in tomorrow for an order that states Pelvic Ultrasound but states the radiologist is also needing a transvaginal order.  Alinda Money [phone]

## 2018-09-09 ENCOUNTER — Ambulatory Visit: Payer: BC Managed Care – PPO | Attending: Internal Medicine

## 2018-09-09 ENCOUNTER — Telehealth: Payer: Self-pay

## 2018-09-09 DIAGNOSIS — R140 Abdominal distension (gaseous): Secondary | ICD-10-CM

## 2018-09-09 NOTE — Telephone Encounter (Signed)
Copied from CRM [id number]. Topic: General - Other >> Sep 09, 2018  3:52 PM Gaynelle Adu wrote: Reason for CRM: [facility]- radiology is calling to advise to patient did not show too her appt. Please advise

## 2018-09-13 ENCOUNTER — Telehealth: Payer: Self-pay | Admitting: Internal Medicine

## 2018-09-13 NOTE — Telephone Encounter (Signed)
Copied from CRM [id number]. Topic: Quick Communication - See Telephone Encounter >> Sep 13, 2018 11:30 AM Arlyss Gandy, NT wrote: CRM for notification. See Telephone encounter for: 09/13/18. Pt states she would like to see if Dr. Darrick Huntsman can call her in phentermine (ADIPEX-P) 37.5 MG tablet as she started taking it again from the old rx and had some left and feels like it is working for her. Please advise.

## 2018-09-13 NOTE — Telephone Encounter (Signed)
LMTCB. PEC may speak with pt.  

## 2018-09-13 NOTE — Telephone Encounter (Signed)
Yes, but first I need to know why she didn't keep the appointment for the ultrasound to rule out ovarian cancer.

## 2018-09-14 ENCOUNTER — Telehealth: Payer: Self-pay

## 2018-09-14 MED ORDER — PHENTERMINE HCL 37.5 MG PO TBDP: 1.0000 | ORAL_TABLET | Freq: Every day | ORAL | 2 refills | Status: DC

## 2018-09-14 NOTE — Telephone Encounter (Signed)
Copied from CRM [id number]. Topic: Quick Communication - See Telephone Encounter >> Sep 13, 2018  5:21 PM Sandy Salaam, CMA wrote: CRM for notification. See Telephone encounter for: 09/13/18.  PEC may speak with pt. >> Sep 13, 2018  5:30 PM Mcneil, Jannifer Rodney wrote: Pt stated she did not keep the appt because she had not received her flex card. Pt stated once she receives the flex card she will call to reschedule the appt.

## 2018-09-14 NOTE — Telephone Encounter (Signed)
Med refilled.  Please ask patinet to record stARTING WEIGHT AND FOLLOW UP IN 3 MONTHS

## 2018-09-14 NOTE — Telephone Encounter (Signed)
See previous telephone encounter.

## 2018-09-14 NOTE — Addendum Note (Signed)
Addended by: Sherlene ShamsULLO, Caprice Wasko L on: 09/14/2018 01:22 PM   Modules accepted: Orders

## 2018-09-14 NOTE — Telephone Encounter (Signed)
FYI

## 2018-09-15 NOTE — Telephone Encounter (Signed)
Left detailed msg letting pt know to record start weight and call office to schedule appt

## 2018-09-16 ENCOUNTER — Telehealth: Payer: Self-pay | Admitting: Internal Medicine

## 2018-09-16 NOTE — Telephone Encounter (Signed)
Copied from CRM [id number]. Topic: Quick Communication - See Telephone Encounter >> Sep 16, 2018  4:35 PM Jens Som A wrote: CRM for notification. See Telephone encounter for: 09/16/18.  Patient is calling because the Phentermine HCl 37.5 MG TBDP [[id number]] was not called in. But a generic was called in.  "something that dissolves on her toungue."  Patient was advised that the Phentermine HCl 37.5 MG TBDP [[id number]] was called into the pharmacy 09/14/18.  Can the script be resent? Please advise thank you.

## 2018-09-17 MED ORDER — PHENTERMINE HCL 37.5 MG PO TABS: 37.5000 mg | ORAL_TABLET | Freq: Every day | ORAL | 2 refills | Status: DC

## 2018-09-17 NOTE — Telephone Encounter (Signed)
LMTCB. PEC may speak with pt.  

## 2018-09-17 NOTE — Telephone Encounter (Signed)
Adipex has been resent  She needs to follow up with me in one month

## 2018-10-26 ENCOUNTER — Telehealth: Payer: Self-pay

## 2018-10-26 ENCOUNTER — Ambulatory Visit: Payer: BC Managed Care – PPO | Admitting: Internal Medicine

## 2018-10-26 NOTE — Telephone Encounter (Signed)
PA for Ariana Weeks has been approved from 10/26/2018 to 02/25/2019. LMTCB. Please transfer pt to our office.

## 2018-10-26 NOTE — Telephone Encounter (Signed)
PA for Saxenda has been submitted on covermymeds.  

## 2018-11-01 ENCOUNTER — Telehealth: Payer: Self-pay

## 2018-11-01 NOTE — Telephone Encounter (Signed)
I have discontinued the phentermine please call her pharmacy to dc any refills on it,    You can cancel her appt.

## 2018-11-01 NOTE — Addendum Note (Signed)
Addended by: Sherlene Shams on: 11/01/2018 12:54 PM   Modules accepted: Orders

## 2018-11-01 NOTE — Telephone Encounter (Signed)
Copied from CRM [id number]. Topic: General - Other >> Nov 01, 2018  8:54 AM Herby Abraham C wrote: Reason for CRM: pt called in to be advised. Pt says at her last ov she was seen for having stomach pain and advised to have [place] completed and follow up after starting phentermine. Pt says that her f/u apt is coming up and she has not been taking medication OR has not had [place] completed because pt says that she is no longer having stomach pain.   Pt would like to be advised by provider on if she still need to keep apt? Pt says that she doesn't feel that she need to  CB: [phone]

## 2018-11-03 NOTE — Telephone Encounter (Signed)
Cancelled refills and left detailed message to cancel appt. I have cancelled the appt and advised that she will need to call the office to rescheule

## 2018-11-05 IMAGING — MR MR LUMBAR SPINE W/O CM
5 of 7 series · 31 of 48 positions shown · non-contrast
Comparison: CT abdomen and pelvis 02/02/2017.

CLINICAL DATA: Low back pain radiating into the left leg for 1
year. No known injury.

EXAM:
MRI LUMBAR SPINE WITHOUT CONTRAST
TECHNIQUE: Multiplanar, multisequence MR imaging of the lumbar spine was
performed. No intravenous contrast was administered.

[Series 5: T2 · sagittal · 4.0mm · 0.81mm/px · 8 of 17 slices shown (1 of 3)]
[im 1/17]
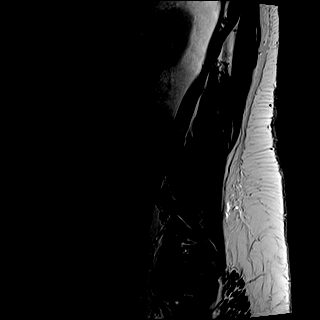
[im 3/17]
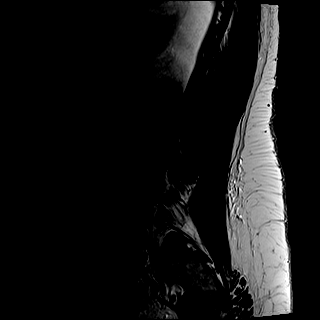
[im 5/17]
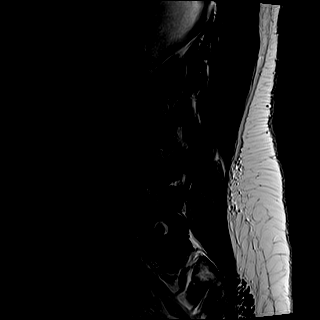
[im 7/17]
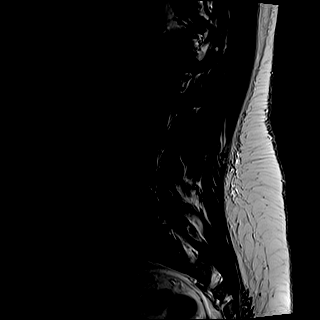
[im 10/17]
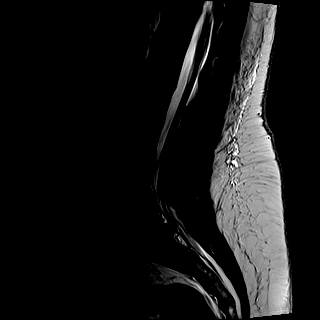
[im 12/17]
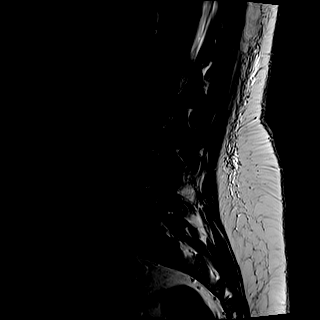
[im 14/17]
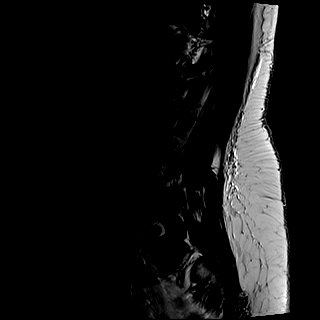
[im 17/17]
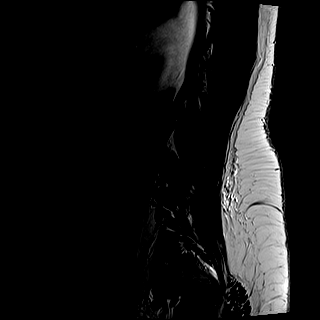

[Series 6: T1 · sagittal · 4.0mm · 0.81mm/px · 7 of 17 slices shown (1 of 2)]
[im 1/17]
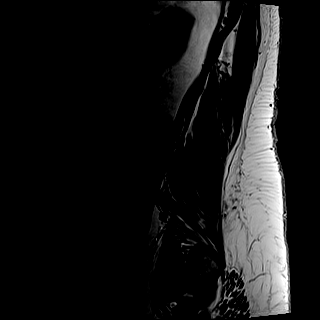
[im 3/17]
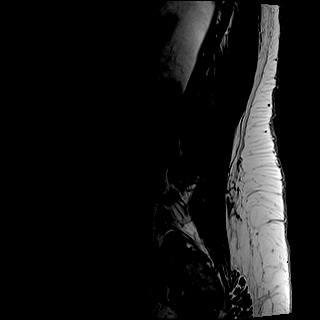
[im 6/17]
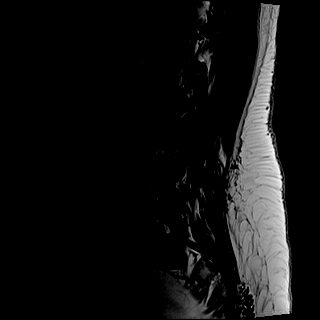
[im 9/17]
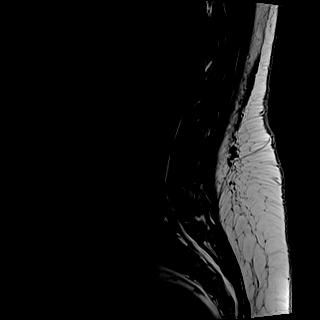
[im 11/17]
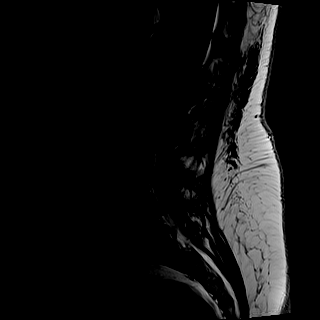
[im 14/17]
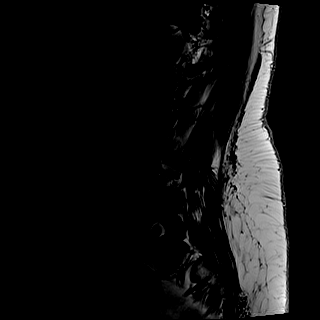
[im 17/17]
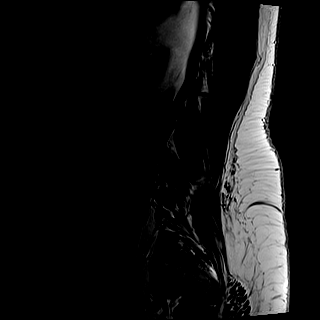

[Series 8: T2 · axial · 4.0mm · 0.57mm/px · z∈[-75,+17]mm · 8 of 20 slices shown (2 of 3)]
[im 1/20]
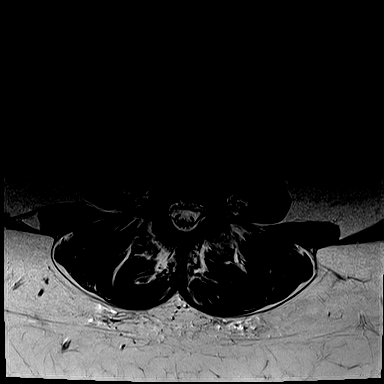
[im 3/20]
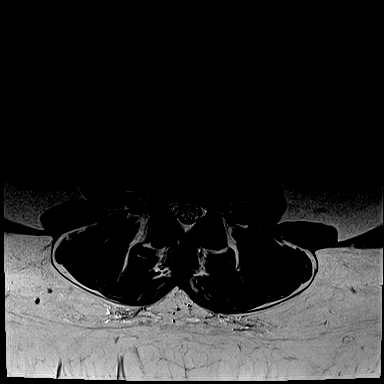
[im 6/20]
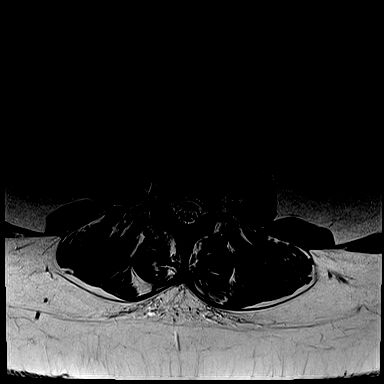
[im 9/20]
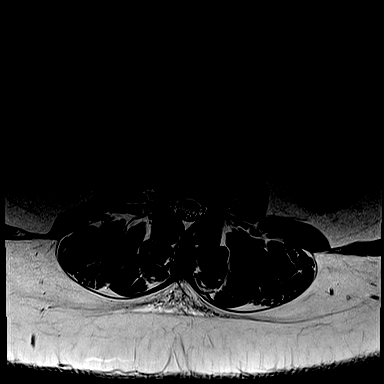
[im 11/20]
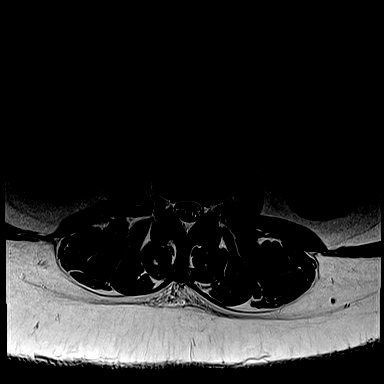
[im 14/20]
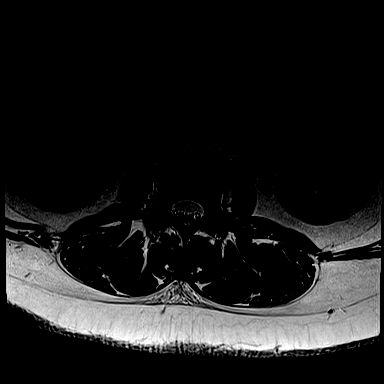
[im 17/20]
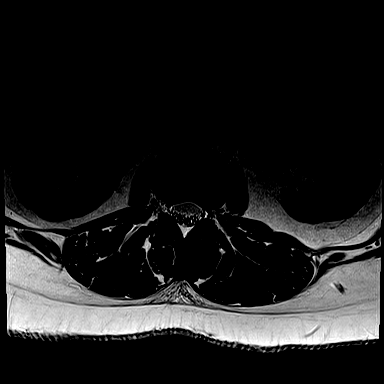
[im 20/20]
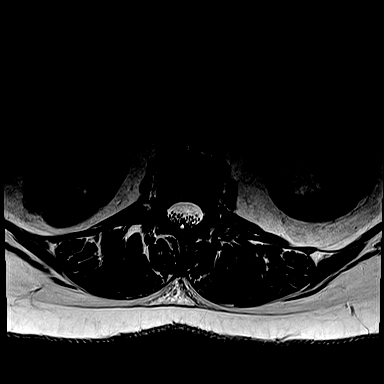

[Series 9: T2 · axial · 4.0mm · 0.57mm/px · z∈[-197,-143]mm · 5 of 13 slices shown (3 of 3)]
[im 1/13]
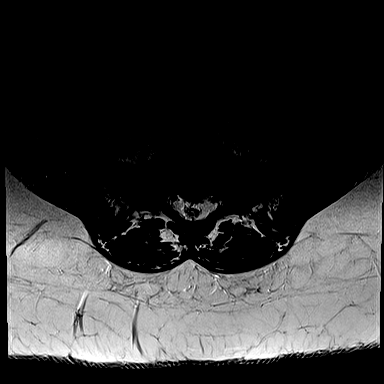
[im 4/13]
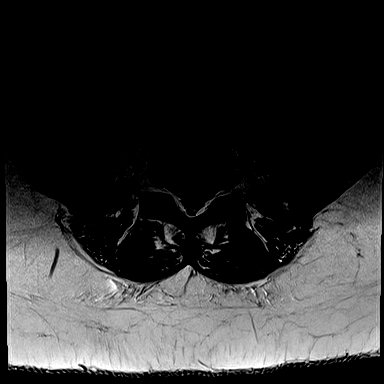
[im 7/13]
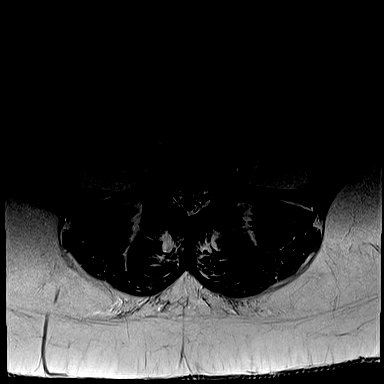
[im 10/13]
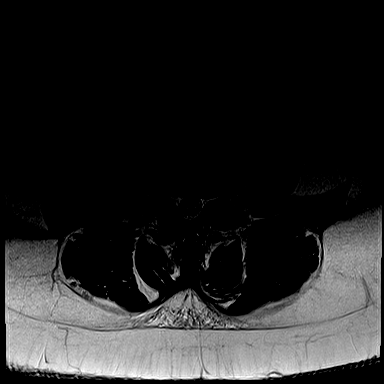
[im 13/13]
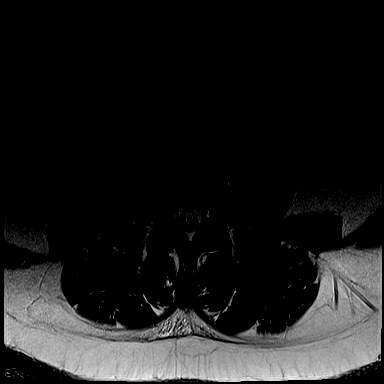

[Series 10: T1 · axial · 4.0mm · 0.34mm/px · z∈[-75,-51]mm · 3 of 20 slices shown (2 of 2)]
[im 1/20]
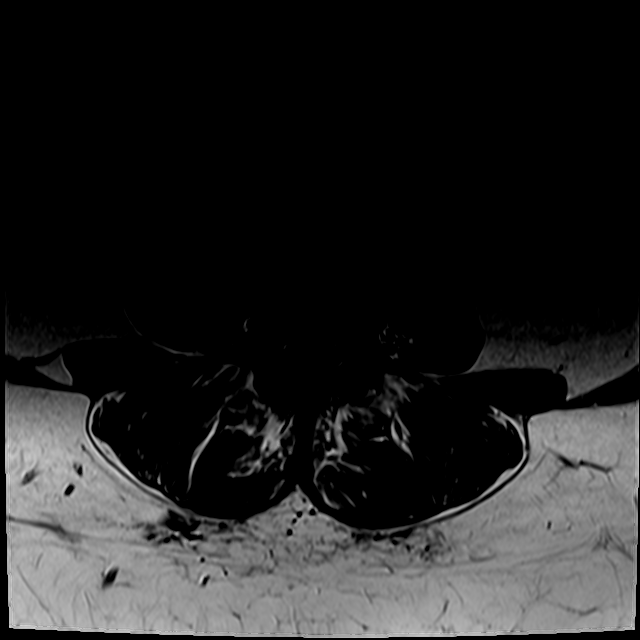
[im 3/20]
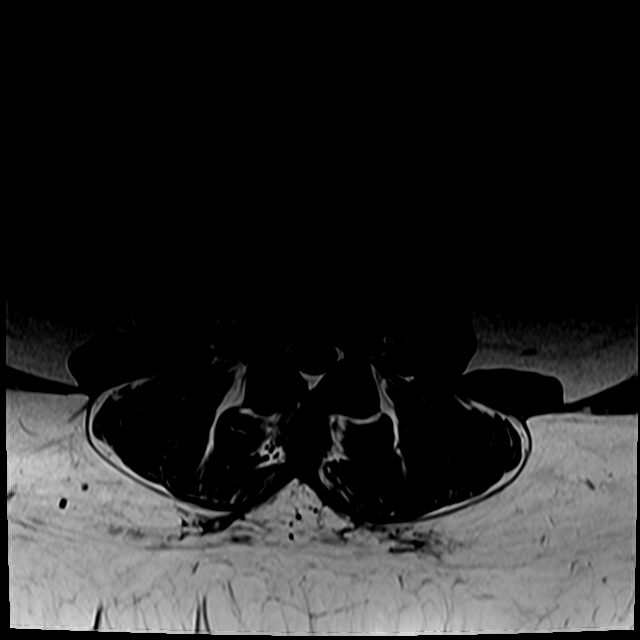
[im 6/20]
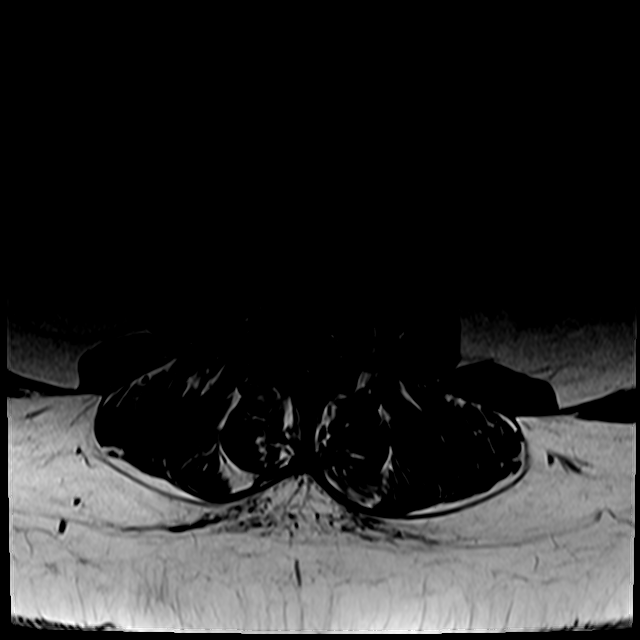

[31 of 48 positions shown; findings below may reference images not displayed]

FINDINGS: Segmentation:  Standard.

Alignment:  Normal.

Vertebrae:  Height and signal are normal.

Conus medullaris and cauda equina: Conus extends to the L1 level.
Conus and cauda equina appear normal.

Paraspinal and other soft tissues: Negative.

Disc levels:

T11-12 and T12-L1 are imaged in the sagittal plane only. The central
canal and foramina are open at both levels with minimal disc bulging
at T11-12 noted.

L1-2: Negative.

L2-3: Negative.

L3-4: Mild facet degenerative change.  Otherwise negative.

L4-5: Moderate to advanced facet degenerative disease with
associated mild marrow edema in the L4-5 and L5 pedicles consistent
with stress change. Small subchondral cysts in the right superior
facet of L5 and in the left lamina of L4 also noted. Minimal disc
bulge. The central canal and foramina are open.

L5-S1: The patient has a broad-based central protrusion with slight
caudal extension. The disc slightly indents the ventral thecal sac
and contacts the S1 roots but the roots are not compressed or
displaced. There is mild facet degenerative disease. The foramina
are open.
IMPRESSION: Central disc protrusion at L5-S1 slightly indents the ventral thecal
sac and contacts the S1 roots without compression or displacement.

Lower lumbar facet degenerative disease most notable at L4-5. Mild
marrow edema in the L4 and L5 pedicles consistent with stress change
related to facet arthropathy noted.

## 2018-11-09 ENCOUNTER — Ambulatory Visit: Payer: BC Managed Care – PPO

## 2018-11-15 ENCOUNTER — Ambulatory Visit: Payer: BC Managed Care – PPO | Admitting: Internal Medicine

## 2018-11-24 ENCOUNTER — Other Ambulatory Visit: Payer: Self-pay | Admitting: Internal Medicine

## 2018-12-07 ENCOUNTER — Ambulatory Visit: Payer: BC Managed Care – PPO | Admitting: Internal Medicine

## 2019-01-03 ENCOUNTER — Other Ambulatory Visit: Payer: Self-pay | Admitting: Internal Medicine

## 2019-01-03 DIAGNOSIS — Z1231 Encounter for screening mammogram for malignant neoplasm of breast: Secondary | ICD-10-CM

## 2019-01-31 ENCOUNTER — Other Ambulatory Visit: Payer: Self-pay

## 2019-01-31 ENCOUNTER — Other Ambulatory Visit: Payer: BC Managed Care – PPO

## 2019-01-31 ENCOUNTER — Ambulatory Visit: Payer: Self-pay | Admitting: Internal Medicine

## 2019-01-31 ENCOUNTER — Encounter: Payer: Self-pay | Admitting: Internal Medicine

## 2019-01-31 ENCOUNTER — Telehealth: Payer: Self-pay | Admitting: *Deleted

## 2019-01-31 ENCOUNTER — Ambulatory Visit (INDEPENDENT_AMBULATORY_CARE_PROVIDER_SITE_OTHER): Payer: BC Managed Care – PPO | Admitting: Internal Medicine

## 2019-01-31 DIAGNOSIS — Z20828 Contact with and (suspected) exposure to other viral communicable diseases: Secondary | ICD-10-CM

## 2019-01-31 DIAGNOSIS — Z20822 Contact with and (suspected) exposure to covid-19: Secondary | ICD-10-CM

## 2019-01-31 DIAGNOSIS — M5387 Other specified dorsopathies, lumbosacral region: Secondary | ICD-10-CM

## 2019-01-31 MED ORDER — TRAMADOL HCL 50 MG PO TABS: 50.0000 mg | ORAL_TABLET | Freq: Three times a day (TID) | ORAL | 1 refills | Status: DC | PRN

## 2019-01-31 MED ORDER — BUPROPION HCL ER (XL) 300 MG PO TB24: 300.0000 mg | ORAL_TABLET | Freq: Every day | ORAL | 1 refills | Status: DC

## 2019-01-31 NOTE — Progress Notes (Signed)
Virtual Visit via [email or web address]  This visit type was conducted due to national recommendations for restrictions regarding the COVID-19 pandemic (e.g. social distancing).  This format is felt to be most appropriate for this patient at this time.  All issues noted in this document were discussed and addressed.  No physical exam was performed (except for noted visual exam findings with Video Visits).   I connected with@ on 01/31/19 at 10:30 AM EDT by a video enabled telemedicine application or telephone and verified that I am speaking with the correct person using two identifiers. Location patient: home Location provider: work or home office Persons participating in the virtual visit: patient, provider  I discussed the limitations, risks, security and privacy concerns of performing an evaluation and management service by telephone and the availability of in person appointments. I also discussed with the patient that there may be a patient responsible charge related to this service. The patient expressed understanding and agreed to proceed.  Reason for visit: RECENT covid exposure at work HPI:  Patient is a Radio producer in [employer] and was notified by HD that a "close contact" tested positive for COVID 19 on June 1.  Patient has been going to work so she assumes it was a Teacher, adult education.    Patient has had no symptoms of illness except a headache which is mild and started today only.  Her son is 4 and has Down's Syndrome.  He was fussy and noted to have a sore throat last week by pediatrician.     ROS: See pertinent positives and negatives per HPI.  Past Medical History:  Diagnosis Date  . Depression   . Frequent headaches   . Head cold   . Headache(784.0)   . History of frequent urinary tract infections   . History of kidney stones   . Kidney stones   . Left ureteral calculus   . 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 [name]    Past Surgical History:  Procedure Laterality  Date  . BREAST BIOPSY Right 02/02/2013   [place] bx/clip-neg  . BREAST BIOPSY Right 02/19/2017   affirm, ATYPICAL LOBULAR HYPERPLASIA  . BREAST BIOPSY Right 03/04/2017   Procedure: BREAST BIOPSY WITH NEEDLE LOCALIZATION;  Surgeon: [name], MD;  Location: [facility] ORS;  Service: General;  Laterality: Right;  . BREAST LUMPECTOMY Right 2018  . CESAREAN SECTION  2005  . CHOLECYSTECTOMY  09/2012  . CYSTOSCOPY W/ RETROGRADES N/A 02/02/2017   Procedure: CYSTOSCOPY WITH RETROGRADE PYELOGRAM;  Surgeon: [name], MD;  Location: [facility] ORS;  Service: Urology;  Laterality: N/A;  . EXTRACORPOREAL SHOCK WAVE LITHOTRIPSY  2009  . INTRAUTERINE DEVICE INSERTION  MARCH 2012   MURINA  . [place]  (APPROX)   Kidney Stone  . URETEROSCOPY WITH HOLMIUM LASER LITHOTRIPSY Left 02/02/2017   Procedure: URETEROSCOPY WITH HOLMIUM LASER LITHOTRIPSY;  Surgeon: [name], MD;  Location: [facility] ORS;  Service: Urology;  Laterality: Left;    Family History  Problem Relation Age of Onset  . Cancer Father        Lung  . Other Mother        breast cyst  . Alcohol abuse Maternal Uncle   . Alcohol abuse Paternal Uncle   . Ovarian cancer Maternal Grandmother 53  . Cancer Maternal Grandfather        Lymphoma  . Cancer Maternal Aunt 67       great aunt breast late 33's  . Alcohol abuse Paternal  Grandfather   . Breast cancer Neg Hx     SOCIAL HX: married,  Has a 684 yr old son with down's syndrome, employed Chartered loss adjusterschoolteacher [place]    Current Outpatient Medications:  .  buPROPion (WELLBUTRIN XL) 300 MG 24 hr tablet, TAKE 1 TABLET BY MOUTH DAILY, Disp: 90 tablet, Rfl: 1 .  gabapentin (NEURONTIN) 100 MG capsule, TAKE 1 CAPSULE(100 MG) BY MOUTH THREE TIMES DAILY, Disp: 90 capsule, Rfl: 3 .  ibuprofen (ADVIL,MOTRIN) 200 MG tablet, Take 600-800 mg by mouth every 6 (six) hours as needed for headache., Disp: , Rfl:  .  PARoxetine (PAXIL) 10 MG tablet, TAKE 1 TABLET(10 MG) BY MOUTH DAILY, Disp:  90 tablet, Rfl: 1 .  raloxifene (EVISTA) 60 MG tablet, TAKE 1 TABLET(60 MG) BY MOUTH DAILY, Disp: 30 tablet, Rfl: 12 .  traMADol (ULTRAM) 50 MG tablet, Take 1 tablet (50 mg total) by mouth 3 (three) times daily as needed., Disp: 90 tablet, Rfl: 5  EXAM:  VITALS per patient if applicable:  GENERAL: alert, oriented, appears well and in no acute distress  HEENT: atraumatic, conjunttiva clear, no obvious abnormalities on inspection of external nose and ears  NECK: normal movements of the head and neck  LUNGS: on inspection no signs of respiratory distress, breathing rate appears normal, no obvious gross SOB, gasping or wheezing  CV: no obvious cyanosis  MS: moves all visible extremities without noticeable abnormality  PSYCH/NEURO: pleasant and cooperative, no obvious depression or anxiety, speech and thought processing grossly intact  ASSESSMENT AND PLAN:  Discussed the following assessment and plan:  Close Exposure to Covid-19 Virus  Close Exposure to Covid-19 Virus Testing to be done today.   Colleague was positive on June 1 .  Advised to isolate, self quarantine and avoid contact with son     I discussed the assessment and treatment plan with the patient. The patient was provided an opportunity to ask questions and all were answered. The patient agreed with the plan and demonstrated an understanding of the instructions.   The patient was advised to call back or seek an in-person evaluation if the symptoms worsen or if the condition fails to improve as anticipated.  I provided 25 minutes of non-face-to-face time during this encounter.   Sherlene Shamseresa L Nomie Buchberger, MD

## 2019-01-31 NOTE — Telephone Encounter (Signed)
Patient was notified by Health dept she needed to be tested due to exposure- office is calling to request testing  Call to patient- left message to call back to schedule testing- order has been placed.

## 2019-01-31 NOTE — Telephone Encounter (Signed)
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   The Health Department called me last night and let me know I've been exposed to COVID-19 on January 24, 2019 by an employee.  I warm transferred the call to Dr. Lupita Dawn office and Juliann Pulse is scheduling her for a virtual visit due to the COVID-19 pandemic.  I sent these notes to the office.  Reason for Disposition . [1] COVID-19 EXPOSURE (Close Contact) within last 14 days AND [2] needs COVID-19 lab test to return to work AND [3] NO symptoms  Answer Assessment - Initial Assessment Questions 1. CLOSE CONTACT: "Who is the person with the confirmed or suspected COVID-19 infection that you were exposed to?"     I was exposed to someone postive for the COVID-19 on January 24, 2019 2. PLACE of CONTACT: "Where were you when you were exposed to COVID-19?" (e.g., home, school, medical waiting room; which city?)     School where I work 3. TYPE of CONTACT: "How much contact was there?" (e.g., sitting next to, live in same house, work in same office, same building)     Health Department didn't tell me.   4. DURATION of CONTACT: "How long were you in contact with the COVID-19 patient?" (e.g., a few seconds, passed by person, a few minutes, live with the patient)     Don't know.   They can't reveal that information. 5. DATE of CONTACT: "When did you have contact with a COVID-19 patient?" (e.g., how many days ago)     January 24, 2019 6. TRAVEL: "Have you traveled out of the country recently?" If so, "When and where?"     * Also ask about out-of-state travel, since the CDC has identified some high-risk cities for community spread in the [place].     * Note: Travel becomes less relevant if there is widespread community transmission where the patient lives.     No travels 7. COMMUNITY SPREAD: "Are there lots of cases of COVID-19 (community spread) where you live?" (See public health department website, if unsure)       Yes 8. SYMPTOMS: "Do you have any symptoms?" (e.g., fever, cough,  breathing difficulty)     No   I have a son with Down's Syndrome 9. PREGNANCY OR POSTPARTUM: "Is there any chance you are pregnant?" "When was your last menstrual period?" "Did you deliver in the last 2 weeks?"     No 10. HIGH RISK: "Do you have any heart or lung problems? Do you have a weak immune system?" (e.g., CHF, COPD, asthma, HIV positive, chemotherapy, renal failure, diabetes mellitus, sickle cell anemia)       No  Protocols used: CORONAVIRUS (COVID-19) EXPOSURE-A-AH

## 2019-01-31 NOTE — Assessment & Plan Note (Signed)
Radiculopathy improved after second ESI.  Tramadol refilled for management of low back pain

## 2019-01-31 NOTE — Assessment & Plan Note (Addendum)
Testing to be done today.   Colleague was positive on June 1 .  Advised to isolate, self quarantine and avoid contact with son

## 2019-01-31 NOTE — Patient Instructions (Addendum)
You will be called with an appointment for COVID 19 TESTING .  WEAR A MASK AND GLOVES AT HOME WHEN NOT ALONE IN YOUR ROOM.  KEEP THE 6 FOOT DISTANCE AS MUCH AS POSSIBLE FROM YOUR FAMILY. covi

## 2019-01-31 NOTE — Telephone Encounter (Signed)
Patient returned call and scheduled for testing at [facility] site on 01/31/19 at 11:45 am. Pt advised to wear a mask and remain in car for appt. Understanding verbalized.

## 2019-02-01 LAB — NOVEL CORONAVIRUS, NAA: SARS-CoV-2, NAA: NOT DETECTED

## 2019-02-02 ENCOUNTER — Telehealth: Payer: Self-pay | Admitting: *Deleted

## 2019-02-02 NOTE — Telephone Encounter (Signed)
Pt calling for Covid results; negative. Reviewed with pt, verbalizes understanding. 

## 2019-02-14 ENCOUNTER — Ambulatory Visit (INDEPENDENT_AMBULATORY_CARE_PROVIDER_SITE_OTHER): Payer: BC Managed Care – PPO | Admitting: Obstetrics and Gynecology

## 2019-02-14 ENCOUNTER — Encounter: Payer: Self-pay | Admitting: Obstetrics and Gynecology

## 2019-02-14 ENCOUNTER — Other Ambulatory Visit: Payer: Self-pay

## 2019-02-14 ENCOUNTER — Ambulatory Visit
Admission: RE | Admit: 2019-02-14 | Discharge: 2019-02-14 | Disposition: A | Discharge status: Home / Self Care | Payer: BC Managed Care – PPO | Source: Ambulatory Visit | Attending: Internal Medicine | Admitting: Internal Medicine

## 2019-02-14 VITALS — BP 120/80 | Ht 62.0 in | Wt 222.0 lb

## 2019-02-14 DIAGNOSIS — D0501 Lobular carcinoma in situ of right breast: Secondary | ICD-10-CM

## 2019-02-14 DIAGNOSIS — Z1231 Encounter for screening mammogram for malignant neoplasm of breast: Secondary | ICD-10-CM

## 2019-02-14 DIAGNOSIS — Z30431 Encounter for routine checking of intrauterine contraceptive device: Secondary | ICD-10-CM

## 2019-02-14 DIAGNOSIS — Z8041 Family history of malignant neoplasm of ovary: Secondary | ICD-10-CM

## 2019-02-14 DIAGNOSIS — Z01419 Encounter for gynecological examination (general) (routine) without abnormal findings: Secondary | ICD-10-CM | POA: Diagnosis not present

## 2019-02-14 DIAGNOSIS — Z1239 Encounter for other screening for malignant neoplasm of breast: Secondary | ICD-10-CM

## 2019-02-14 NOTE — Patient Instructions (Signed)
I value your feedback and entrusting us with your care. If you get a [facility] patient survey, I would appreciate you taking the time to let us know about your experience today. Thank you! 

## 2019-02-14 NOTE — Progress Notes (Signed)
PCP:  [name], MD   Chief Complaint  Patient presents with  . Gynecologic Exam     HPI:      [name] is a 48 y.o. [id number] who LMP was No LMP recorded. (Menstrual status: IUD)., presents today for her annual examination.  Her menses are absent with IUD.  Dysmenorrhea none. She does not have intermenstrual bleeding.  Sex activity: single partner, contraception - IUD. Mirena placed 09/25/14. May want a replacement in 2021. Last Pap: 08/20/15  Results were: no abnormalities /neg HPV DNA  Hx of STDs: none  Last mammogram: today, normal results.  Hx of atypical lobular hyperplasia and lobular CIS with Dr. [name] . Pt doing evista for 5 yrs--on year 2 (couldn't take tamoxifen with her mood meds).  There is a new FH of breast cancer in her mom (lobular CIS). There is a FH of ovarian cancer in her MGM, genetic testing declined by pt for a couple yrs. She is willing to have it this year. The patient does not do self-breast exams.   Tobacco use: The patient denies current or previous tobacco use. Alcohol use: none No drug use.  Exercise: min active  She does not get adequate calcium and Vitamin D in her diet.  Labs with PCP.  Pt frustrated by wt gain with evista, hot flashes. Tried 2 wt loss meds with PCP and gained wt. Pt considering bariatric proc mtg at [facility] for more info.   Past Medical History:  Diagnosis Date  . Depression   . Frequent headaches   . Head cold   . Headache(784.0)   . History of frequent urinary tract infections   . History of kidney stones   . Kidney stones   . Left ureteral calculus   . 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 [name]  . Lobular carcinoma in situ     Past Surgical History:  Procedure Laterality Date  . BREAST BIOPSY Right 02/02/2013   [place] bx/clip-neg  . BREAST BIOPSY Right 02/19/2017   affirm, ATYPICAL LOBULAR HYPERPLASIA  . BREAST BIOPSY Right 03/04/2017   Procedure: BREAST BIOPSY WITH NEEDLE LOCALIZATION;   Surgeon: [name], MD;  Location: [facility] ORS;  Service: General;  Laterality: Right;  . BREAST LUMPECTOMY Right 2018  . CESAREAN SECTION  2005  . CHOLECYSTECTOMY  09/2012  . CYSTOSCOPY W/ RETROGRADES N/A 02/02/2017   Procedure: CYSTOSCOPY WITH RETROGRADE PYELOGRAM;  Surgeon: [name], MD;  Location: [facility] ORS;  Service: Urology;  Laterality: N/A;  . EXTRACORPOREAL SHOCK WAVE LITHOTRIPSY  2009  . INTRAUTERINE DEVICE INSERTION  MARCH 2012   MURINA  . [place]  (APPROX)   Kidney Stone  . URETEROSCOPY WITH HOLMIUM LASER LITHOTRIPSY Left 02/02/2017   Procedure: URETEROSCOPY WITH HOLMIUM LASER LITHOTRIPSY;  Surgeon: [name], MD;  Location: [facility] ORS;  Service: Urology;  Laterality: Left;    Family History  Problem Relation Age of Onset  . Cancer Father        Lung  . Other Mother        breast cyst  . Alcohol abuse Maternal Uncle   . Alcohol abuse Paternal Uncle   . Ovarian cancer Maternal Grandmother 53  . Cancer Maternal Grandfather        Lymphoma  . Cancer Maternal Aunt 61       great aunt breast late 67's  . Alcohol abuse Paternal Grandfather   . Breast cancer Neg Hx  Social History   Socioeconomic History  . Marital status: Married    Spouse name: Not on file  . Number of children: 1  . Years of education: Not on file  . Highest education level: Not on file  Occupational History  . Occupation: Special ED Teacher    Comment: [employer]  Social Needs  . Financial resource strain: Not on file  . Food insecurity    Worry: Not on file    Inability: Not on file  . Transportation needs    Medical: Not on file    Non-medical: Not on file  Tobacco Use  . Smoking status: Former Smoker    Packs/day: 0.50    Years: 16.00    Pack years: 8.00    Types: Cigarettes    Quit date: 06/25/2014    Years since quitting: 4.6  . Smokeless tobacco: Never Used  Substance and Sexual Activity  . Alcohol use: Yes    Alcohol/week: 0.0  standard drinks    Comment: RARE  . Drug use: No  . Sexual activity: Yes    Birth control/protection: I.U.D.  Lifestyle  . Physical activity    Days per week: Not on file    Minutes per session: Not on file  . Stress: Not on file  Relationships  . Social Herbalist on phone: Not on file    Gets together: Not on file    Attends religious service: Not on file    Active member of club or organization: Not on file    Attends meetings of clubs or organizations: Not on file    Relationship status: Not on file  . Intimate partner violence    Fear of current or ex partner: Not on file    Emotionally abused: Not on file    Physically abused: Not on file    Forced sexual activity: Not on file  Other Topics Concern  . Not on file  Social History Narrative   No regular exercise.      Nutrition: 3 meals a day, limited fruits and vegetables, fast meals occasionally      Cell: [phone]      Married   Investment banker, corporate education   2 children    Caffeine- 1-2 cups of coke daily    Current Meds  Medication Sig  . buPROPion (WELLBUTRIN XL) 300 MG 24 hr tablet Take 1 tablet (300 mg total) by mouth daily.  Marland Kitchen gabapentin (NEURONTIN) 100 MG capsule TAKE 1 CAPSULE(100 MG) BY MOUTH THREE TIMES DAILY  . ibuprofen (ADVIL,MOTRIN) 200 MG tablet Take 600-800 mg by mouth every 6 (six) hours as needed for headache.  . levonorgestrel (MIRENA) 20 MCG/24HR IUD 1 each by Intrauterine route once.  Marland Kitchen PARoxetine (PAXIL) 10 MG tablet TAKE 1 TABLET(10 MG) BY MOUTH DAILY  . raloxifene (EVISTA) 60 MG tablet TAKE 1 TABLET(60 MG) BY MOUTH DAILY  . traMADol (ULTRAM) 50 MG tablet Take 1 tablet (50 mg total) by mouth 3 (three) times daily as needed.     ROS:  Review of Systems  Constitutional: Negative for fatigue, fever and unexpected weight change.  Respiratory: Positive for shortness of breath. Negative for cough and wheezing.   Cardiovascular: Negative for chest pain, palpitations and  leg swelling.  Gastrointestinal: Negative for blood in stool, constipation, diarrhea, nausea and vomiting.  Endocrine: Positive for heat intolerance. Negative for cold intolerance and polyuria.  Genitourinary: Negative for dyspareunia, dysuria, flank pain, frequency, genital sores,  hematuria, menstrual problem, pelvic pain, urgency, vaginal bleeding, vaginal discharge and vaginal pain.  Musculoskeletal: Negative for back pain, joint swelling and myalgias.  Skin: Negative for rash.  Neurological: Positive for headaches. Negative for dizziness, syncope, light-headedness and numbness.  Hematological: Negative for adenopathy.  Psychiatric/Behavioral: Positive for agitation and dysphoric mood. Negative for confusion, sleep disturbance and suicidal ideas. The patient is not nervous/anxious.      Objective: BP 120/80   Ht 5' 2"  (1.575 m)   Wt 222 lb (100.7 kg)   BMI 40.60 kg/m    Physical Exam Constitutional:      Appearance: She is well-developed.  Genitourinary:     Vulva, vagina, uterus, right adnexa and left adnexa normal.     No vulval lesion or tenderness noted.     No vaginal discharge, erythema or tenderness.     No cervical motion tenderness or polyp.     IUD strings visualized.     Uterus is not enlarged or tender.     No right or left adnexal mass present.     Right adnexa not tender.     Left adnexa not tender.  Neck:     Musculoskeletal: Normal range of motion.     Thyroid: No thyromegaly.  Cardiovascular:     Rate and Rhythm: Normal rate and regular rhythm.     Heart sounds: Normal heart sounds. No murmur.  Pulmonary:     Effort: Pulmonary effort is normal.     Breath sounds: Normal breath sounds.  Chest:     Breasts:        Right: No mass, nipple discharge, skin change or tenderness.        Left: No mass, nipple discharge, skin change or tenderness.  Abdominal:     Palpations: Abdomen is soft.     Tenderness: There is no abdominal tenderness. There is no  guarding.  Musculoskeletal: Normal range of motion.  Neurological:     General: No focal deficit present.     Mental Status: She is alert and oriented to person, place, and time.     Cranial Nerves: No cranial nerve deficit.  Skin:    General: Skin is warm and dry.  Psychiatric:        Mood and Affect: Mood normal.        Behavior: Behavior normal.        Thought Content: Thought content normal.        Judgment: Judgment normal.  Vitals signs reviewed.     Assessment/Plan: Encounter for annual routine gynecological examination -   Screening for breast cancer - Plan: Pt had mammo today.   Encounter for routine checking of intrauterine contraceptive device (IUD) - Plan: IUD removal due 2/21. Pt to consider replacement and f/u prn.  Lobular carcinoma in situ (LCIS) of right breast - Plan: [facility] ([place]), On evista, followed by Dr. [name]  Family history of ovarian cancer - Plan: [facility] ([place]), [facility] testing discussed and done today. Will call with results.   GYN counsel breast self exam, mammography screening, adequate intake of calcium and vitamin D, diet and exercise     F/U  Return in about 1 year (around 02/14/2020).   B. , PA-C 02/14/2019 2:08 PM

## 2019-02-23 DIAGNOSIS — Z9189 Other specified personal risk factors, not elsewhere classified: Secondary | ICD-10-CM

## 2019-02-23 DIAGNOSIS — Z1371 Encounter for nonprocreative screening for genetic disease carrier status: Secondary | ICD-10-CM

## 2019-02-23 HISTORY — DX: Other specified personal risk factors, not elsewhere classified: Z91.89

## 2019-02-23 HISTORY — DX: Encounter for nonprocreative screening for genetic disease carrier status: Z13.71

## 2019-03-02 ENCOUNTER — Other Ambulatory Visit: Payer: Self-pay

## 2019-03-02 ENCOUNTER — Encounter: Payer: Self-pay | Admitting: Internal Medicine

## 2019-03-02 ENCOUNTER — Ambulatory Visit (INDEPENDENT_AMBULATORY_CARE_PROVIDER_SITE_OTHER): Payer: BC Managed Care – PPO | Admitting: Internal Medicine

## 2019-03-02 DIAGNOSIS — F32A Depression, unspecified: Secondary | ICD-10-CM

## 2019-03-02 DIAGNOSIS — M5387 Other specified dorsopathies, lumbosacral region: Secondary | ICD-10-CM

## 2019-03-02 DIAGNOSIS — E669 Obesity, unspecified: Secondary | ICD-10-CM | POA: Diagnosis not present

## 2019-03-02 DIAGNOSIS — E66811 Obesity, class 1: Secondary | ICD-10-CM

## 2019-03-02 DIAGNOSIS — F419 Anxiety disorder, unspecified: Secondary | ICD-10-CM

## 2019-03-02 DIAGNOSIS — R0683 Snoring: Secondary | ICD-10-CM | POA: Diagnosis not present

## 2019-03-02 DIAGNOSIS — F329 Major depressive disorder, single episode, unspecified: Secondary | ICD-10-CM

## 2019-03-02 MED ORDER — PAROXETINE HCL 10 MG PO TABS: ORAL_TABLET | ORAL | 1 refills | Status: DC

## 2019-03-02 NOTE — Progress Notes (Signed)
Virtual Visit converted to Telephone Note  This visit type was conducted due to national recommendations for restrictions regarding the COVID-19 pandemic (e.g. social distancing).  This format is felt to be most appropriate for this patient at this time.  All issues noted in this document were discussed and addressed.  No physical exam was performed (except for noted visual exam findings with Video Visits).   I connected with@ on 03/02/19 at 10:00 AM EDT by a video enabled telemedicine application, however  Interactive audio and video telecommunications  Ultimately failed, due to patient having technical difficulties.   We continued and completed visit with audio only. I and verified that I am speaking with the correct person using two identifiers.  Location patient: home Location provider: work or home office Persons participating in the virtual visit: patient, provider  I discussed the limitations, risks, security and privacy concerns of performing an evaluation and management service by telephone and the availability of in person appointments. I also discussed with the patient that there may be a patient responsible charge related to this service. The patient expressed understanding and agreed to proceed.  Reason for visit: follow up on obesity,  Abdominal distension  HPI:  48 yr old Weeks with history of obesity since adolescence presents for follow up on weight loss .  She is not  Exercising due to persistent back pain . She is frustrated with inability to lose weight,  Gets short of breath with walking.  Long history of obesity with prior success 20 yrs ago with phentermine and bariatric clinic/nutritional dressing.  Her husband reports loud snoring ,  She wakes up tired every morning.  Has  hypersomnia during the day.  .      with family history of ovarian CA  Personal history of LCIS right breast,  On Evista,  Now with maternal  history of BRCA, Referred for genetic testing,  Had this  done June 22   abd distension:  Never had the pelvic US that was ordered. .  CA 125 was normal. Had gyn eval in June,  Did not mention symptom.   ROS: See pertinent positives and negatives per HPI.   Past Medical History:  Diagnosis Date  . Depression   . Frequent headaches   . Head cold   . Headache(784.0)   . History of frequent urinary tract infections   . History of kidney stones   . Kidney stones   . Left ureteral calculus   . 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 [name]  . Lobular carcinoma in situ     Past Surgical History:  Procedure Laterality Date  . BREAST BIOPSY Right 02/02/2013   [place] bx/clip-neg  . BREAST BIOPSY Right 02/19/2017   affirm, ATYPICAL LOBULAR HYPERPLASIA  . BREAST BIOPSY Right 03/04/2017   Procedure: BREAST BIOPSY WITH NEEDLE LOCALIZATION;  Surgeon: [name], MD;  Location: [facility] OR[facility];  Service: General;  Laterality: Right;  . BREAST LUMPECTOMY Right 2018  . CESAREAN SECTION  2005  . CHOLECYSTECTOMY  09/2012  . CYSTOSCOPY W/ RETROGRADES N/A 02/02/2017   Procedure: CYSTOSCOPY WITH RETROGRADE PYELOGRAM;  Surgeon: [name], MD;  Location: [facility] OR[facility];  Service: Urology;  Laterality: N/A;  . EXTRACORPOREAL SHOCK WAVE LITHOTRIPSY  2009  . INTRAUTERINE DEVICE INSERTION  MARCH 2012   MURINA  . [place]  (APPROX)   Kidney Stone  . URETEROSCOPY WITH HOLMIUM LASER LITHOTRIPSY Left 02/02/2017   Procedure: URETEROSCOPY WITH HOLMIUM LASER LITHOTRIPSY;  Surgeon: [name], MD;  Location: [facility];  Service: Urology;  Laterality: Left;    Family History  Problem Relation Age of Onset  . Lung cancer Father   . Other Mother        breast cyst  . Breast cancer Mother 79       tested  . Alcohol abuse Maternal Uncle   . Alcohol abuse Paternal Uncle   . Ovarian cancer Maternal Grandmother 76  . Lymphoma Maternal Grandfather        cancer  . Breast cancer Maternal Aunt [address] late 54s  . Alcohol abuse  Paternal Grandfather     SOCIAL HX:  reports that she quit smoking about 4 years ago. Her smoking use included cigarettes. She has a 8.00 pack-year smoking history. She has never used smokeless tobacco. She reports current alcohol use. She reports that she does not use drugs.   Current Outpatient Medications:  .  buPROPion (WELLBUTRIN XL) 300 MG 24 hr tablet, Take 1 tablet (300 mg total) by mouth daily., Disp: 90 tablet, Rfl: 1 .  gabapentin (NEURONTIN) 100 MG capsule, TAKE 1 CAPSULE(100 MG) BY MOUTH THREE TIMES DAILY, Disp: 90 capsule, Rfl: 3 .  ibuprofen (ADVIL,MOTRIN) 200 MG tablet, Take 600-800 mg by mouth every 6 (six) hours as needed for headache., Disp: , Rfl:  .  levonorgestrel (MIRENA) 20 MCG/24HR IUD, 1 each by Intrauterine route once., Disp: , Rfl:  .  PARoxetine (PAXIL) 10 MG tablet, TAKE 1 TABLET(10 MG) BY MOUTH DAILY, Disp: 90 tablet, Rfl: 1 .  raloxifene (EVISTA) 60 MG tablet, TAKE 1 TABLET(60 MG) BY MOUTH DAILY, Disp: 30 tablet, Rfl: 12 .  traMADol (ULTRAM) 50 MG tablet, Take 1 tablet (50 mg total) by mouth 3 (three) times daily as needed., Disp: 90 tablet, Rfl: 1  EXAM:  VITALS per patient if applicable:  GENERAL: alert, oriented, appears well and in no acute distress  HEENT: atraumatic, conjunttiva clear, no obvious abnormalities on inspection of external nose and ears  NECK: normal movements of the head and neck  LUNGS: on inspection no signs of respiratory distress, breathing rate appears normal, no obvious gross SOB, gasping or wheezing  CV: no obvious cyanosis  MS: moves all visible extremities without noticeable abnormality  PSYCH/NEURO: anxious, responsive, speech and thought processing grossly intact  ASSESSMENT AND PLAN:  Obesity (BMI 30.0-34.9) She has ben unable to lose weight and actually gained weight on  Saxenda.  She is not exercising due to shortness of breath and fatigue.  She is frustrated and desparately  "tired of dieting"and is considering the  bariatric sleeve procedure to control her appetite and has an appointment with the bariatric clinic.    Loud snoring With hypersomnolence and obesity as concurrent RF for OSA.  Sleep study needed.   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  Tramadol refilled for management of low back pain   Anxiety and depression Adding paroxetine to wellbutrin for management of anxiety . Starting dose 10 mg     I discussed the assessment and treatment plan with the patient. The patient was provided an opportunity to ask questions and all were answered. The patient agreed with the plan and demonstrated an understanding of the instructions.   The patient was advised to call back or seek an in-person evaluation if the symptoms worsen or if the condition fails to improve as anticipated.  I provided  25 minutes of non-face-to-face time during this encounter.   [name], MD

## 2019-03-05 DIAGNOSIS — R0683 Snoring: Secondary | ICD-10-CM | POA: Insufficient documentation

## 2019-03-05 NOTE — Assessment & Plan Note (Signed)
Radiculopathy improved after second ESI.  Tramadol refilled for management of low back pain  

## 2019-03-05 NOTE — Assessment & Plan Note (Signed)
With hypersomnolence and obesity as concurrent RF for OSA.  Sleep study needed.

## 2019-03-05 NOTE — Assessment & Plan Note (Signed)
Adding paroxetine to wellbutrin for management of anxiety . Starting dose 10 mg

## 2019-03-05 NOTE — Assessment & Plan Note (Signed)
She has ben unable to lose weight and actually gained weight on  Saxenda.  She is not exercising due to shortness of breath and fatigue.  She is frustrated and desparately  "tired of dieting"and is considering the bariatric sleeve procedure to control her appetite and has an appointment with the bariatric clinic.

## 2019-03-08 ENCOUNTER — Encounter: Payer: Self-pay | Admitting: Obstetrics and Gynecology

## 2019-03-10 ENCOUNTER — Ambulatory Visit: Payer: BC Managed Care – PPO | Admitting: General Surgery

## 2019-03-15 ENCOUNTER — Encounter: Payer: Self-pay | Admitting: Internal Medicine

## 2019-03-15 LAB — PULMONARY FUNCTION TEST

## 2019-03-16 ENCOUNTER — Encounter: Payer: Self-pay | Admitting: Internal Medicine

## 2019-03-23 ENCOUNTER — Telehealth: Payer: Self-pay

## 2019-03-23 NOTE — Telephone Encounter (Signed)
I have not seen these have you?

## 2019-03-23 NOTE — Telephone Encounter (Signed)
No not yet

## 2019-03-23 NOTE — Telephone Encounter (Signed)
LMTCB to let pt know that we have not received her sleep study results yet.

## 2019-03-23 NOTE — Telephone Encounter (Signed)
Copied from [place] [id number]. Topic: General - Inquiry >> Mar 23, 2019  9:23 AM Reyne Dumas L wrote: Reason for CRM:   Pt wants to know if Dr. Derrel Nip has the results of her at home sleep study done last week.

## 2019-03-24 ENCOUNTER — Ambulatory Visit: Payer: BC Managed Care – PPO | Admitting: General Surgery

## 2019-03-24 NOTE — Telephone Encounter (Signed)
Pt called back and aware results of sleep study not back yet.

## 2019-03-30 ENCOUNTER — Telehealth: Payer: Self-pay

## 2019-03-30 DIAGNOSIS — G4733 Obstructive sleep apnea (adult) (pediatric): Secondary | ICD-10-CM

## 2019-03-30 DIAGNOSIS — R0683 Snoring: Secondary | ICD-10-CM

## 2019-03-30 NOTE — Telephone Encounter (Signed)
Copied from [place] [id number]. Topic: General - Inquiry >> Mar 30, 2019 11:05 AM Ariana Weeks wrote: Reason for CRM:  Pt calling to see if at home sleep study results are back and if there is any information that she can have on this.  Pt states she did the sleep study two weeks ago. Pt can be reached at [phone]

## 2019-03-30 NOTE — Telephone Encounter (Signed)
Spoke with pt and informed her that we still have not received her sleep study gave the pt the number the reach the company that did it. Pt gave a verbal understanding.

## 2019-03-31 NOTE — Telephone Encounter (Signed)
Pt states number given  yesterday is a fax number, is there another number pls FU pt [phone]  This was concerning trying to get results from sleep study

## 2019-04-01 ENCOUNTER — Ambulatory Visit: Payer: BC Managed Care – PPO | Admitting: Surgery

## 2019-04-06 ENCOUNTER — Other Ambulatory Visit (HOSPITAL_COMMUNITY): Payer: Self-pay | Admitting: Surgery

## 2019-04-06 ENCOUNTER — Other Ambulatory Visit: Payer: Self-pay | Admitting: Surgery

## 2019-04-07 ENCOUNTER — Other Ambulatory Visit: Payer: Self-pay | Admitting: Surgery

## 2019-04-07 NOTE — Telephone Encounter (Signed)
LMTCB

## 2019-04-11 NOTE — Telephone Encounter (Signed)
Sleep study has been placed in yellow folder.

## 2019-04-12 NOTE — Telephone Encounter (Signed)
Her sleep study has been reviewed and suggests that she has mild OSA  And a lot of snoring  A CPAP titration study is warranted if she has documented excessive daytime sleepiness or  Insomnia,.  Ok to send results to bariatric surgeon as requested

## 2019-04-12 NOTE — Assessment & Plan Note (Signed)
Her sleep study has been reviewed and suggests that she has mild OSA  And a lot of snoring  A CPAP titration study is warranted if she has documented excessive daytime sleepiness or  Insomnia,.  Ok to send results to bariatric surgeon as requested  

## 2019-04-13 ENCOUNTER — Ambulatory Visit
Admission: RE | Admit: 2019-04-13 | Discharge: 2019-04-13 | Disposition: A | Discharge status: Home / Self Care | Payer: BC Managed Care – PPO | Source: Ambulatory Visit | Attending: Surgery | Admitting: Surgery

## 2019-04-13 ENCOUNTER — Other Ambulatory Visit: Payer: Self-pay

## 2019-04-13 NOTE — Telephone Encounter (Signed)
Spoke with pt to inform her of her sleep study results. Pt stated that she does have excessive daytime sleepiness and she stated that she wakes a lot during the night.

## 2019-04-13 NOTE — Addendum Note (Signed)
Addended by: [name] on: 04/13/2019 12:19 PM   Modules accepted: Orders

## 2019-04-13 NOTE — Telephone Encounter (Signed)
CPAP titration study needed .  Recent positive home sleep study  Sym[otms  Loud frequent snoring  Daytime hypersomnia,  Insomnia

## 2019-04-28 ENCOUNTER — Encounter: Payer: Self-pay | Admitting: Obstetrics and Gynecology

## 2019-04-28 ENCOUNTER — Telehealth: Payer: Self-pay | Admitting: Obstetrics and Gynecology

## 2019-04-28 NOTE — Telephone Encounter (Signed)
LMTRC

## 2019-04-28 NOTE — Telephone Encounter (Signed)
Pt aware of neg MyRisk results except AXIN2 VUS. IBIS=66% since pt has a hx of lobular CIS, being followed by Dr. [name] and treated with evista. Recommend monthly SBE, yearly CBE and mammos and yearly scr breast MRI. Pt would feel more comfortable with addl imaging and plans to f/u with ins co re: coverage for MRI. MRI due before 08/16/19. Pt to call 10/20 to sched if desires.   Pt also understands negative genetic testing doesn't mean she will never get any of these cancers.   Hard copy mailed to pt. F/u prn.

## 2019-04-28 NOTE — Telephone Encounter (Signed)
Pt called back returning your phone call  

## 2019-04-28 NOTE — Telephone Encounter (Signed)
Patient is calling to speak with ABC. 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 Thank you!

## 2019-05-09 ENCOUNTER — Ambulatory Visit: Payer: Self-pay

## 2019-05-09 NOTE — Telephone Encounter (Signed)
Please set her up asap (this week)  for follow up new onset hypertension

## 2019-05-09 NOTE — Telephone Encounter (Signed)
Patient was advised to go to Urgent Care.

## 2019-05-09 NOTE — Telephone Encounter (Signed)
Incoming call from Patient who Reports that her Blood/Pressure is 170/110 twice  at work.Blood Pressure was taken  Manually .   Around 3:15pm  Patient is not taken any medication. Patient  Reports  Blurred vision .  Patient Reports headaches and blurred vision.   Protocol recommends that she be evaluated at Urgent Care or ED.  Patient does not wish to go there. Want to make an appointment  for Dr office.  Called office office recommend she be   Evaluated at Urgent Care also.  Related Info to Patient.        Reason for Disposition . [5] Systolic BP  >= 027 OR Diastolic >= 741 AND [2] cardiac or neurologic symptoms (e.g., chest pain, difficulty breathing, unsteady gait, blurred vision)  Answer Assessment - Initial Assessment Questions 1. BLOOD PRESSURE: "What is the blood pressure?" "Did you take at least two measurements 5 minutes apart?"  170/ 110  170/100 2. ONSET: "When did you take your blood pressure?"     3:15  3:40 3. HOW: "How did you obtain the blood pressure?" (e.g., visiting nurse, automatic home BP monitor)    maunual  4. HISTORY: "Do you have a history of high blood pressure?"    No first time 5. MEDICATIONS: "Are you taking any medications for blood pressure?" "Have you missed any doses recently?"    No  6. OTHER SYMPTOMS: "Do you have any symptoms?" (e.g., headache, chest pain, blurred vision, difficulty breathing, weakness)      Blurred vision  7. PREGNANCY: "Is there any chance you are pregnant?" "When was your last menstrual period?"     Iud  Protocols used: HIGH BLOOD PRESSURE-A-AH

## 2019-05-10 ENCOUNTER — Ambulatory Visit: Payer: BC Managed Care – PPO | Admitting: Internal Medicine

## 2019-05-10 NOTE — Telephone Encounter (Signed)
LMTCB

## 2019-05-10 NOTE — Telephone Encounter (Signed)
Spoke with pt and scheduled her for a virtual visit tomorrow at 8:30am. Pt is aware off appt date and time.

## 2019-05-11 ENCOUNTER — Ambulatory Visit: Payer: BC Managed Care – PPO | Admitting: Dietician

## 2019-05-11 ENCOUNTER — Ambulatory Visit (INDEPENDENT_AMBULATORY_CARE_PROVIDER_SITE_OTHER): Payer: BC Managed Care – PPO | Admitting: Internal Medicine

## 2019-05-11 ENCOUNTER — Other Ambulatory Visit: Payer: Self-pay | Admitting: *Deleted

## 2019-05-11 ENCOUNTER — Encounter: Payer: Self-pay | Admitting: Internal Medicine

## 2019-05-11 VITALS — Ht 62.0 in | Wt 222.0 lb

## 2019-05-11 DIAGNOSIS — B349 Viral infection, unspecified: Secondary | ICD-10-CM | POA: Diagnosis not present

## 2019-05-11 DIAGNOSIS — I10 Essential (primary) hypertension: Secondary | ICD-10-CM | POA: Diagnosis not present

## 2019-05-11 DIAGNOSIS — R51 Headache: Secondary | ICD-10-CM | POA: Diagnosis not present

## 2019-05-11 DIAGNOSIS — Z20828 Contact with and (suspected) exposure to other viral communicable diseases: Secondary | ICD-10-CM

## 2019-05-11 DIAGNOSIS — R519 Headache, unspecified: Secondary | ICD-10-CM

## 2019-05-11 DIAGNOSIS — Z20822 Contact with and (suspected) exposure to covid-19: Secondary | ICD-10-CM

## 2019-05-11 MED ORDER — METOPROLOL SUCCINATE ER 25 MG PO TB24: 25.0000 mg | ORAL_TABLET | Freq: Every day | ORAL | 3 refills | Status: DC

## 2019-05-11 NOTE — Progress Notes (Signed)
Virtual Visit via Virtual  Note  This visit type was conducted due to national recommendations for restrictions regarding the COVID-19 pandemic (e.g. social distancing).  This format is felt to be most appropriate for this patient at this time.  All issues noted in this document were discussed and addressed.  No physical exam was performed (except for noted visual exam findings with Video Visits).   I connected with@ on 05/11/19 at  8:30 AM EDT by a video enabled telemedicine application or telephone and verified that I am speaking with the correct person using two identifiers. Location patient: home Location provider: work or home office Persons participating in the virtual visit: patient, provider  I discussed the limitations, risks, security and privacy concerns of performing an evaluation and management service by telephone and the availability of in person appointments. I also discussed with the patient that there may be a patient responsible charge related to this service. The patient expressed understanding and agreed to proceed.  Reason for visit: new onset persistent headache  And new onset elevated blood pressure   HPI:   48 yr old female with history of OSA ,  Obesity and sciatica presents with one week history of moderately severe headache accompanied by elevated blood pressure . Headache started last Friday  Sept 11 location frontal to occiput , bilateral .  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  The headache has persisted for days, and she stayed home today  She has developed subjective fevers and chills on sept 15.  Works at an [employer] so exposure to [place] UNLIKELY   Recently had an [place] on Sept 2 of L1 . Denies back pain currently.   As mild OSA  By recent sleep study , looking a less expensive avenue to obtaining CPAP    ROS: See pertinent positives and negatives per HPI.  Past Medical History:   Diagnosis Date  . BRCA negative 02/2019   MyRisk neg except AXIN2 VUS  . Depression   . Frequent headaches   . Head cold   . Headache(784.0)   . History of frequent urinary tract infections   . History of kidney stones   . Increased risk of breast cancer 02/2019   IBIS=66% due to hx of lobular CIS  . Kidney stones   . Left ureteral calculus   . 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 [name]    Past Surgical History:  Procedure Laterality Date  . BREAST BIOPSY Right 02/02/2013   [place] bx/clip-neg  . BREAST BIOPSY Right 02/19/2017   affirm, ATYPICAL LOBULAR HYPERPLASIA  . BREAST BIOPSY Right 03/04/2017   Procedure: BREAST BIOPSY WITH NEEDLE LOCALIZATION;  Surgeon: [name], MD;  Location: [facility] OR[facility];  Service: General;  Laterality: Right;  . BREAST LUMPECTOMY Right 2018  . CESAREAN SECTION  2005  . CHOLECYSTECTOMY  09/2012  . CYSTOSCOPY W/ RETROGRADES N/A 02/02/2017   Procedure: CYSTOSCOPY WITH RETROGRADE PYELOGRAM;  Surgeon: [name], MD;  Location: [facility] OR[facility];  Service: Urology;  Laterality: N/A;  . EXTRACORPOREAL SHOCK WAVE LITHOTRIPSY  2009  . INTRAUTERINE DEVICE INSERTION  MARCH 2012   MURINA  . [place]  (APPROX)   Kidney Stone  . URETEROSCOPY WITH HOLMIUM LASER LITHOTRIPSY Left 02/02/2017   Procedure: URETEROSCOPY WITH HOLMIUM LASER LITHOTRIPSY;  Surgeon: [name], MD;  Location: [facility];  Service: Urology;  Laterality: Left;  Family History  Problem Relation Age of Onset  . Lung cancer Father   . Other Mother        breast cyst  . Breast cancer Mother 64       tested  . Alcohol abuse Maternal Uncle   . Alcohol abuse Paternal Uncle   . Ovarian cancer Maternal Grandmother 76  . Lymphoma Maternal Grandfather        cancer  . Breast cancer Maternal Aunt [address], late 92s  . Alcohol abuse Paternal Grandfather     SOCIAL HX:  reports that she quit smoking about 4 years ago. Her smoking use included cigarettes.  She has a 8.00 pack-year smoking history. She has never used smokeless tobacco. She reports current alcohol use. She reports that she does not use drugs.   Current Outpatient Medications:  .  buPROPion (WELLBUTRIN XL) 300 MG 24 hr tablet, Take 1 tablet (300 mg total) by mouth daily., Disp: 90 tablet, Rfl: 1 .  gabapentin (NEURONTIN) 100 MG capsule, TAKE 1 CAPSULE(100 MG) BY MOUTH THREE TIMES DAILY, Disp: 90 capsule, Rfl: 3 .  ibuprofen (ADVIL,MOTRIN) 200 MG tablet, Take 600-800 mg by mouth every 6 (six) hours as needed for headache., Disp: , Rfl:  .  levonorgestrel (MIRENA) 20 MCG/24HR IUD, 1 each by Intrauterine route once., Disp: , Rfl:  .  PARoxetine (PAXIL) 10 MG tablet, TAKE 1 TABLET(10 MG) BY MOUTH DAILY, Disp: 90 tablet, Rfl: 1 .  raloxifene (EVISTA) 60 MG tablet, TAKE 1 TABLET(60 MG) BY MOUTH DAILY, Disp: 30 tablet, Rfl: 12 .  traMADol (ULTRAM) 50 MG tablet, Take 1 tablet (50 mg total) by mouth 3 (three) times daily as needed., Disp: 90 tablet, Rfl: 1 .  metoprolol succinate (TOPROL-XL) 25 MG 24 hr tablet, Take 1 tablet (25 mg total) by mouth daily., Disp: 90 tablet, Rfl: 3  EXAM:  VITALS per patient if applicable:  GENERAL: alert, oriented, appears well and in no acute distress  HEENT: atraumatic, conjunttiva clear, no obvious abnormalities on inspection of external nose and ears  NECK: normal movements of the head and neck  LUNGS: on inspection no signs of respiratory distress, breathing rate appears normal, no obvious gross SOB, gasping or wheezing  CV: no obvious cyanosis  MS: moves all visible extremities without noticeable abnormality  PSYCH/NEURO: pleasant and cooperative, no obvious depression or anxiety, speech and thought processing grossly intact  ASSESSMENT AND PLAN:  Discussed the following assessment and plan:  Suspected Covid-19 Virus Infection - Plan: CANCELED: Novel Coronavirus, NAA ([facility])  Headache due to viral infection  Hypertension, unspecified  type  Headache due to viral infection PRESUMED,  Vs due to elevated BP,  Untreated OSA. SENT FOR COVID due to fever and chills and likelihood of exposure.  Given advice to remain out of school for 10 days from day of onset (Sept 11) and afebrile for 3.  Given tramadol for headache   Hypertension Recommend trial of Toprol XL starting at 25 mg daily given her history of frequent headaches .  Reassess after one week for dose adjustment      I discussed the assessment and treatment plan with the patient. The patient was provided an opportunity to ask questions and all were answered. The patient agreed with the plan and demonstrated an understanding of the instructions.   The patient was advised to call back or seek an in-person evaluation if the symptoms worsen or if the condition fails to improve  as anticipated.  I provided  25 minutes of non-face-to-face time during this encounter reviewing patient's current problems and post surgeries.  Providing counseling on the above mentioned problems , and coordination  of care .  [name], MD

## 2019-05-11 NOTE — Patient Instructions (Addendum)
  If Your covid 19 test results are  negative, because you are symptomatic and may have had a positive exposure ,   You should consider the results to be a false negative ,  continue isolation for a minimum of 10 days and must be fever free without the use of tylenol, aleve or motrin for 3 days before returning to work .Plan to be out of work until Sept 22 (10 days frm date on onset of symptoms)  My work note reflects this advice.   Metoprolol xl once tablet daily in the evening for blood pressure.  incresae dose to 50 mg after 4 days in not < 130/80.  Buy a blood pressure machine  or borrow one  Use tramadol 1-2 every 6 to 8 hours as needed for headache.  NO TYLENOL OR IBUPROFEN BECAUSE THEY CAN MASK A FEVER.  If you think you will run out before Monday and you do not have a refill,  Call me ASAP   Check with your insurance about other carriers they may cover better than April for CPAP supplies Maricela Curet

## 2019-05-11 NOTE — Progress Notes (Signed)
Has been having headaches since last Friday. 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 That evening blood pressure 160/90. Yesterday afternoon blood pressure was 140/100 after sleeping and resting all day. Pt still has headache this morning. Not able to check blood pressure at home, she has a nurese friend that has been checking it for her.

## 2019-05-12 ENCOUNTER — Telehealth: Payer: Self-pay

## 2019-05-12 DIAGNOSIS — I10 Essential (primary) hypertension: Secondary | ICD-10-CM | POA: Insufficient documentation

## 2019-05-12 DIAGNOSIS — G4452 New daily persistent headache (NDPH): Secondary | ICD-10-CM | POA: Insufficient documentation

## 2019-05-12 LAB — NOVEL CORONAVIRUS, NAA: SARS-CoV-2, NAA: NOT DETECTED

## 2019-05-12 NOTE — Telephone Encounter (Signed)
Message left for the patient. We are able to only give one refill on her Raloxifene 60 mg as she will need to be seen in office by Dr Dahlia Byes for a follow up and further refills. She was last seen in office on 02/09/18.  I have left a message for her to call us to schedule a follow up appointment with Dr Dahlia Byes.

## 2019-05-12 NOTE — Assessment & Plan Note (Addendum)
Recommend trial of Toprol XL starting at 25 mg daily given her history of frequent headaches .  Reassess after one week for dose adjustment

## 2019-05-12 NOTE — Assessment & Plan Note (Signed)
PRESUMED,  Vs due to elevated BP,  Untreated OSA. SENT FOR COVID due to fever and chills and likelihood of exposure.  Given advice to remain out of school for 10 days from day of onset (Sept 11) and afebrile for 3.  Given tramadol for headache

## 2019-05-18 ENCOUNTER — Other Ambulatory Visit: Payer: Self-pay | Admitting: *Deleted

## 2019-05-18 ENCOUNTER — Telehealth: Payer: Self-pay | Admitting: Internal Medicine

## 2019-05-18 DIAGNOSIS — Z20828 Contact with and (suspected) exposure to other viral communicable diseases: Secondary | ICD-10-CM

## 2019-05-18 DIAGNOSIS — R509 Fever, unspecified: Secondary | ICD-10-CM | POA: Insufficient documentation

## 2019-05-18 DIAGNOSIS — Z20822 Contact with and (suspected) exposure to covid-19: Secondary | ICD-10-CM

## 2019-05-18 NOTE — Telephone Encounter (Signed)
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 Then around midnight last night it was 102.7.  But now this morning she has no fever, her head hurts and she is very cold. (temp 97.7)  Pt wants to know what the dr would advise her to do.  She tested negative last week for covid.

## 2019-05-18 NOTE — Telephone Encounter (Signed)
Called and left message on cell number for patient (ok per DPR) informing patient that work note has been sent to [employer].

## 2019-05-18 NOTE — Telephone Encounter (Signed)
She should be tested again.  Regardless of the outcome of the test , she should presume she has COVID

## 2019-05-18 NOTE — Telephone Encounter (Signed)
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

## 2019-05-18 NOTE — Telephone Encounter (Signed)
Patient is agreeable to go get tested for COVID again today.  Patient wants to go on her lunch break which is from 12-1 pm.  Please place order.  Patient said that she is working from home and request a work note to continue to work from home when she feels up to it until test results are confirmed.

## 2019-05-18 NOTE — Telephone Encounter (Signed)
Offered patient a virtual visit w/ Dr. Derrel Nip at 4:30 pm today.  Patient said she is not sure she needs to scheduled a virtual visit.  Patient requested notes be sent over for Dr. Derrel Nip to review and to be advised.

## 2019-05-19 LAB — NOVEL CORONAVIRUS, NAA: SARS-CoV-2, NAA: NOT DETECTED

## 2019-05-20 ENCOUNTER — Ambulatory Visit: Payer: Self-pay | Admitting: *Deleted

## 2019-05-20 MED ORDER — PREDNISONE 10 MG PO TABS: ORAL_TABLET | ORAL | 0 refills | Status: DC

## 2019-05-20 MED ORDER — CYCLOBENZAPRINE HCL 10 MG PO TABS: 10.0000 mg | ORAL_TABLET | Freq: Three times a day (TID) | ORAL | 0 refills | Status: DC | PRN

## 2019-05-20 NOTE — Telephone Encounter (Signed)
She can also add tramadol ever y 6 hours as needed for pain.  Does she have any left from the June rx?

## 2019-05-20 NOTE — Addendum Note (Signed)
Addended by: [name] on: 05/20/2019 01:10 PM   Modules accepted: Orders

## 2019-05-20 NOTE — Telephone Encounter (Signed)
I returned pt's call.   She is c/o having a headache for 2 weeks now with a stiff neck and pain in her neck also.   The only medication that gets rid of the headache and pain is Excedrin Migraine. These headaches are so bad they are interfering with work [employer]).    She saw Dr. Derrel Nip 05/11/2019 virtually  for the headaches and elevated BP.   She was started on a new BP pill, metoprolol 25 mg. She started taking 2 BP pills because BP was staying elevated per Dr. Lupita Dawn instruction per pt.  Pt requested I send a note to Dr. Derrel Nip letting her know of the continued headaches since she just "saw" her virtually on 05/11/2019.  I sent my notes to Dr Lupita Dawn office.    Reason for Disposition . Headache is a chronic symptom (recurrent or ongoing AND present > 4 weeks)    A note being sent to Dr Derrel Nip since pt just did a virtual visit with her on 05/11/2019 for the same issue.  Answer Assessment - Initial Assessment Questions 1. LOCATION: "Where does it hurt?"      I've had a headache all week.    My BP was high 170/110..   I had a virtual visit Wed morning with Dr. Derrel Nip.    On Monday BP 124/82 at work. I've started taking 2 pills of the BP medication per  I'm having a headache that goes down into my neck.   When I bend over to do laundry I feel funny.   Yesterday I was fine until 5:00 yesterday I had a headache so bad I thought I would vomit. d   I've had a headache for 2 weeks.  I get shots in my back.    It's been about 6 weeks since my last shot.    2. ONSET: "When did the headache start?" (Minutes, hours or days)      2 weeks ago.     3. PATTERN: "Does the pain come and go, or has it been constant since it started?"     Constant 4. SEVERITY: "How bad is the pain?" and "What does it keep you from doing?"  (e.g., Scale 1-10; mild, moderate, or severe)   - MILD (1-3): doesn't interfere with normal activities    - MODERATE (4-7): interferes with normal activities or awakens from sleep    -  SEVERE (8-10): excruciating pain, unable to do any normal activities        Sometimes a 10  But it varies.    The stiffness/pain in my neck really bothers me.   I don't have any nasal congestion.   No migraine history. 5. RECURRENT SYMPTOM: "Have you ever had headaches before?" If so, ask: "When was the last time?" and "What happened that time?"      I am a regular headache person.   I can take ibuprofen or BC powders and it knocks it out but.   Excedrin migraine gets rid of it but I can't take that too much.     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   6. CAUSE: "What do you think is causing the headache?"     I've been having headaches for years. 7. MIGRAINE: "Have you been diagnosed with migraine headaches?" If so, ask: "Is this headache similar?"      No  8. HEAD INJURY: "Has there been any recent injury to the head?"      No  9. OTHER SYMPTOMS: "Do you have any other symptoms?" (fever, stiff neck, eye pain, sore throat, cold symptoms)     Stiff neck and pain.      I'm going to get an automatic BP machine today so I can monitor my BP. 10. PREGNANCY: "Is there any chance you are pregnant?" "When was your last menstrual period?"       Not asked  Protocols used: HEADACHE-A-AH

## 2019-05-20 NOTE — Telephone Encounter (Signed)
I think her headaches may be coming from disk disease in her spine.  I am sending a prednisone taper and an rx for a muscle relaxer to her pharmacy.  Start them today.  If the headache is not improved with prednisone and becomes so severe she   Rates it a ten she needs to go to the ER

## 2019-05-20 NOTE — Telephone Encounter (Signed)
Spoke with pt and informed her of Dr. Lupita Dawn message and that she would like for her to try taking the prednisone and the muscle relaxer. Told pt that if she has not gotten any relief by Monday to give [place] a call back or if the pain got severe she would need to go to the ER. Pt gave a verbal understanding.

## 2019-05-23 ENCOUNTER — Telehealth: Payer: Self-pay

## 2019-05-23 ENCOUNTER — Ambulatory Visit (INDEPENDENT_AMBULATORY_CARE_PROVIDER_SITE_OTHER): Payer: BC Managed Care – PPO | Admitting: Internal Medicine

## 2019-05-23 ENCOUNTER — Encounter: Payer: Self-pay | Admitting: Internal Medicine

## 2019-05-23 ENCOUNTER — Ambulatory Visit: Payer: Self-pay | Admitting: *Deleted

## 2019-05-23 VITALS — BP 174/114 | Ht 62.0 in | Wt 222.0 lb

## 2019-05-23 DIAGNOSIS — R0683 Snoring: Secondary | ICD-10-CM

## 2019-05-23 DIAGNOSIS — G4452 New daily persistent headache (NDPH): Secondary | ICD-10-CM | POA: Diagnosis not present

## 2019-05-23 DIAGNOSIS — I10 Essential (primary) hypertension: Secondary | ICD-10-CM | POA: Diagnosis not present

## 2019-05-23 DIAGNOSIS — R519 Headache, unspecified: Secondary | ICD-10-CM

## 2019-05-23 DIAGNOSIS — R51 Headache: Secondary | ICD-10-CM

## 2019-05-23 MED ORDER — HYDROCODONE-ACETAMINOPHEN 7.5-325 MG PO TABS: 1.0000 | ORAL_TABLET | Freq: Four times a day (QID) | ORAL | 0 refills | Status: DC | PRN

## 2019-05-23 MED ORDER — AMLODIPINE BESYLATE 5 MG PO TABS: 5.0000 mg | ORAL_TABLET | Freq: Every day | ORAL | 3 refills | Status: DC

## 2019-05-23 NOTE — Assessment & Plan Note (Signed)
Adding amlodipine 5 mg daily to metoprolol 50 mg daily

## 2019-05-23 NOTE — Telephone Encounter (Signed)
Sending to donna Per request

## 2019-05-23 NOTE — Assessment & Plan Note (Signed)
Her sleep study has been reviewed and suggests that she has mild OSA  And a lot of snoring  A CPAP titration study is warranted

## 2019-05-23 NOTE — Assessment & Plan Note (Addendum)
Initially considered to be secondary to viral infection but she has been COVID NEGATIVE X 2.  Likely multifactorial  With OSA,   Recent ESI , hypertension and anxiety contributing. Also need to rlue out cervical disk disease given its improvement with prednisone

## 2019-05-23 NOTE — Telephone Encounter (Signed)
yes

## 2019-05-23 NOTE — Progress Notes (Signed)
Virtual Visit via [email or web address]  This visit type was conducted due to national recommendations for restrictions regarding the COVID-19 pandemic (e.g. social distancing).  This format is felt to be most appropriate for this patient at this time.  All issues noted in this document were discussed and addressed.  No physical exam was performed (except for noted visual exam findings with Video Visits).   I connected with@ on 05/23/19 at 10:00 AM EDT by a video enabled telemedicine application and verified that I am speaking with the correct person using two identifiers. Location patient: home Location provider: work or home office Persons participating in the virtual visit: patient, provider  I discussed the limitations, risks, security and privacy concerns of performing an evaluation and management service by telephone and the availability of in person appointments. I also discussed with the patient that there may be a patient responsible charge related to this service. The patient expressed understanding and agreed to proceed.  Reason for visit: follow up on hypertension,  New daily headache,  And recently diagnosed OSA by home study   HPI:  48 yr old female with new onset hypertension,  Newly diagnosed OSA , and sciatica presents for follow up on hypertension and headache.   HTN:  She is currently taking 50 mg toprol XL but  her readings are still elevated. (284 to 132 systolic.  Diastolic in the 440 to 102) and pulse 75 .    New onset daily headache: Her severe headache has improved but not resolved after starting a prednisone  taper and she is currently on Day 5 .  She received a lumbar ESI on Sept 7 and was advised that she might have a HA for several days.   She had several days of fevers last week  , but has been COVID TESTED and NEGATIVE on both occasions.   OSA: home sleep study was positive foe OSA and she has been advised to scheude a CPAP titration study   ROS: See pertinent positives and  negatives per HPI.  Past Medical History:  Diagnosis Date  . BRCA negative 02/2019   MyRisk neg except AXIN2 VUS  . Depression   . Frequent headaches   . Head cold   . Headache(784.0)   . History of frequent urinary tract infections   . History of kidney stones   . Increased risk of breast cancer 02/2019   IBIS=66% due to hx of lobular CIS  . Kidney stones   . Left ureteral calculus   . 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 [name]    Past Surgical History:  Procedure Laterality Date  . BREAST BIOPSY Right 02/02/2013   [place] bx/clip-neg  . BREAST BIOPSY Right 02/19/2017   affirm, ATYPICAL LOBULAR HYPERPLASIA  . BREAST BIOPSY Right 03/04/2017   Procedure: BREAST BIOPSY WITH NEEDLE LOCALIZATION;  Surgeon: [name], MD;  Location: [facility] ORS;  Service: General;  Laterality: Right;  . BREAST LUMPECTOMY Right 2018  . CESAREAN SECTION  2005  . CHOLECYSTECTOMY  09/2012  . CYSTOSCOPY W/ RETROGRADES N/A 02/02/2017   Procedure: CYSTOSCOPY WITH RETROGRADE PYELOGRAM;  Surgeon: [name], MD;  Location: [facility] ORS;  Service: Urology;  Laterality: N/A;  . EXTRACORPOREAL SHOCK WAVE LITHOTRIPSY  2009  . INTRAUTERINE DEVICE INSERTION  MARCH 2012   MURINA  . [place]  (APPROX)   Kidney Stone  . URETEROSCOPY WITH HOLMIUM LASER LITHOTRIPSY Left 02/02/2017   Procedure: URETEROSCOPY WITH HOLMIUM LASER LITHOTRIPSY;  Surgeon:  [name], MD;  Location: [facility] ORS;  Service: Urology;  Laterality: Left;    Family History  Problem Relation Age of Onset  . Lung cancer Father   . Other Mother        breast cyst  . Breast cancer Mother 39       tested  . Alcohol abuse Maternal Uncle   . Alcohol abuse Paternal Uncle   . Ovarian cancer Maternal Grandmother 76  . Lymphoma Maternal Grandfather        cancer  . Breast cancer Maternal Aunt [address], late 58s  . Alcohol abuse Paternal Grandfather     SOCIAL HX:  reports that she quit smoking about 4 years  ago. Her smoking use included cigarettes. She has a 8.00 pack-year smoking history. She has never used smokeless tobacco. She reports current alcohol use. She reports that she does not use drugs.   Current Outpatient Medications:  .  buPROPion (WELLBUTRIN XL) 300 MG 24 hr tablet, Take 1 tablet (300 mg total) by mouth daily., Disp: 90 tablet, Rfl: 1 .  cyclobenzaprine (FLEXERIL) 10 MG tablet, Take 1 tablet (10 mg total) by mouth 3 (three) times daily as needed for muscle spasms., Disp: 30 tablet, Rfl: 0 .  gabapentin (NEURONTIN) 100 MG capsule, TAKE 1 CAPSULE(100 MG) BY MOUTH THREE TIMES DAILY, Disp: 90 capsule, Rfl: 3 .  ibuprofen (ADVIL,MOTRIN) 200 MG tablet, Take 600-800 mg by mouth every 6 (six) hours as needed for headache., Disp: , Rfl:  .  levonorgestrel (MIRENA) 20 MCG/24HR IUD, 1 each by Intrauterine route once., Disp: , Rfl:  .  metoprolol succinate (TOPROL-XL) 25 MG 24 hr tablet, Take 1 tablet (25 mg total) by mouth daily., Disp: 90 tablet, Rfl: 3 .  PARoxetine (PAXIL) 10 MG tablet, TAKE 1 TABLET(10 MG) BY MOUTH DAILY, Disp: 90 tablet, Rfl: 1 .  predniSONE (DELTASONE) 10 MG tablet, 6 tablets on Day 1 , then reduce by 1 tablet daily until gone, Disp: 21 tablet, Rfl: 0 .  raloxifene (EVISTA) 60 MG tablet, TAKE 1 TABLET(60 MG) BY MOUTH DAILY, Disp: 30 tablet, Rfl: 12 .  traMADol (ULTRAM) 50 MG tablet, Take 1 tablet (50 mg total) by mouth 3 (three) times daily as needed., Disp: 90 tablet, Rfl: 1 .  amLODipine (NORVASC) 5 MG tablet, Take 1 tablet (5 mg total) by mouth daily., Disp: 90 tablet, Rfl: 3 .  HYDROcodone-acetaminophen (NORCO) 7.5-325 MG tablet, Take 1 tablet by mouth every 6 (six) hours as needed for moderate pain or severe pain., Disp: 30 tablet, Rfl: 0  EXAM:  VITALS per patient if applicable:  GENERAL: alert, oriented, appears well and in no acute distress  HEENT: atraumatic, conjunttiva clear, no obvious abnormalities on inspection of external nose and ears  NECK: normal  movements of the head and neck  LUNGS: on inspection no signs of respiratory distress, breathing rate appears normal, no obvious gross SOB, gasping or wheezing  CV: no obvious cyanosis  MS: moves all visible extremities without noticeable abnormality  PSYCH/NEURO: pleasant and cooperative, no obvious depression or anxiety, speech and thought processing grossly intact  ASSESSMENT AND PLAN:  Discussed the following assessment and plan:  Headache in back of head - Plan: DG Cervical Spine Complete  Loud snoring  New persistent daily headache  Hypertension, unspecified type  Loud snoring Her sleep study has been reviewed and suggests that she has mild OSA  And a lot of snoring  A CPAP  titration study is warranted  New persistent daily headache Initially considered to be secondary to viral infection but she has been COVID NEGATIVE X 2.  Likely multifactorial  With OSA,   Recent ESI , hypertension and anxiety contributing. Also need to rlue out cervical disk disease given its improvement with prednisone   Hypertension Adding amlodipine 5 mg daily to metoprolol 50 mg daily     I discussed the assessment and treatment plan with the patient. The patient was provided an opportunity to ask questions and all were answered. The patient agreed with the plan and demonstrated an understanding of the instructions.   The patient was advised to call back or seek an in-person evaluation if the symptoms worsen or if the condition fails to improve as anticipated.  I provided  25 minutes of non-face-to-face time during this encounter reviewing patient's current problems and post surgeries.  Providing counseling on the above mentioned problems , and coordination  of care .  [name], MD

## 2019-05-23 NOTE — Telephone Encounter (Signed)
mychart message sent in regards to xray appt.

## 2019-05-23 NOTE — Telephone Encounter (Signed)
Pt called stating that her BP was 183/115 05/23/2019 at 0700; it was 142/97 05/22/2019 at 1830; she was started on Metoprolol XR 25 mg on 05/11/2019; the pt says that her BP was still not within range, and she was told to double her dose; these measurements were taken with a home cuff on her right arm; her blood pressure per the RN at her place job, her BP was  174/112 at 0830; recommendations made per nurse triage protocol; she verbalized understanding; the pt sees Dr Derrel Nip, [facility]; pt transferred to [facility] for scheduling.  Reason for Disposition . Systolic BP  >= 332 OR Diastolic >= 951  Answer Assessment - Initial Assessment Questions 1. BLOOD PRESSURE: "What is the blood pressure?" "Did you take at least two measurements 5 minutes apart?"   174/112 05/23/2019 at 0830  183/115 05/23/2019 at 0700, 142/97 05/22/2019 at 1830 2. ONSET: "When did you take your blood pressure?"      3. HOW: "How did you obtain the blood pressure?" (e.g., visiting nurse, automatic home BP monitor)     Home cuff and at work 4. HISTORY: "Do you have a history of high blood pressure?"    yes 5. MEDICATIONS: "Are you taking any medications for blood pressure?" "Have you missed any doses recently?"   Yes, has not missed any doses 6. OTHER SYMPTOMS: "Do you have any symptoms?" (e.g., headache, chest pain, blurred vision, difficulty breathing, weakness)  headache, intermittent blurry vision for the past 2 weeks 7. PREGNANCY: "Is there any chance you are pregnant?" "When was your last menstrual period?"    No IUD  Protocols used: HIGH BLOOD PRESSURE-A-AH

## 2019-05-23 NOTE — Telephone Encounter (Signed)
Call was transferred to me from [facility] triage nurse who recommended patient be seen w/i 24 hours.  [facility] triage nurse was not recommending ED or UC based on  patient having blurry vision and headache symptoms for the past 2 weeks w/ elevated bp.  Patient requested virtual visit due to work schedule.  Schedule patient a virtual visit w/ Dr. Derrel Nip for this morning at 10:00 am.

## 2019-05-26 ENCOUNTER — Telehealth: Payer: Self-pay

## 2019-05-26 ENCOUNTER — Encounter: Payer: Self-pay | Admitting: *Deleted

## 2019-05-26 DIAGNOSIS — G4733 Obstructive sleep apnea (adult) (pediatric): Secondary | ICD-10-CM

## 2019-05-26 NOTE — Telephone Encounter (Signed)
LMTCB

## 2019-05-26 NOTE — Telephone Encounter (Signed)
Patient returning call. Requesting call back 

## 2019-05-26 NOTE — Telephone Encounter (Signed)
Copied from [place] [id number]. Topic: General - Inquiry >> May 26, 2019  7:52 AM Scherrie Gerlach wrote: Reason for CRM: pt would like copy of sleep study faxed to her. (she is driving and does not have her fax number, but when you call her, she will be at work and can give to you then) She would like referral to ENT.  Pt states she went to dentist yesterday and they told her they make a device to help with snoring.  They told her if she takes her sleep study to an ENT, they could right the order for the device. Then her insurance will pay for it. Pt would like Janett Billow, Dr Derrel Nip asst, to call her and discuss.

## 2019-05-27 NOTE — Telephone Encounter (Signed)
Spoke with pt and she stated that she went to the dentist the other day and they told her about a mouth guard that helps with snoring by holding her mouth a certain way to keep the airways from being narrowed. They told her that she would need to get it from an ENT so she was wanting to see if she could get a referral to ENT and then take them the sleep study so her insurance will pay for it.

## 2019-05-28 NOTE — Telephone Encounter (Signed)
Referral to [facility] in process.  The sleep study will need to be faxed to him

## 2019-05-30 ENCOUNTER — Ambulatory Visit (INDEPENDENT_AMBULATORY_CARE_PROVIDER_SITE_OTHER): Payer: BC Managed Care – PPO | Admitting: Professional

## 2019-06-01 ENCOUNTER — Telehealth: Payer: Self-pay

## 2019-06-01 MED ORDER — METOPROLOL SUCCINATE ER 100 MG PO TB24: 100.0000 mg | ORAL_TABLET | Freq: Every day | ORAL | 0 refills | Status: DC

## 2019-06-01 NOTE — Telephone Encounter (Signed)
We can increase her amlodipine or her metoprolol, depending on her pulse.  If the pulse is > 80 , I would increase the metoprolol to 2 tablets (50 mg once daily).  If her pulse is > 80,  Should continue 25 mg metoprolol and increase amlodipine to 10 mg daily.  She might develop fluid retention at this higher dose , so if she does,  Let me know.   Excellent message,  by the way,  Thank you Butch Penny!

## 2019-06-01 NOTE — Telephone Encounter (Signed)
Copied from CRM #[id number]. Topic: General - Other >> Jun 01, 2019  8:20 AM Harris, Brenda J wrote: Reason for CRM: Patient called to check the status of a referral for an ENT and to discuss her BP.  Patient would like for the nurse to call her at [phone] 

## 2019-06-01 NOTE — Telephone Encounter (Signed)
Copied from [place] [id number]. Topic: General - Other >> Jun 01, 2019  8:20 AM Keene Breath wrote: Reason for CRM: Patient called to check the status of a referral for an ENT and to discuss her BP.  Patient would like for the nurse to call her at [phone]

## 2019-06-01 NOTE — Telephone Encounter (Signed)
fyi

## 2019-06-01 NOTE — Telephone Encounter (Signed)
Called and spoke to patient.  Patient said that Dr. Derrel Nip has already increased her metoprolol to 2 tabs of 25 mg daily (50 mg daily) and patient takes 5 mg of amlodipine.  Patient said that her pulse is always > 80.  Patient said last night her pulse was 86.  Please advise.

## 2019-06-01 NOTE — Telephone Encounter (Signed)
Called and spoke to patient.  Patient aware to increase metoprolol dose to 100 mg daily per Dr. Derrel Nip.  Patient also advised to call office back if she develops fluid retention per Dr. Lupita Dawn previous note.  Patient voiced understanding and had no questions or concerns.

## 2019-06-01 NOTE — Telephone Encounter (Signed)
OK.  THEN SHE CAN INCREASE THE METOPROLOL DOSE TO 100 MG DAILY.  I HAVE UPDATED THE CHART

## 2019-06-01 NOTE — Telephone Encounter (Signed)
Spoke with pt to let her know that the referral has been placed and that I have faxed the sleep study to the [place] office. Pt gave a verbal understanding.

## 2019-06-01 NOTE — Telephone Encounter (Signed)
Called and spoke to patient to triage for BP.  Patient said that last night at 160/103 and this morning 152/90.  Patient is currently taking 5 mg amlodipine daily and 25 mg of metoprolol daily.  Patient takes both bp meds in the morning.  Patient says that the only symptom that she is having is a bad headache.  Advised patient to go to UC if bp gets extremely high again.  Will forwarded to PCP for review.  Patient also wanted to check on status of ENT referral.  Forwarded message to referral coordinator.

## 2019-06-03 NOTE — Telephone Encounter (Signed)
Referral was sent to Dr. Kathyrn Sheriff at [facility] on 10/8. They have the referral in review and will be calling her very soon to schedule an appointment.

## 2019-06-06 ENCOUNTER — Telehealth: Payer: Self-pay

## 2019-06-06 NOTE — Telephone Encounter (Signed)
Copied from [place] [id number]. Topic: General - Other >> Jun 06, 2019  1:56 PM Keene Breath wrote: Reason for CRM: Patient called to ask that the nurse fax to [facility] a copy of her sleep study.  Fax# [phone].  If there are any questions, please call at [phone]

## 2019-06-08 NOTE — Telephone Encounter (Signed)
Faxed

## 2019-06-09 ENCOUNTER — Encounter: Payer: BC Managed Care – PPO | Attending: Surgery | Admitting: Dietician

## 2019-06-09 ENCOUNTER — Encounter: Payer: Self-pay | Admitting: Dietician

## 2019-06-09 ENCOUNTER — Other Ambulatory Visit: Payer: Self-pay

## 2019-06-09 VITALS — Ht 62.0 in | Wt 225.9 lb

## 2019-06-09 DIAGNOSIS — Z6841 Body Mass Index (BMI) 40.0 and over, adult: Secondary | ICD-10-CM | POA: Diagnosis not present

## 2019-06-09 NOTE — Patient Instructions (Signed)
   Practice slower eating and chewing foods thoroughly when eating; this will help with feeling satisfied after eating and is important after surgery.   Gradually reduce the amount of water during meals.   Work on some lower-carb alternatives for breakfast, such as peanut butter or cheese toast, lowfat breakfast sandwich, protein bar, etc.

## 2019-06-09 NOTE — Progress Notes (Signed)
Nutrition Assessment  Proposed Surgery: Sleeve Gastrectomy   Height: 5'2" Weight: 225.9lbs BMI: 41.32 Upper IBW% (UIBW): 214% (UIBW 121lbs)  Patient's Goal Weight: 140-150lbs  Medical History: recent dx of HTN, sleep apnea, Stage 0 breast CA Medications and Supplements: amLODipine, buPROPion, ibuprofen, lenonorgestrel, metoprolol, PARoxetine, raloxifene, traMADol  Previous surgeries: c-sect x2, gallbladder removal, renal stone x1 Drug allergies: dilaudid, azithromycin, nitrofurantoin Food allergies: none known Alcohol use: very rarely, maybe 1drink per year  Tobacco use: none, quit smokkng 2-3 years ago  Physical activity: none due to back and body pain, SOB  Weight history: Childhood: normal weight    Adolescence: normal weight; gained some weight during college    Adulthood: overweight after second pregnancy; significant weight gain since starting hormonal medication 2-3 years ago.    Weight 1 year ago: 220-225lbs; weight has been stable over the past year  Dieting/ weight loss history: gained weight during pregnancy, after son was born tried bariatric clinic including weight loss meds and lost to 120lbs and maintained for 1-2 years but regained after next pregnancy. Has since tried phentermine and syndaxa without benefit;  exercise and eating changes with only limited and short term success.   Dietary Recall:  Daily pattern: 3 meals and 1 snacks. Dining out: 2 meals per week. Breakfast: 2 pop tart; quick meal; did protein shakes in the past Snack: red grapes (favorite), banana, cantaloupe, occasionally chex mix or crackers Lunch: 10/14-- leftovers (steak bites, potato, rice); sometimes salad; chicken noodle soup and crackers; PBJ snadiwch with chips Supper: usu easy meals iehamburgers;  baked spaghetti and salad; tacos, frozen pizza, hamburger helper; grilled chicken thighs Beverages: only water, some with sugar free flavoring added Patient reports picky eating and at times has  difficulty finding healthy foods she likes.   Psychosocial: Emotional eating history: none Disordered eating history: none   Intervention:  Patient has researched this procedure by attended bariatric seminar, met with psychiatrist, consulted with friends and coworkers who have undergone weight loss surgery successes and issues.   Instructed her on pre-op diet guidelines, including liver reduction diet, and pre-op nutrition goals.  Discussed factors affecting hunger and satiety and encouraged reduction in carbohydrate intake with meals, portion control, eating slowly, including high protein and high fiber foods especially vegetables. Discussed options to match patient's food preferences.   Discussed stages of the bariatric diet after surgery as well as the importance of adequate protein and fluid intake.   Summary:  Patient has made diet and lifestyle changes in effort to control  weight and prepare for bariatric surgery.  She has solid support from family and friends; husband is considering weight loss surgery also.   She agrees to work on reducing starchy foods, chewing foods more thoroughly, and reducing fluids during meals prior to surgery.   She is motivated to follow the bariatric diet after surgery. From a nutrition standpoint, she is ready to proceed with the bariatric surgery program.    Plan:  Patient commits to returning for  Pre-op class prior to surgery.   She will plan to return for post-op RD visits beginning 2 weeks after surgery.

## 2019-06-13 ENCOUNTER — Ambulatory Visit: Payer: BC Managed Care – PPO | Admitting: Professional

## 2019-06-17 ENCOUNTER — Other Ambulatory Visit: Payer: Self-pay | Admitting: General Surgery

## 2019-06-17 DIAGNOSIS — D0501 Lobular carcinoma in situ of right breast: Secondary | ICD-10-CM

## 2019-06-17 MED ORDER — RALOXIFENE HCL 60 MG PO TABS: 60.0000 mg | ORAL_TABLET | Freq: Every day | ORAL | 3 refills | Status: DC

## 2019-06-17 NOTE — Progress Notes (Signed)
Patient called for refill on Evista, treatment for LCIS. Refill sent.  Mammos June 2020 reported as normal. Will ask patient to return for a f/u visit in June 2021.

## 2019-06-18 ENCOUNTER — Ambulatory Visit (INDEPENDENT_AMBULATORY_CARE_PROVIDER_SITE_OTHER): Payer: BC Managed Care – PPO | Admitting: Professional

## 2019-06-18 DIAGNOSIS — F509 Eating disorder, unspecified: Secondary | ICD-10-CM

## 2019-06-18 DIAGNOSIS — F4323 Adjustment disorder with mixed anxiety and depressed mood: Secondary | ICD-10-CM

## 2019-06-28 ENCOUNTER — Ambulatory Visit: Payer: BC Managed Care – PPO | Admitting: Internal Medicine

## 2019-07-02 ENCOUNTER — Ambulatory Visit: Payer: BC Managed Care – PPO | Admitting: Professional

## 2019-07-13 ENCOUNTER — Other Ambulatory Visit: Payer: Self-pay

## 2019-07-13 MED ORDER — METOPROLOL SUCCINATE ER 100 MG PO TB24: 100.0000 mg | ORAL_TABLET | Freq: Every day | ORAL | 1 refills | Status: DC

## 2019-07-15 ENCOUNTER — Encounter: Payer: BC Managed Care – PPO | Attending: Surgery | Admitting: Dietician

## 2019-07-15 ENCOUNTER — Other Ambulatory Visit: Payer: Self-pay

## 2019-07-15 VITALS — Ht 62.0 in | Wt 229.3 lb

## 2019-07-15 DIAGNOSIS — Z6841 Body Mass Index (BMI) 40.0 and over, adult: Secondary | ICD-10-CM | POA: Insufficient documentation

## 2019-07-15 DIAGNOSIS — Z713 Dietary counseling and surveillance: Secondary | ICD-10-CM | POA: Diagnosis present

## 2019-07-15 NOTE — Progress Notes (Signed)
Pre-Operative Nutrition Class:  Appt start time: 0900   End time:  1100.  Patient was seen on 07/15/19 for Pre-Operative Bariatric Surgery Education at [facility] at [facility].   Surgery date: TBD Surgery type: Sleeve Gastrectomy Start weight at [facility]: 225.9lbs Weight today: 229.3lbs  InBody  BODY COMP RESULTS    BMI (kg/m^2) 41.9  Fat Mass (lbs) 115.5  Dry Lean Mass (lbs) 29.5  Total Body Water (lbs) 84.2   Samples given per MNT protocol. Patient educated on appropriate usage: Celebrate Vitamins Multivitamin  Lot # [id number],  Exp: 03/20232 Lot# [id number], Exp: 11/2020  Quick melt B12  Lot# [id number], Exp: 08/2019  Iron 47m LBHA#[id number] Exp: 01/2021; 427mLot# [id number]: 01/2021  Celebrate Vitamins Calcium Citrate   Lot # [id number]: 09/2019; Lot# [id number]: 04/2020; Lot# [id number] 01/2020; Lot# [id number], exp: 02/2020; Lot# [id number]: 03/2020; Lot# [id number], exp: 02/2020; Lot# 0002, exp: 02/2020; Lot# [id number], exp: 11/2-21  UnRenee Painrotein Powder   Lot # [id number]: 01/2020; Lot# [id number]: 01/2020; Lot# [id number]: 01/2020  UnArvadarotein Shake   Lot# [id number]: 08/14/19, or LoYOV#[id number]: 08/15/19  The following the learning objectives were met by the patient during this course:  Identify Pre-Op Dietary Goals and will begin 2 weeks pre-operatively  Identify appropriate sources of fluids and proteins   State protein recommendations and appropriate sources pre and post-operatively  Identify Post-Operative Dietary Goals and will follow for 2 weeks post-operatively  Identify appropriate multivitamin and calcium sources  Describe the need for physical activity post-operatively and will follow MD recommendations  State when to call healthcare provider regarding medication questions or post-operative complications  Handouts given during class include:  Pre-Op Bariatric Surgery Diet Handout  Protein Shake Handout  Post-Op Bariatric  Surgery Nutrition Handout  BELT Program Information Flyer  Support Group Information Flyer  [facility] Bariatric Supplements Price List  [place]: [place] for diet advancement per MD. -

## 2019-07-16 ENCOUNTER — Ambulatory Visit (INDEPENDENT_AMBULATORY_CARE_PROVIDER_SITE_OTHER): Payer: BC Managed Care – PPO | Admitting: Professional

## 2019-07-16 DIAGNOSIS — F4323 Adjustment disorder with mixed anxiety and depressed mood: Secondary | ICD-10-CM

## 2019-07-29 ENCOUNTER — Ambulatory Visit: Payer: BC Managed Care – PPO

## 2019-07-30 ENCOUNTER — Ambulatory Visit: Payer: BC Managed Care – PPO | Admitting: Professional

## 2019-08-01 ENCOUNTER — Ambulatory Visit (INDEPENDENT_AMBULATORY_CARE_PROVIDER_SITE_OTHER): Payer: BC Managed Care – PPO | Admitting: Internal Medicine

## 2019-08-01 ENCOUNTER — Encounter: Payer: Self-pay | Admitting: Internal Medicine

## 2019-08-01 ENCOUNTER — Other Ambulatory Visit: Payer: Self-pay

## 2019-08-01 VITALS — Ht 62.0 in | Wt 229.0 lb

## 2019-08-01 DIAGNOSIS — R140 Abdominal distension (gaseous): Secondary | ICD-10-CM

## 2019-08-01 DIAGNOSIS — M25571 Pain in right ankle and joints of right foot: Secondary | ICD-10-CM | POA: Diagnosis not present

## 2019-08-01 DIAGNOSIS — F419 Anxiety disorder, unspecified: Secondary | ICD-10-CM

## 2019-08-01 DIAGNOSIS — F32A Depression, unspecified: Secondary | ICD-10-CM

## 2019-08-01 DIAGNOSIS — F329 Major depressive disorder, single episode, unspecified: Secondary | ICD-10-CM

## 2019-08-01 DIAGNOSIS — G4452 New daily persistent headache (NDPH): Secondary | ICD-10-CM

## 2019-08-01 DIAGNOSIS — G8929 Other chronic pain: Secondary | ICD-10-CM

## 2019-08-01 DIAGNOSIS — E669 Obesity, unspecified: Secondary | ICD-10-CM

## 2019-08-01 DIAGNOSIS — E66811 Obesity, class 1: Secondary | ICD-10-CM

## 2019-08-01 DIAGNOSIS — I10 Essential (primary) hypertension: Secondary | ICD-10-CM

## 2019-08-01 MED ORDER — TELMISARTAN 40 MG PO TABS: 40.0000 mg | ORAL_TABLET | Freq: Every day | ORAL | 1 refills | Status: DC

## 2019-08-01 MED ORDER — HYDROCHLOROTHIAZIDE 25 MG PO TABS: 25.0000 mg | ORAL_TABLET | Freq: Every day | ORAL | 3 refills | Status: DC

## 2019-08-01 NOTE — Progress Notes (Signed)
Virtual Visit via  [email or web address]  This visit type was conducted due to national recommendations for restrictions regarding the COVID-19 pandemic (e.g. social distancing).  This format is felt to be most appropriate for this patient at this time.  All issues noted in this document were discussed and addressed.  No physical exam was performed (except for noted visual exam findings with Video Visits).   I connected with@ on 08/01/19 at 10:00 AM EST by a video enabled telemedicine application  and verified that I am speaking with the correct person using two identifiers. Location patient: home Location provider: work or home office Persons participating in the virtual visit: patient, provider  I discussed the limitations, risks, security and privacy concerns of performing an evaluation and management service by telephone and the availability of in person appointments. I also discussed with the patient that there may be a patient responsible charge related to this service. The patient expressed understanding and agreed to proceed.  Reason for visit: preoperative evaluation for gastric sleeve  Aug 08 2019   HPI:  48 yr old female with severe obesity, hypertension,  Depression with anxiety  And  Lobular carcinoma in situ  Presents for preoperative consultation   HTN:  Managed with amlodipine and metoprolol.  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   Right ankle pain lateral and swelling for the past several months,  No trauma  Was referred to [facility] in 2018 for calcaneal spur but could not keep appt   Back pain managed with tramadol and tylenol   ROS: See pertinent positives and negatives per HPI.  Past Medical History:  Diagnosis Date  . BRCA negative 02/2019   MyRisk neg except AXIN2 VUS  . Depression   . Frequent headaches   . Head cold   . Headache(784.0)   . History of frequent urinary tract infections   . History of kidney  stones   . Increased risk of breast cancer 02/2019   IBIS=66% due to hx of lobular CIS  . Kidney stones   . Left ureteral calculus   . 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 [name]    Past Surgical History:  Procedure Laterality Date  . BREAST BIOPSY Right 02/02/2013   [place] bx/clip-neg  . BREAST BIOPSY Right 02/19/2017   affirm, ATYPICAL LOBULAR HYPERPLASIA  . BREAST BIOPSY Right 03/04/2017   Procedure: BREAST BIOPSY WITH NEEDLE LOCALIZATION;  Surgeon: [name], MD;  Location: [facility] ORS;  Service: General;  Laterality: Right;  . BREAST LUMPECTOMY Right 2018  . CESAREAN SECTION  2005  . CHOLECYSTECTOMY  09/2012  . CYSTOSCOPY W/ RETROGRADES N/A 02/02/2017   Procedure: CYSTOSCOPY WITH RETROGRADE PYELOGRAM;  Surgeon: [name], MD;  Location: [facility] ORS;  Service: Urology;  Laterality: N/A;  . EXTRACORPOREAL SHOCK WAVE LITHOTRIPSY  2009  . INTRAUTERINE DEVICE INSERTION  MARCH 2012   MURINA  . [place]arr  (APPROX)   Kidney Stone  . URETEROSCOPY WITH HOLMIUM LASER LITHOTRIPSY Left 02/02/2017   Procedure: URETEROSCOPY WITH HOLMIUM LASER LITHOTRIPSY;  Surgeon: [name], MD;  Location: [facility] ORS;  Service: Urology;  Laterality: Left;    Family History  Problem Relation Age of Onset  . Lung cancer Father   . Other Mother        breast cyst  . Breast cancer Mother 7       tested  . Alcohol abuse Maternal Uncle   . Alcohol  abuse Paternal Uncle   . Ovarian cancer Maternal Grandmother 76  . Lymphoma Maternal Grandfather        cancer  . Breast cancer Maternal Aunt [address] late 5s  . Alcohol abuse Paternal Grandfather     SOCIAL HX:  reports that she quit smoking about 5 years ago. Her smoking use included cigarettes. She has a 8.00 pack-year smoking history. She has never used smokeless tobacco. She reports current alcohol use. She reports that she does not use drugs.   Current Outpatient Medications:  .  buPROPion (WELLBUTRIN XL)  300 MG 24 hr tablet, Take 1 tablet (300 mg total) by mouth daily., Disp: 90 tablet, Rfl: 1 .  levonorgestrel (MIRENA) 20 MCG/24HR IUD, 1 each by Intrauterine route once., Disp: , Rfl:  .  metoprolol succinate (TOPROL-XL) 100 MG 24 hr tablet, Take 1 tablet (100 mg total) by mouth daily., Disp: 90 tablet, Rfl: 1 .  PARoxetine (PAXIL) 10 MG tablet, TAKE 1 TABLET(10 MG) BY MOUTH DAILY, Disp: 90 tablet, Rfl: 1 .  raloxifene (EVISTA) 60 MG tablet, TAKE 1 TABLET(60 MG) BY MOUTH DAILY, Disp: 30 tablet, Rfl: 12 .  traMADol (ULTRAM) 50 MG tablet, Take 1 tablet (50 mg total) by mouth 3 (three) times daily as needed., Disp: 90 tablet, Rfl: 1 .  cyclobenzaprine (FLEXERIL) 10 MG tablet, Take 1 tablet (10 mg total) by mouth 3 (three) times daily as needed for muscle spasms. (Patient not taking: Reported on 06/09/2019), Disp: 30 tablet, Rfl: 0 .  hydrochlorothiazide (HYDRODIURIL) 25 MG tablet, Take 1 tablet (25 mg total) by mouth daily., Disp: 90 tablet, Rfl: 3 .  HYDROcodone-acetaminophen (NORCO) 7.5-325 MG tablet, Take 1 tablet by mouth every 6 (six) hours as needed for moderate pain or severe pain. (Patient not taking: Reported on 08/01/2019), Disp: 30 tablet, Rfl: 0 .  ibuprofen (ADVIL,MOTRIN) 200 MG tablet, Take 600-800 mg by mouth every 6 (six) hours as needed for headache., Disp: , Rfl:  .  telmisartan (MICARDIS) 40 MG tablet, Take 1 tablet (40 mg total) by mouth at bedtime., Disp: 90 tablet, Rfl: 1  EXAM:  VITALS per patient if applicable:  GENERAL: alert, oriented, appears well and in no acute distress  HEENT: atraumatic, conjunttiva clear, no obvious abnormalities on inspection of external nose and ears  NECK: normal movements of the head and neck  LUNGS: on inspection no signs of respiratory distress, breathing rate appears normal, no obvious gross SOB, gasping or wheezing  CV: no obvious cyanosis  MS: moves all visible extremities without noticeable abnormality  PSYCH/NEURO: pleasant and  cooperative, no obvious depression or anxiety, speech and thought processing grossly intact  ASSESSMENT AND PLAN:  Discussed the following assessment and plan:  Chronic pain of right ankle - Plan: Ambulatory referral to Podiatry  Abdominal distension  Anxiety and depression  Hypertension, unspecified type  Obesity (BMI 30.0-34.9)  New persistent daily headache  Chronic pain of right ankle Calcaneal spur noted on 2018 films. Ankle mortise intact refer to Samara Deist.  Stopping amlodipine as it may be aggravating swelling   Abdominal distension Patient no showed for her ultrasound appointment .  CA 125 was normal Jan 2020    Anxiety and depression Managed with  Paroxetine and wellbutrin for management of anxiety . No changes today   Hypertension Changing regimen today:  D/c amlodipine.  Start hctz 25 mg daily to manage fluid retention.  Will stop it one day prior to surgery,  Adding telmisartan 4 mg qhs  For bp > 140/80 on Friday .  Continue metoprolol xl 100 mg daily   Obesity (BMI 30.0-34.9) For gastric sleeve surgery at [facility] on Monday Dec 12. Patient  is considered to be at low risk  For perioperative complications  Based on today's exam and history.   New persistent daily headache Likely multifactorial  With OSA,   Cervical disk disease , hypertension and anxiety contributing.     I discussed the assessment and treatment plan with the patient. The patient was provided an opportunity to ask questions and all were answered. The patient agreed with the plan and demonstrated an understanding of the instructions.   The patient was advised to call back or seek an in-person evaluation if the symptoms worsen or if the condition fails to improve as anticipated.  I provided  25 minutes of non-face-to-face time during this encounter reviewing patient's current problems and post surgeries.  Providing counseling on the above mentioned problems , and coordination  of care  .  [name], MD

## 2019-08-01 NOTE — Patient Instructions (Addendum)
Stop the amlodipine as of today  Start the hctz in the morning starting tomorrow  Continue metoprolol   Get your BP checked on Friday and send me the results   START THE TELMISARTAN ON Saturday TAKE AT NIGHT)  AND STOP THE HCTZ  ON Sunday (LAST DOSE ON Sunday)

## 2019-08-01 NOTE — Assessment & Plan Note (Signed)
Calcaneal spur noted on 2018 films. Ankle mortise intact refer to Ariana Weeks.  Stopping amlodipine as it may be aggravating swelling

## 2019-08-01 NOTE — Assessment & Plan Note (Signed)
Likely multifactorial  With OSA,   Cervical disk disease , hypertension and anxiety contributing.

## 2019-08-01 NOTE — Assessment & Plan Note (Signed)
For gastric sleeve surgery at [facility] on Monday Dec 12. Patient  is considered to be at low risk  For perioperative complications  Based on today's exam and history.

## 2019-08-01 NOTE — Assessment & Plan Note (Signed)
Managed with  Paroxetine and wellbutrin for management of anxiety . No changes today

## 2019-08-01 NOTE — Assessment & Plan Note (Addendum)
Changing regimen today:  D/c amlodipine.  Start hctz 25 mg daily to manage fluid retention.  Will stop it one day prior to surgery,  Adding telmisartan 4 mg qhs  For bp > 140/80 on Friday .  Continue metoprolol xl 100 mg daily

## 2019-08-01 NOTE — Assessment & Plan Note (Signed)
Patient no showed for her ultrasound appointment .  CA 125 was normal Jan 2020

## 2019-08-03 NOTE — Patient Instructions (Addendum)
DUE TO COVID-19 ONLY ONE VISITOR IS ALLOWED TO COME WITH YOU AND STAY IN THE WAITING ROOM ONLY DURING PRE OP AND PROCEDURE DAY OF SURGERY. THE 1 VISITOR MAY VISIT WITH YOU AFTER SURGERY IN YOUR PRIVATE ROOM DURING VISITING HOURS ONLY!  YOU NEED TO HAVE A COVID 19 TEST ON__12/11_____ @_______ , THIS TEST MUST BE DONE BEFORE SURGERY, COME  [address], [place] , [phone].  [facility] ONCE YOUR COVID TEST IS COMPLETED, PLEASE BEGIN THE QUARANTINE INSTRUCTIONS AS OUTLINED IN YOUR HANDOUT.                Ariana Weeks    Your procedure is scheduled on: 08/09/19   [facility]   Report to admitting at  10:20 AM     Call this number if you have problems the morning of surgery [phone]     . BRUSH YOUR TEETH MORNING OF SURGERY AND RINSE YOUR MOUTH OUT, NO CHEWING GUM CANDY OR MINTS.    NO SOLID FOOD AFTER 6:00 PM THE NIGHT BEFORE YOUR SURGERY. YOU MAY DRINK CLEAR FLUIDS until 9:00AM    CLEAR LIQUID DIET   Foods Allowed                                                                     Foods Excluded  Coffee and tea, regular and decaf                             liquids that you cannot  Plain Jell-O any favor except red or purple                                           see through such as: Fruit ices (not with fruit pulp)                                     milk, soups, orange juice  Iced Popsicles                                    All solid food Carbonated beverages, regular and diet                                    Cranberry, grape and apple juices Sports drinks like Gatorade Lightly seasoned clear broth or consume(fat free) Sugar, honey syrup      PAIN IS EXPECTED AFTER SURGERY AND WILL NOT BE COMPLETELY ELIMINATED. AMBULATION AND TYLENOL WILL HELP REDUCE INCISIONAL AND GAS PAIN. MOVEMENT IS KEY!  YOU ARE EXPECTED TO BE OUT OF BED WITHIN 4 HOURS OF ADMISSION TO YOUR PATIENT ROOM.  SITTING IN THE RECLINER  THROUGHOUT THE DAY IS IMPORTANT FOR DRINKING FLUIDS AND MOVING GAS THROUGHOUT THE GI TRACT.  COMPRESSION STOCKINGS SHOULD BE WORN [facility] UNLESS YOU ARE WALKING.  INCENTIVE SPIROMETER SHOULD BE USED EVERY HOUR WHILE AWAKE TO DECREASE POST-OPERATIVE COMPLICATIONS SUCH AS PNEUMONIA.  WHEN DISCHARGED HOME, IT IS IMPORTANT TO CONTINUE TO WALK EVERY HOUR AND USE THE INCENTIVE SPIROMETER EVERY HOUR.    Take these medicines the morning of surgery with A SIP OF WATER: Wellbutrin,Paxil,metoprolol.                                 You may not have any metal on your body including hair pins and              piercings  Do not wear jewelry, make-up, lotions, powders or perfumes, deodorant             Do not wear nail polish on your fingernails.  Do not shave  48 hours prior to surgery.           Do not bring valuables to the hospital. [place].  Contacts, dentures or bridgework may not be worn into surgery.        Special Instructions: N/A              Please read over the following fact sheets you were given: _____________________________________________________________________  [facility] - Preparing for Surgery  Before surgery, you can play an important role.   Because skin is not sterile, your skin needs to be as free of germs as possible.   You can reduce the number of germs on your skin by washing with CHG (chlorahexidine gluconate) soap before surgery.   CHG is an antiseptic cleaner which kills germs and bonds with the skin to continue killing germs even after washing. Please DO NOT use if you have an allergy to CHG or antibacterial soaps.   If your skin becomes reddened/irritated stop using the CHG and inform your nurse when you arrive at Short Stay. Do not shave (including legs and underarms) for at least 48 hours prior to the first CHG shower . Please follow these instructions carefully:  1.  Shower with CHG  Soap the night before surgery and the  morning of Surgery.  2.  If you choose to wash your hair, wash your hair first as usual with your  normal  shampoo.  3.  After you shampoo, rinse your hair and body thoroughly to remove the  shampoo.                                        4.  Use CHG as you would any other liquid soap.  You can apply chg directly  to the skin and wash                       Gently with a scrungie or clean washcloth.  5.  Apply the CHG Soap to your body ONLY FROM THE NECK DOWN.   Do not use on face/ open                           Wound or open sores. Avoid contact with eyes, ears mouth and genitals (private parts).  Wash face,  Genitals (private parts) with your normal soap.             6.  Wash thoroughly, paying special attention to the area where your surgery  will be performed.  7.  Thoroughly rinse your body with warm water from the neck down.  8.  DO NOT shower/wash with your normal soap after using and rinsing off  the CHG Soap.             9.  Pat yourself dry with a clean towel.            10.  Wear clean pajamas.            11.  Place clean sheets on your bed the night of your first shower and do not  sleep with pets. Day of Surgery : Do not apply any lotions/deodorants the morning of surgery.  Please wear clean clothes to the hospital/surgery center.  FAILURE TO FOLLOW THESE INSTRUCTIONS MAY RESULT IN THE CANCELLATION OF YOUR SURGERY PATIENT SIGNATURE_________________________________  NURSE SIGNATURE__________________________________  ________________________________________________________________________    Ariana Weeks  An incentive spirometer is a tool that can help keep your lungs clear and active. This tool measures how well you are filling your lungs with each breath. Taking long deep breaths may help reverse or decrease the chance of developing breathing (pulmonary) problems (especially infection) following:  A long  period of time when you are unable to move or be active. BEFORE THE PROCEDURE   If the spirometer includes an indicator to show your best effort, your nurse or respiratory therapist will set it to a desired goal.  If possible, sit up straight or lean slightly forward. Try not to slouch.  Hold the incentive spirometer in an upright position. INSTRUCTIONS FOR USE  1. Sit on the edge of your bed if possible, or sit up as far as you can in bed or on a chair. 2. Hold the incentive spirometer in an upright position. 3. Breathe out normally. 4. Place the mouthpiece in your mouth and seal your lips tightly around it. 5. Breathe in slowly and as deeply as possible, raising the piston or the ball toward the top of the column. 6. Hold your breath for 3-5 seconds or for as long as possible. Allow the piston or ball to fall to the bottom of the column. 7. Remove the mouthpiece from your mouth and breathe out normally. 8. Rest for a few seconds and repeat Steps 1 through 7 at least 10 times every 1-2 hours when you are awake. Take your time and take a few normal breaths between deep breaths. 9. The spirometer may include an indicator to show your best effort. Use the indicator as a goal to work toward during each repetition. 10. After each set of 10 deep breaths, practice coughing to be sure your lungs are clear. If you have an incision (the cut made at the time of surgery), support your incision when coughing by placing a pillow or rolled up towels firmly against it. Once you are able to get out of bed, walk around indoors and cough well. You may stop using the incentive spirometer when instructed by your caregiver.  RISKS AND COMPLICATIONS  Take your time so you do not get dizzy or light-headed.  If you are in pain, you may need to take or ask for pain medication before doing incentive spirometry. It is harder to take a deep breath if you are having pain. AFTER USE  Rest and breathe slowly and  easily.  It can be helpful to keep track of a log of your progress. Your caregiver can provide you with a simple table to help with this. If you are using the spirometer at home, follow these instructions: SEEK MEDICAL CARE IF:   You are having difficultly using the spirometer.  You have trouble using the spirometer as often as instructed.  Your pain medication is not giving enough relief while using the spirometer.  You develop fever of 100.5 F (38.1 C) or higher. SEEK IMMEDIATE MEDICAL CARE IF:   You cough up bloody sputum that had not been present before.  You develop fever of 102 F (38.9 C) or greater.  You develop worsening pain at or near the incision site. MAKE SURE YOU:   Understand these instructions.  Will watch your condition.  Will get help right away if you are not doing well or get worse. Document Released: 12/22/2006 Document Revised: 11/03/2011 Document Reviewed: 02/22/2007 [facility] Patient Information 2014 [place], [place].   ________________________________________________________________________

## 2019-08-04 ENCOUNTER — Encounter (HOSPITAL_COMMUNITY): Payer: Self-pay

## 2019-08-04 ENCOUNTER — Other Ambulatory Visit: Payer: Self-pay

## 2019-08-04 ENCOUNTER — Encounter (HOSPITAL_COMMUNITY)
Admission: RE | Admit: 2019-08-04 | Discharge: 2019-08-04 | Disposition: A | Discharge status: Home / Self Care | Payer: BC Managed Care – PPO | Source: Ambulatory Visit | Attending: Surgery | Admitting: Surgery

## 2019-08-04 ENCOUNTER — Inpatient Hospital Stay (HOSPITAL_COMMUNITY)
Admission: RE | Admit: 2019-08-04 | Discharge: 2019-08-04 | Disposition: A | Discharge status: Home / Self Care | Payer: BC Managed Care – PPO | Source: Ambulatory Visit

## 2019-08-04 DIAGNOSIS — Z01812 Encounter for preprocedural laboratory examination: Secondary | ICD-10-CM | POA: Insufficient documentation

## 2019-08-04 HISTORY — DX: Essential (primary) hypertension: I10

## 2019-08-04 HISTORY — DX: Sleep apnea, unspecified: G47.30

## 2019-08-04 LAB — BASIC METABOLIC PANEL
Anion gap: 10 (ref 5–15)
BUN: 16 mg/dL (ref 6–20)
CO2: 29 mmol/L (ref 22–32)
Calcium: 9.7 mg/dL (ref 8.9–10.3)
Chloride: 100 mmol/L (ref 98–111)
Creatinine, Ser: 0.79 mg/dL (ref 0.44–1.00)
GFR calc Af Amer: >60 mL/min (ref >60)
GFR calc non Af Amer: >60 mL/min (ref >60)
Glucose, Bld: 84 mg/dL (ref 70–99)
Potassium: 3.6 mmol/L (ref 3.5–5.1)
Sodium: 139 mmol/L (ref 135–145)

## 2019-08-04 LAB — CBC
HCT: 41.8 % (ref 36.0–46.0)
Hemoglobin: 13.4 g/dL (ref 12.0–15.0)
MCH: 29.5 pg (ref 26.0–34.0)
MCHC: 32.1 g/dL (ref 30.0–36.0)
MCV: 92.1 fL (ref 80.0–100.0)
Platelets: 257 10*3/uL (ref 150–400)
RBC: 4.54 MIL/uL (ref 3.87–5.11)
RDW: 14.2 % (ref 11.5–15.5)
WBC: 6.5 10*3/uL (ref 4.0–10.5)
nRBC: 0 % (ref 0.0–0.2)

## 2019-08-04 NOTE — Progress Notes (Signed)
LVM as pt is too early for covid testing for the procedure on 12/15.

## 2019-08-04 NOTE — Progress Notes (Signed)
PCP - Dr. Judieth Keens Cardiologist - none  Chest x-ray - 04/13/19 EKG - 04/13/19 Stress Test - no ECHO - no Cardiac Cath - no  Sleep Study - yes CPAP - was not able to afford the mask and machine until 2021.  Fasting Blood Sugar - NA Checks Blood Sugar _____ times a day  Blood Thinner Instructions:NA Aspirin Instructions: Last Dose:  Anesthesia review:   Patient denies shortness of breath, fever, cough and chest pain at PAT appointment yes  Patient verbalized understanding of instructions that were given to them at the PAT appointment. Patient was also instructed that they will need to review over the PAT instructions again at home before surgery. yes

## 2019-08-05 ENCOUNTER — Other Ambulatory Visit
Admission: RE | Admit: 2019-08-05 | Discharge: 2019-08-05 | Disposition: A | Discharge status: Home / Self Care | Payer: BC Managed Care – PPO | Source: Ambulatory Visit | Attending: Surgery | Admitting: Surgery

## 2019-08-05 DIAGNOSIS — Z20828 Contact with and (suspected) exposure to other viral communicable diseases: Secondary | ICD-10-CM | POA: Diagnosis not present

## 2019-08-05 DIAGNOSIS — Z01812 Encounter for preprocedural laboratory examination: Secondary | ICD-10-CM | POA: Insufficient documentation

## 2019-08-06 LAB — SARS CORONAVIRUS 2 (TAT 6-24 HRS): SARS Coronavirus 2: NEGATIVE

## 2019-08-08 NOTE — H&P (Addendum)
Quentin Mulling Location: [place] Office Patient #: [id number] DOB: August 05, 1971 Married / Language: English / Race: White Female   History of Present Illness  The patient is a 48 year old female who presents for a bariatric surgery evaluation. 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 She has struggled with obesity all of her life and she and Dr. Derrel Nip at [facility] on [address] have tried weight loss medications in addtion to the many diets that she has attempted with limited success. She presents today with a weight of 221 and a BMI of 40.53. She has a pending sleep study. She is interested in [place] for a sleeve gastrectomy. I went over the procedure with her again and answered questions.  UGI reviewed and shows a small sliding hiatal hernia-she has infrequent reflux and takes Tagamet for this.    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 She is motivated to qualify for sleeve gastrectomy. She is ready for sleeve gastrectomy   Past Surgical History Malachi Bonds, CMA; 03/30/2019 10:27 AM) Breast Biopsy  Right. Cesarean Section - Multiple  Gallbladder Surgery - Laparoscopic   Diagnostic Studies History Malachi Bonds, CMA; 03/30/2019 10:27 AM) Colonoscopy  never Mammogram  within last year Pap Smear  1-5 years ago  Allergies Malachi Bonds, CMA; 03/30/2019 10:26 AM) Macrobid *URINARY ANTI-INFECTIVES*   Medication History Malachi Bonds, CMA; 03/30/2019 10:27 AM) Raloxifene HCl (60MG  Tablet, Oral) Active. buPROPion HCl ER (XL) (300MG  Tablet ER 24HR, Oral) Active. PARoxetine HCl (10MG  Tablet, Oral) Active. Medications Reconciled  Social History Malachi Bonds, CMA; 03/30/2019 10:27 AM) Alcohol use  Occasional alcohol use. No caffeine use  No drug use  Tobacco use  Former smoker.  Family History Malachi Bonds, CMA; 03/30/2019 10:27  AM) Alcohol Abuse  Brother. Breast Cancer  Mother. Ovarian Cancer  Family Members In General.  Pregnancy / Birth History Malachi Bonds, CMA; 03/30/2019 10:27 AM) Age at menarche  25 years. Contraceptive History  Intrauterine device. Gravida  2 Maternal age  62-35 Para  2 Regular periods   Other Problems Malachi Bonds, CMA; 03/30/2019 10:27 AM) Anxiety Disorder  Back Pain  Kidney Stone     Review of Systems Malachi Bonds CMA; 03/30/2019 10:27 AM) General Present- Fatigue and Weight Gain. Not Present- Appetite Loss, Chills, Fever, Night Sweats and Weight Loss. HEENT Present- Wears glasses/contact lenses. Not Present- Earache, Hearing Loss, Hoarseness, Nose Bleed, Oral Ulcers, Ringing in the Ears, Seasonal Allergies, Sinus Pain, Sore Throat, Visual Disturbances and Yellow Eyes. Cardiovascular Present- Shortness of Breath. Not Present- Chest Pain, Difficulty Breathing Lying Down, Leg Cramps, Palpitations, Rapid Heart Rate and Swelling of Extremities. Female Genitourinary Present- Frequency. Not Present- Nocturia, Painful Urination, Pelvic Pain and Urgency. Musculoskeletal Present- Back Pain and Joint Pain. Not Present- Joint Stiffness, Muscle Pain, Muscle Weakness and Swelling of Extremities. Psychiatric Present- Anxiety. Not Present- Bipolar, Change in Sleep Pattern, Depression, Fearful and Frequent crying.  Vitals (Chemira Jones CMA; 03/30/2019 10:25 AM) 03/30/2019 10:25 AM Weight: 221.6 lb Height: 62in Body Surface Area: 2 m Body Mass Index: 40.53 kg/m  Temp.: 97.72F(Oral)  Pulse: 98 (Regular)  BP: 130/78 (Sitting, Left Arm, Standard)   Physical Exam The physical exam findings are as follows: Note:Obese WF NAD but somewhat breathless after walking up the stairs to my office HEENT glasses Neck supple Chest BS clear Heart sinus tach 98 Abdomen nontender, Pfaninstiel incision and lap chole incisions Ext  no prior DVT; Neuro  alert and oriented x 3; good cognition and fund of knowledge.    Assessment & Plan  MORBID OBESITY, UNSPECIFIED OBESITY TYPE (E66.01)---for lap sleeve gastrectomy and possible repair of hiatal hernia  Matt B. Daphine Deutscher, MD, FACS

## 2019-08-09 ENCOUNTER — Inpatient Hospital Stay (HOSPITAL_COMMUNITY): Payer: BC Managed Care – PPO | Admitting: Anesthesiology

## 2019-08-09 ENCOUNTER — Encounter (HOSPITAL_COMMUNITY)
Admission: RE | Disposition: A | Discharge status: Home / Self Care | Payer: Self-pay | Source: Home / Self Care | Attending: Surgery

## 2019-08-09 ENCOUNTER — Inpatient Hospital Stay (HOSPITAL_COMMUNITY): Payer: BC Managed Care – PPO | Admitting: Physician Assistant

## 2019-08-09 ENCOUNTER — Other Ambulatory Visit: Payer: Self-pay

## 2019-08-09 ENCOUNTER — Inpatient Hospital Stay (HOSPITAL_COMMUNITY)
Admission: RE | Admit: 2019-08-09 | Discharge: 2019-08-10 | DRG: 621 | Disposition: A | Discharge status: Home / Self Care | Payer: BC Managed Care – PPO | Attending: Surgery | Admitting: Surgery

## 2019-08-09 ENCOUNTER — Encounter (HOSPITAL_COMMUNITY): Payer: Self-pay | Admitting: Surgery

## 2019-08-09 DIAGNOSIS — Z885 Allergy status to narcotic agent status: Secondary | ICD-10-CM | POA: Diagnosis not present

## 2019-08-09 DIAGNOSIS — Z713 Dietary counseling and surveillance: Secondary | ICD-10-CM

## 2019-08-09 DIAGNOSIS — Z8041 Family history of malignant neoplasm of ovary: Secondary | ICD-10-CM

## 2019-08-09 DIAGNOSIS — Z975 Presence of (intrauterine) contraceptive device: Secondary | ICD-10-CM | POA: Diagnosis not present

## 2019-08-09 DIAGNOSIS — Z87891 Personal history of nicotine dependence: Secondary | ICD-10-CM

## 2019-08-09 DIAGNOSIS — Z881 Allergy status to other antibiotic agents status: Secondary | ICD-10-CM | POA: Diagnosis not present

## 2019-08-09 DIAGNOSIS — Z6841 Body Mass Index (BMI) 40.0 and over, adult: Secondary | ICD-10-CM

## 2019-08-09 DIAGNOSIS — Z888 Allergy status to other drugs, medicaments and biological substances status: Secondary | ICD-10-CM | POA: Diagnosis not present

## 2019-08-09 DIAGNOSIS — Z803 Family history of malignant neoplasm of breast: Secondary | ICD-10-CM | POA: Diagnosis not present

## 2019-08-09 DIAGNOSIS — K449 Diaphragmatic hernia without obstruction or gangrene: Secondary | ICD-10-CM | POA: Diagnosis present

## 2019-08-09 DIAGNOSIS — K219 Gastro-esophageal reflux disease without esophagitis: Secondary | ICD-10-CM | POA: Diagnosis present

## 2019-08-09 DIAGNOSIS — Z9884 Bariatric surgery status: Secondary | ICD-10-CM

## 2019-08-09 DIAGNOSIS — Z811 Family history of alcohol abuse and dependence: Secondary | ICD-10-CM | POA: Diagnosis not present

## 2019-08-09 DIAGNOSIS — Z20828 Contact with and (suspected) exposure to other viral communicable diseases: Secondary | ICD-10-CM | POA: Diagnosis present

## 2019-08-09 HISTORY — PX: LAPAROSCOPIC GASTRIC SLEEVE RESECTION: SHX5895

## 2019-08-09 LAB — COMPREHENSIVE METABOLIC PANEL
ALT: 33 U/L (ref 0–44)
AST: 31 U/L (ref 15–41)
Albumin: 3.9 g/dL (ref 3.5–5.0)
Alkaline Phosphatase: 58 U/L (ref 38–126)
Anion gap: 12 (ref 5–15)
BUN: 18 mg/dL (ref 6–20)
CO2: 24 mmol/L (ref 22–32)
Calcium: 9.6 mg/dL (ref 8.9–10.3)
Chloride: 104 mmol/L (ref 98–111)
Creatinine, Ser: 0.77 mg/dL (ref 0.44–1.00)
GFR calc Af Amer: >60 mL/min (ref >60)
GFR calc non Af Amer: >60 mL/min (ref >60)
Glucose, Bld: 109 mg/dL — ABNORMAL HIGH (ref 70–99)
Potassium: 3.7 mmol/L (ref 3.5–5.1)
Sodium: 140 mmol/L (ref 135–145)
Total Bilirubin: 0.8 mg/dL (ref 0.3–1.2)
Total Protein: 7.2 g/dL (ref 6.5–8.1)

## 2019-08-09 LAB — CBC WITH DIFFERENTIAL/PLATELET
Abs Immature Granulocytes: 0.02 10*3/uL (ref 0.00–0.07)
Basophils Absolute: 0 10*3/uL (ref 0.0–0.1)
Basophils Relative: 0 %
Eosinophils Absolute: 0 10*3/uL (ref 0.0–0.5)
Eosinophils Relative: 1 %
HCT: 41.7 % (ref 36.0–46.0)
Hemoglobin: 13.6 g/dL (ref 12.0–15.0)
Immature Granulocytes: 0 %
Lymphocytes Relative: 27 %
Lymphs Abs: 1.9 10*3/uL (ref 0.7–4.0)
MCH: 30.5 pg (ref 26.0–34.0)
MCHC: 32.6 g/dL (ref 30.0–36.0)
MCV: 93.5 fL (ref 80.0–100.0)
Monocytes Absolute: 0.5 10*3/uL (ref 0.1–1.0)
Monocytes Relative: 6 %
Neutro Abs: 4.7 10*3/uL (ref 1.7–7.7)
Neutrophils Relative %: 66 %
Platelets: 228 10*3/uL (ref 150–400)
RBC: 4.46 MIL/uL (ref 3.87–5.11)
RDW: 14 % (ref 11.5–15.5)
WBC: 7.2 10*3/uL (ref 4.0–10.5)
nRBC: 0 % (ref 0.0–0.2)

## 2019-08-09 LAB — PREGNANCY, URINE: Preg Test, Ur: NEGATIVE

## 2019-08-09 LAB — ABO/RH: ABO/RH(D): A POS

## 2019-08-09 SURGERY — GASTRECTOMY, SLEEVE, LAPAROSCOPIC
Anesthesia: General

## 2019-08-09 MED ORDER — LACTATED RINGERS IR SOLN
Status: DC | PRN
  Administered 2019-08-09: 1000 mL

## 2019-08-09 MED ORDER — CELECOXIB 200 MG PO CAPS
400.0000 mg | ORAL_CAPSULE | Freq: Once | ORAL | Status: AC
  Administered 2019-08-09: 400 mg via ORAL
  Filled 2019-08-09: qty 2

## 2019-08-09 MED ORDER — ROCURONIUM BROMIDE 10 MG/ML (PF) SYRINGE
PREFILLED_SYRINGE | INTRAVENOUS | Status: DC | PRN
  Administered 2019-08-09: 80 mg via INTRAVENOUS

## 2019-08-09 MED ORDER — PROMETHAZINE HCL 25 MG/ML IJ SOLN
6.2500 mg | INTRAMUSCULAR | Status: AC | PRN
  Administered 2019-08-09: 12.5 mg via INTRAVENOUS
  Administered 2019-08-09: 6.25 mg via INTRAVENOUS

## 2019-08-09 MED ORDER — KCL IN DEXTROSE-NACL 20-5-0.45 MEQ/L-%-% IV SOLN
INTRAVENOUS | Status: DC
  Filled 2019-08-09 (×2): qty 1000

## 2019-08-09 MED ORDER — GABAPENTIN 300 MG PO CAPS
300.0000 mg | ORAL_CAPSULE | ORAL | Status: AC
  Administered 2019-08-09: 300 mg via ORAL
  Filled 2019-08-09: qty 1

## 2019-08-09 MED ORDER — HEPARIN SODIUM (PORCINE) 5000 UNIT/ML IJ SOLN
5000.0000 [IU] | Freq: Three times a day (TID) | INTRAMUSCULAR | Status: DC
  Administered 2019-08-09 – 2019-08-10 (×2): 5000 [IU] via SUBCUTANEOUS
  Filled 2019-08-09 (×2): qty 1

## 2019-08-09 MED ORDER — SODIUM CHLORIDE (PF) 0.9 % IJ SOLN
INTRAMUSCULAR | Status: AC
  Filled 2019-08-09: qty 10

## 2019-08-09 MED ORDER — BUPIVACAINE LIPOSOME 1.3 % IJ SUSP
INTRAMUSCULAR | Status: DC | PRN
  Administered 2019-08-09: 20 mL

## 2019-08-09 MED ORDER — SODIUM CHLORIDE (PF) 0.9 % IJ SOLN
INTRAMUSCULAR | Status: DC | PRN
  Administered 2019-08-09: 10 mL

## 2019-08-09 MED ORDER — BUPIVACAINE LIPOSOME 1.3 % IJ SUSP
20.0000 mL | Freq: Once | INTRAMUSCULAR | Status: DC
  Filled 2019-08-09: qty 20

## 2019-08-09 MED ORDER — PROPOFOL 10 MG/ML IV BOLUS
INTRAVENOUS | Status: AC
  Filled 2019-08-09: qty 20

## 2019-08-09 MED ORDER — ROCURONIUM BROMIDE 10 MG/ML (PF) SYRINGE
PREFILLED_SYRINGE | INTRAVENOUS | Status: AC
  Filled 2019-08-09: qty 10

## 2019-08-09 MED ORDER — ACETAMINOPHEN 500 MG PO TABS
1000.0000 mg | ORAL_TABLET | Freq: Once | ORAL | Status: AC
  Filled 2019-08-09: qty 2

## 2019-08-09 MED ORDER — ENSURE MAX PROTEIN PO LIQD
2.0000 [oz_av] | ORAL | Status: DC
  Administered 2019-08-10 (×2): 2 [oz_av] via ORAL

## 2019-08-09 MED ORDER — FENTANYL CITRATE (PF) 100 MCG/2ML IJ SOLN: 25.0000 ug | INTRAMUSCULAR | Status: DC | PRN

## 2019-08-09 MED ORDER — ONDANSETRON HCL 4 MG/2ML IJ SOLN
INTRAMUSCULAR | Status: DC | PRN
  Administered 2019-08-09: 4 mg via INTRAVENOUS

## 2019-08-09 MED ORDER — KETAMINE HCL 10 MG/ML IJ SOLN
INTRAMUSCULAR | Status: DC | PRN
  Administered 2019-08-09: 30 mg via INTRAVENOUS

## 2019-08-09 MED ORDER — ONDANSETRON HCL 4 MG/2ML IJ SOLN
INTRAMUSCULAR | Status: AC
  Filled 2019-08-09: qty 2

## 2019-08-09 MED ORDER — PROMETHAZINE HCL 25 MG/ML IJ SOLN
INTRAMUSCULAR | Status: AC
  Filled 2019-08-09: qty 1

## 2019-08-09 MED ORDER — OXYCODONE HCL 5 MG/5ML PO SOLN
5.0000 mg | Freq: Four times a day (QID) | ORAL | Status: DC | PRN
  Administered 2019-08-10: 5 mg via ORAL
  Filled 2019-08-09: qty 5

## 2019-08-09 MED ORDER — FENTANYL CITRATE (PF) 250 MCG/5ML IJ SOLN
INTRAMUSCULAR | Status: DC | PRN
  Administered 2019-08-09: 100 ug via INTRAVENOUS

## 2019-08-09 MED ORDER — FENTANYL CITRATE (PF) 100 MCG/2ML IJ SOLN
INTRAMUSCULAR | Status: AC
  Filled 2019-08-09: qty 2

## 2019-08-09 MED ORDER — LIDOCAINE 2% (20 MG/ML) 5 ML SYRINGE
INTRAMUSCULAR | Status: AC
  Filled 2019-08-09: qty 5

## 2019-08-09 MED ORDER — MORPHINE SULFATE (PF) 2 MG/ML IV SOLN: 1.0000 mg | INTRAVENOUS | Status: DC | PRN

## 2019-08-09 MED ORDER — SUGAMMADEX SODIUM 200 MG/2ML IV SOLN
INTRAVENOUS | Status: DC | PRN
  Administered 2019-08-09: 400 mg via INTRAVENOUS

## 2019-08-09 MED ORDER — EPHEDRINE 5 MG/ML INJ
INTRAVENOUS | Status: AC
  Filled 2019-08-09: qty 10

## 2019-08-09 MED ORDER — LIDOCAINE 2% (20 MG/ML) 5 ML SYRINGE
INTRAMUSCULAR | Status: DC | PRN
  Administered 2019-08-09: 80 mg via INTRAVENOUS

## 2019-08-09 MED ORDER — APREPITANT 40 MG PO CAPS
40.0000 mg | ORAL_CAPSULE | ORAL | Status: AC
  Administered 2019-08-09: 40 mg via ORAL
  Filled 2019-08-09: qty 1

## 2019-08-09 MED ORDER — DEXAMETHASONE SODIUM PHOSPHATE 4 MG/ML IJ SOLN
INTRAMUSCULAR | Status: DC | PRN
  Administered 2019-08-09: 8 mg via INTRAVENOUS

## 2019-08-09 MED ORDER — METOPROLOL TARTRATE 5 MG/5ML IV SOLN: 5.0000 mg | Freq: Four times a day (QID) | INTRAVENOUS | Status: DC | PRN

## 2019-08-09 MED ORDER — KETAMINE HCL 10 MG/ML IJ SOLN
INTRAMUSCULAR | Status: AC
  Filled 2019-08-09: qty 1

## 2019-08-09 MED ORDER — SODIUM CHLORIDE 0.9 % IV SOLN
2.0000 g | INTRAVENOUS | Status: AC
  Administered 2019-08-09: 2 g via INTRAVENOUS
  Filled 2019-08-09: qty 2

## 2019-08-09 MED ORDER — MIDAZOLAM HCL 5 MG/5ML IJ SOLN
INTRAMUSCULAR | Status: DC | PRN
  Administered 2019-08-09: 2 mg via INTRAVENOUS

## 2019-08-09 MED ORDER — SCOPOLAMINE 1 MG/3DAYS TD PT72
1.0000 | MEDICATED_PATCH | TRANSDERMAL | Status: DC
  Filled 2019-08-09: qty 1

## 2019-08-09 MED ORDER — LACTATED RINGERS IV SOLN: INTRAVENOUS | Status: DC

## 2019-08-09 MED ORDER — CHLORHEXIDINE GLUCONATE CLOTH 2 % EX PADS: 6.0000 | MEDICATED_PAD | Freq: Once | CUTANEOUS | Status: DC

## 2019-08-09 MED ORDER — ONDANSETRON HCL 4 MG/2ML IJ SOLN: 4.0000 mg | INTRAMUSCULAR | Status: DC | PRN

## 2019-08-09 MED ORDER — HEPARIN SODIUM (PORCINE) 5000 UNIT/ML IJ SOLN
5000.0000 [IU] | INTRAMUSCULAR | Status: AC
  Administered 2019-08-09: 5000 [IU] via SUBCUTANEOUS
  Filled 2019-08-09: qty 1

## 2019-08-09 MED ORDER — SCOPOLAMINE 1 MG/3DAYS TD PT72
1.0000 | MEDICATED_PATCH | TRANSDERMAL | Status: DC
  Administered 2019-08-09: 1.5 mg via TRANSDERMAL

## 2019-08-09 MED ORDER — EPHEDRINE SULFATE-NACL 50-0.9 MG/10ML-% IV SOSY
PREFILLED_SYRINGE | INTRAVENOUS | Status: DC | PRN
  Administered 2019-08-09: 5 mg via INTRAVENOUS
  Administered 2019-08-09: 15 mg via INTRAVENOUS

## 2019-08-09 MED ORDER — ACETAMINOPHEN 500 MG PO TABS
1000.0000 mg | ORAL_TABLET | Freq: Three times a day (TID) | ORAL | Status: DC
  Administered 2019-08-09 – 2019-08-10 (×2): 1000 mg via ORAL
  Filled 2019-08-09 (×2): qty 2

## 2019-08-09 MED ORDER — MIDAZOLAM HCL 2 MG/2ML IJ SOLN
INTRAMUSCULAR | Status: AC
  Filled 2019-08-09: qty 2

## 2019-08-09 MED ORDER — ACETAMINOPHEN 500 MG PO TABS
1000.0000 mg | ORAL_TABLET | ORAL | Status: AC
  Administered 2019-08-09: 1000 mg via ORAL

## 2019-08-09 MED ORDER — PANTOPRAZOLE SODIUM 40 MG IV SOLR
40.0000 mg | Freq: Every day | INTRAVENOUS | Status: DC
  Administered 2019-08-09: 40 mg via INTRAVENOUS
  Filled 2019-08-09: qty 40

## 2019-08-09 MED ORDER — ACETAMINOPHEN 160 MG/5ML PO SOLN: 1000.0000 mg | Freq: Three times a day (TID) | ORAL | Status: DC

## 2019-08-09 MED ORDER — PROPOFOL 10 MG/ML IV BOLUS
INTRAVENOUS | Status: DC | PRN
  Administered 2019-08-09: 200 mg via INTRAVENOUS

## 2019-08-09 MED ORDER — DEXAMETHASONE SODIUM PHOSPHATE 10 MG/ML IJ SOLN
INTRAMUSCULAR | Status: AC
  Filled 2019-08-09: qty 1

## 2019-08-09 SURGICAL SUPPLY — 67 items
ADH SKN CLS APL DERMABOND .7 (GAUZE/BANDAGES/DRESSINGS) ×1
APL SWBSTK 6 STRL LF DISP (MISCELLANEOUS)
APPLICATOR COTTON TIP 6 STRL (MISCELLANEOUS) IMPLANT
APPLICATOR COTTON TIP 6IN STRL (MISCELLANEOUS)
APPLIER CLIP 5 13 M/L LIGAMAX5 (MISCELLANEOUS)
APPLIER CLIP ROT 10 11.4 M/L (STAPLE)
APPLIER CLIP ROT 13.4 12 LRG (CLIP)
APR CLP LRG 13.4X12 ROT 20 MLT (CLIP)
APR CLP MED LRG 11.4X10 (STAPLE)
APR CLP MED LRG 5 ANG JAW (MISCELLANEOUS)
BLADE SURG 15 STRL LF DISP TIS (BLADE) ×1 IMPLANT
BLADE SURG 15 STRL SS (BLADE) ×2
CABLE HIGH FREQUENCY MONO STRZ (ELECTRODE) ×2 IMPLANT
CLIP APPLIE 5 13 M/L LIGAMAX5 (MISCELLANEOUS) IMPLANT
CLIP APPLIE ROT 10 11.4 M/L (STAPLE) IMPLANT
CLIP APPLIE ROT 13.4 12 LRG (CLIP) IMPLANT
COVER WAND RF STERILE (DRAPES) IMPLANT
DERMABOND ADVANCED (GAUZE/BANDAGES/DRESSINGS) ×1
DERMABOND ADVANCED .7 DNX12 (GAUZE/BANDAGES/DRESSINGS) IMPLANT
DEVICE SUT QUICK LOAD TK 5 (STAPLE) IMPLANT
DEVICE SUT TI-KNOT TK 5X26 (MISCELLANEOUS) IMPLANT
DEVICE SUTURE ENDOST 10MM (ENDOMECHANICALS) IMPLANT
DISSECTOR BLUNT TIP ENDO 5MM (MISCELLANEOUS) IMPLANT
ELECT REM PT RETURN 15FT ADLT (MISCELLANEOUS) ×2 IMPLANT
GAUZE SPONGE 4X4 12PLY STRL (GAUZE/BANDAGES/DRESSINGS) IMPLANT
GLOVE BIOGEL M 8.0 STRL (GLOVE) ×2 IMPLANT
GOWN STRL REUS W/TWL XL LVL3 (GOWN DISPOSABLE) ×8 IMPLANT
GRASPER SUT TROCAR 14GX15 (MISCELLANEOUS) ×2 IMPLANT
HANDLE STAPLE EGIA 4 XL (STAPLE) ×2 IMPLANT
HOVERMATT SINGLE USE (MISCELLANEOUS) ×2 IMPLANT
KIT BASIN OR (CUSTOM PROCEDURE TRAY) ×2 IMPLANT
KIT TURNOVER KIT A (KITS) IMPLANT
MARKER SKIN DUAL TIP RULER LAB (MISCELLANEOUS) ×2 IMPLANT
NDL SPNL 22GX3.5 QUINCKE BK (NEEDLE) ×1 IMPLANT
NEEDLE SPNL 22GX3.5 QUINCKE BK (NEEDLE) ×2 IMPLANT
PACK UNIVERSAL I (CUSTOM PROCEDURE TRAY) ×2 IMPLANT
PENCIL SMOKE EVACUATOR (MISCELLANEOUS) IMPLANT
RELOAD STAPLE 45 PURP MED/THCK (STAPLE) IMPLANT
RELOAD TRI 45 ART MED THCK BLK (STAPLE) ×2 IMPLANT
RELOAD TRI 45 ART MED THCK PUR (STAPLE) ×2 IMPLANT
RELOAD TRI 60 ART MED THCK BLK (STAPLE) ×2 IMPLANT
RELOAD TRI 60 ART MED THCK PUR (STAPLE) ×2 IMPLANT
SCISSORS LAP 5X45 EPIX DISP (ENDOMECHANICALS) IMPLANT
SET IRRIG TUBING LAPAROSCOPIC (IRRIGATION / IRRIGATOR) ×2 IMPLANT
SET TUBE SMOKE EVAC HIGH FLOW (TUBING) ×2 IMPLANT
SHEARS HARMONIC ACE PLUS 45CM (MISCELLANEOUS) ×2 IMPLANT
SLEEVE ADV FIXATION 5X100MM (TROCAR) ×4 IMPLANT
SLEEVE GASTRECTOMY 36FR VISIGI (MISCELLANEOUS) ×2 IMPLANT
SOL ANTI FOG 6CC (MISCELLANEOUS) ×1 IMPLANT
SOLUTION ANTI FOG 6CC (MISCELLANEOUS) ×1
SPONGE LAP 18X18 RF (DISPOSABLE) ×2 IMPLANT
STAPLER VISISTAT 35W (STAPLE) ×2 IMPLANT
SUT MNCRL AB 4-0 PS2 18 (SUTURE) ×4 IMPLANT
SUT SURGIDAC NAB ES-9 0 48 120 (SUTURE) IMPLANT
SUT VICRYL 0 TIES 12 18 (SUTURE) ×2 IMPLANT
SYR 10ML ECCENTRIC (SYRINGE) ×2 IMPLANT
SYR 20ML LL LF (SYRINGE) ×2 IMPLANT
SYR 50ML LL SCALE MARK (SYRINGE) ×2 IMPLANT
TOWEL OR 17X26 10 PK STRL BLUE (TOWEL DISPOSABLE) ×4 IMPLANT
TOWEL OR NON WOVEN STRL DISP B (DISPOSABLE) ×2 IMPLANT
TRAY FOLEY MTR SLVR 16FR STAT (SET/KITS/TRAYS/PACK) IMPLANT
TROCAR ADV FIXATION 5X100MM (TROCAR) ×2 IMPLANT
TROCAR BLADELESS 15MM (ENDOMECHANICALS) ×2 IMPLANT
TROCAR BLADELESS OPT 5 100 (ENDOMECHANICALS) ×2 IMPLANT
TUBE CALIBRATION LAPBAND (TUBING) ×1 IMPLANT
TUBING CONNECTING 10 (TUBING) ×2 IMPLANT
TUBING ENDO SMARTCAP (MISCELLANEOUS) ×2 IMPLANT

## 2019-08-09 NOTE — Discharge Instructions (Signed)
° ° ° °GASTRIC BYPASS/SLEEVE ° Home Care Instructions ° ° These instructions are to help you care for yourself when you go home. ° °Call: If you have any problems. °• Call [phone] and ask for the surgeon on call °• If you need immediate help, come to the ER at [place].  °• Tell the ER staff that you are a new post-op gastric bypass or gastric sleeve patient °  °Signs and symptoms to report: • Severe vomiting or nausea °o If you cannot keep down clear liquids for longer than 1 day, call your surgeon  °• Abdominal pain that does not get better after taking your pain medication °• Fever over 100.4° F with chills °• Heart beating over 100 beats a minute °• Shortness of breath at rest °• Chest pain °•  Redness, swelling, drainage, or foul odor at incision (surgical) sites °•  If your incisions open or pull apart °• Swelling or pain in calf (lower leg) °• Diarrhea (Loose bowel movements that happen often), frequent watery, uncontrolled bowel movements °• Constipation, (no bowel movements for 3 days) if this happens: Pick one °o Milk of Magnesia, 2 tablespoons by mouth, 3 times a day for 2 days if needed °o Stop taking Milk of Magnesia once you have a bowel movement °o Call your doctor if constipation continues °Or °o Miralax  (instead of Milk of Magnesia) following the label instructions °o Stop taking Miralax once you have a bowel movement °o Call your doctor if constipation continues °• Anything you think is not normal °  °Normal side effects after surgery: • Unable to sleep at night or unable to focus °• Irritability or moody °• Being tearful (crying) or depressed °These are common complaints, possibly related to your anesthesia medications that put you to sleep, stress of surgery, and change in lifestyle.  This usually goes away a few weeks after surgery.  If these feelings continue, call your primary care doctor. °  °Wound Care: You may have surgical glue, steri-strips, or staples over your incisions after  surgery °• Surgical glue:  Looks like a clear film over your incisions and will wear off a little at a time °• Steri-strips: Strips of tape over your incisions. You may notice a yellowish color on the skin under the steri-strips. This is used to make the   steri-strips stick better. Do not pull the steri-strips off - let them fall off °• Staples: Staples may be removed before you leave the hospital °o If you go home with staples, call [facility], [phone] at for an appointment with your surgeon’s nurse to have staples removed 10 days after surgery. °• Showering: You may shower two (2) days after your surgery unless your surgeon tells you differently °o Wash gently around incisions with warm soapy water, rinse well, and gently pat dry  °o No tub baths until staples are removed, steri-strips fall off or glue is gone.  °  °Medications: • Medications should be liquid or crushed if larger than the size of a dime °• Extended release pills (medication that release a little bit at a time through the day) should NOT be crushed or cut. (examples include XL, ER, DR, SR) °• Depending on the size and number of medications you take, you may need to space (take a few throughout the day)/change the time you take your medications so that you do not over-fill your pouch (smaller stomach) °• Make sure you follow-up with your primary care doctor to   make medication changes needed during rapid weight loss and life-style changes °• If you have diabetes, follow up with the doctor that orders your diabetes medication(s) within one week after surgery and check your blood sugar regularly. °• Do not drive while taking prescription pain medication  °• It is ok to take Tylenol by the bottle instructions with your pain medicine or instead of your pain medicine as needed.  DO NOT TAKE NSAIDS (EXAMPLES OF NSAIDS:  IBUPROFREN/ NAPROXEN)  °Diet:                    First 2 Weeks ° You will see the dietician t about two (2) weeks  after your surgery. The dietician will increase the types of foods you can eat if you are handling liquids well: °• If you have severe vomiting or nausea and cannot keep down clear liquids lasting longer than 1 day, call your surgeon @ ([phone]) °Protein Shake °• Drink at least 2 ounces of shake 5-6 times per day °• Each serving of protein shakes (usually 8 - 12 ounces) should have: °o 15 grams of protein  °o And no more than 5 grams of carbohydrate  °• Goal for protein each day: °o Men = 80 grams per day °o Women = 60 grams per day °• Protein powder may be added to fluids such as non-fat milk or Lactaid milk or unsweetened Soy/Almond milk (limit to 35 grams added protein powder per serving) ° °Hydration °• Slowly increase the amount of water and other clear liquids as tolerated (See Acceptable Fluids) °• Slowly increase the amount of protein shake as tolerated  °•  Sip fluids slowly and throughout the day.  Do not use straws. °• May use sugar substitutes in small amounts (no more than 6 - 8 packets per day; i.e. Splenda) ° °Fluid Goal °• The first goal is to drink at least 8 ounces of protein shake/drink per day (or as directed by the nutritionist); some examples of protein shakes are Syntrax Nectar, Adkins Advantage, EAS Edge HP, and Unjury. See handout from pre-op Bariatric Education Class: °o Slowly increase the amount of protein shake you drink as tolerated °o You may find it easier to slowly sip shakes throughout the day °o It is important to get your proteins in first °• Your fluid goal is to drink 64 - 100 ounces of fluid daily °o It may take a few weeks to build up to this °• 32 oz (or more) should be clear liquids  °And  °• 32 oz (or more) should be full liquids (see below for examples) °• Liquids should not contain sugar, caffeine, or carbonation ° °Clear Liquids: °• Water or Sugar-free flavored water (i.e. Fruit H2O, Propel) °• Decaffeinated coffee or tea (sugar-free) °• Crystal Lite, Wyler’s Lite,  Minute Maid Lite °• Sugar-free Jell-O °• Bouillon or broth °• Sugar-free Popsicle:   *Less than 20 calories each; Limit 1 per day ° °Full Liquids: °Protein Shakes/Drinks + 2 choices per day of other full liquids °• Full liquids must be: °o No More Than 15 grams of Carbs per serving  °o No More Than 3 grams of Fat per serving °• Strained low-fat cream soup (except Cream of Potato or Tomato) °• Non-Fat milk °• Fat-free Lactaid Milk °• Unsweetened Soy Or Unsweetened Almond Milk °• Low Sugar yogurt (Dannon Lite & Fit, Greek yogurt; Oikos Triple Zero; Chobani Simply 100; Yoplait 100 calorie Greek - No Fruit on the Bottom) ° °  °Vitamins   and Minerals • Start 1 day after surgery unless otherwise directed by your surgeon °• 2 Chewable Bariatric Specific Multivitamin / Multimineral Supplement with iron (Example: Bariatric Advantage Multi EA) °• Chewable Calcium with Vitamin D-3 °(Example: 3 Chewable Calcium Plus 600 with Vitamin D-3) °o Take 500 mg three (3) times a day for a total of 1500 mg each day °o Do not take all 3 doses of calcium at one time as it may cause constipation, and you can only absorb 500 mg  at a time  °o Do not mix multivitamins containing iron with calcium supplements; take 2 hours apart °• Menstruating women and those with a history of anemia (a blood disease that causes weakness) may need extra iron °o Talk with your doctor to see if you need more iron °• Do not stop taking or change any vitamins or minerals until you talk to your dietitian or surgeon °• Your Dietitian and/or surgeon must approve all vitamin and mineral supplements °  °Activity and Exercise: Limit your physical activity as instructed by your doctor.  It is important to continue walking at home.  During this time, use these guidelines: °• Do not lift anything greater than ten (10) pounds for at least two (2) weeks °• Do not go back to work or drive until your surgeon says you can °• You may have sex when you feel comfortable  °o It is  VERY important for female patients to use a reliable birth control method; fertility often increases after surgery  °o All hormonal birth control will be ineffective for 30 days after surgery due to medications given during surgery a barrier method must be used. °o Do not get pregnant for at least 18 months °• Start exercising as soon as your doctor tells you that you can °o Make sure your doctor approves any physical activity °• Start with a simple walking program °• Walk 5-15 minutes each day, 7 days per week.  °• Slowly increase until you are walking 30-45 minutes per day °Consider joining our BELT program. [phone] or email [email or web address].[email or web address] °  °Special Instructions Things to remember: °• Use your CPAP when sleeping if this applies to you ° °• [facility] has two free Bariatric Surgery Support Groups that meet monthly °o The 3rd Thursday of each month, 6 pm, [facility]  °o The 2nd Friday of each month, 11:45 am in the private dining room in the basement of [facility] °• It is very important to keep all follow up appointments with your surgeon, dietitian, primary care physician, and behavioral health practitioner °• Routine follow up schedule with your surgeon include appointments at 2-3 weeks, 6-8 weeks, 6 months, and 1 year at a minimum.  Your surgeon may request to see you more often.   °o After the first year, please follow up with your bariatric surgeon and dietitian at least once a year in order to maintain best weight loss results °[facility]: [phone] °[facility]: [phone] °Bariatric Nurse Coordinator: [phone] °  °   Reviewed and Endorsed  °by [facility] Patient Education Committee, June, 2016 °Edits Approved: Aug, 2018 ° ° ° °

## 2019-08-09 NOTE — Anesthesia Preprocedure Evaluation (Addendum)
Anesthesia Evaluation  Patient identified by MRN, date of birth, ID band Patient awake    Reviewed: Allergy & Precautions, NPO status , Patient's Chart, lab work & pertinent test results  History of Anesthesia Complications Negative for: history of anesthetic complications  Airway Mallampati: III  TM Distance: >3 FB Neck ROM: Full    Dental no notable dental hx. (+) Dental Advisory Given   Pulmonary COPD, former smoker,    Pulmonary exam normal  (-) decreased breath sounds      Cardiovascular Exercise Tolerance: Good hypertension, Normal cardiovascular exam     Neuro/Psych  Headaches, PSYCHIATRIC DISORDERS Anxiety Depression    GI/Hepatic negative GI ROS, Neg liver ROS,   Endo/Other  Morbid obesity  Renal/GU negative Renal ROS     Musculoskeletal   Abdominal (+) + obese,   Peds negative pediatric ROS (+)  Hematology negative hematology ROS (+)   Anesthesia Other Findings   Reproductive/Obstetrics                            Anesthesia Physical  Anesthesia Plan  ASA: III  Anesthesia Plan: General   Post-op Pain Management:    Induction: Intravenous  PONV Risk Score and Plan: 4 or greater and Ondansetron, Dexamethasone, Scopolamine patch - Pre-op and Midazolam  Airway Management Planned: Oral ETT  Additional Equipment:   Intra-op Plan:   Post-operative Plan: Extubation in OR  Informed Consent: I have reviewed the patients History and Physical, chart, labs and discussed the procedure including the risks, benefits and alternatives for the proposed anesthesia with the patient or authorized representative who has indicated his/her understanding and acceptance.     Dental advisory given  Plan Discussed with: CRNA and Anesthesiologist  Anesthesia Plan Comments:        Anesthesia Quick Evaluation

## 2019-08-09 NOTE — Transfer of Care (Addendum)
Immediate Anesthesia Transfer of Care Note  Patient: Ariana Weeks  Procedure(s) Performed: LAPAROSCOPIC GASTRIC SLEEVE RESECTION, Upper Endo, ERAS Pathaway (N/A )  Patient Location: PACU  Anesthesia Type:General  Level of Consciousness: awake, alert  and oriented  Airway & Oxygen Therapy: Patient Spontanous Breathing and Patient connected to face mask oxygen  Post-op Assessment: Report given to RN and Post -op Vital signs reviewed and stable  Post vital signs: Reviewed and stable  Last Vitals:  Vitals Value Taken Time  BP 155/91 08/09/19 1454  Temp    Pulse 84 08/09/19 1459  Resp 23 08/09/19 1459  SpO2 98 % 08/09/19 1459  Vitals shown include unvalidated device data.  Last Pain:  Vitals:   08/09/19 1059  TempSrc: Oral  PainSc:       Patients Stated Pain Goal: 4 (75/10/25 8527)  Complications: No apparent anesthesia complications

## 2019-08-09 NOTE — Progress Notes (Signed)
PHARMACY CONSULT FOR:  Risk Assessment for Post-Discharge VTE Following Bariatric Surgery  Post-Discharge VTE Risk Assessment: This patient's probability of 30-day post-discharge VTE is increased due to the factors marked:   Female    Age >/=60 years    BMI >/=50 kg/m2    CHF    Dyspnea at Rest    Paraplegia   x Non-gastric-band surgery    Operation Time >/=3 hr    Return to OR     Length of Stay >/= 3 d      Hx of VTE   Hypercoagulable condition   Significant venous stasis   Predicted probability of 30-day post-discharge VTE: 0.16%  Recommendation for Discharge: No pharmacologic prophylaxis post-discharge   Ariana Weeks is a 48 y.o. female who underwent  laparoscopic gastric sleeve resection on 08/09/19   Case start: 1342 Case end: 1442   Allergies  Allergen Reactions  . Dilaudid [Hydromorphone Hcl] Itching    Red face and itching  . Azithromycin     Other reaction(s): Unknown  . Nitrofurantoin Hives    Macrobid  . Morphine And Related Hives and Rash    Patient Measurements: Height: 5' 2"  (157.5 cm) Weight: 221 lb 12.8 oz (100.6 kg) IBW/kg (Calculated) : 50.1 Body mass index is 40.57 kg/m.  Recent Labs    08/09/19 1040  WBC 7.2  HGB 13.6  HCT 41.7  PLT 228  CREATININE 0.77  ALBUMIN 3.9  PROT 7.2  AST 31  ALT 33  ALKPHOS 58  BILITOT 0.8   Estimated Creatinine Clearance: 97.5 mL/min (by C-G formula based on SCr of 0.77 mg/dL).    Past Medical History:  Diagnosis Date  . BRCA negative 02/2019   MyRisk neg except AXIN2 VUS  . Depression   . Head cold   . History of frequent urinary tract infections   . History of kidney stones   . Hypertension   . Increased risk of breast cancer 02/2019   IBIS=66% due to hx of lobular CIS  . Kidney stones   . Left ureteral calculus   . 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 [name]  . Sleep apnea    no- c-pap     Medications Prior to Admission  Medication Sig Dispense Refill Last  Dose  . buPROPion (WELLBUTRIN XL) 300 MG 24 hr tablet Take 1 tablet (300 mg total) by mouth daily. 90 tablet 1 08/09/2019 at 0730  . hydrochlorothiazide (HYDRODIURIL) 25 MG tablet Take 1 tablet (25 mg total) by mouth daily. 90 tablet 3 08/08/2019 at Unknown time  . metoprolol succinate (TOPROL-XL) 100 MG 24 hr tablet Take 1 tablet (100 mg total) by mouth daily. 90 tablet 1 08/09/2019 at 0730  . Multiple Vitamins-Minerals (BARIATRIC FUSION) CHEW Chew 1 each by mouth 2 (two) times daily.   Past Week at Unknown time  . PARoxetine (PAXIL) 10 MG tablet TAKE 1 TABLET(10 MG) BY MOUTH DAILY 90 tablet 1 08/08/2019 at Unknown time  . raloxifene (EVISTA) 60 MG tablet TAKE 1 TABLET(60 MG) BY MOUTH DAILY (Patient taking differently: Take 60 mg by mouth daily. ) 30 tablet 12 08/08/2019 at Unknown time  . traMADol (ULTRAM) 50 MG tablet Take 1 tablet (50 mg total) by mouth 3 (three) times daily as needed. 90 tablet 1 Past Week at Unknown time  . levonorgestrel (MIRENA) 20 MCG/24HR IUD 1 each by Intrauterine route once.   More than a month at Unknown time  . telmisartan (  MICARDIS) 40 MG tablet Take 1 tablet (40 mg total) by mouth at bedtime. 90 tablet 1        [place] 08/09/2019, 2:57 PM

## 2019-08-09 NOTE — Op Note (Signed)
09 August 2019  Surgeon: Kaylyn Lim, MD, FACS  Asst:  Alphonsa Overall, MD, FACS  Anes:  General endotracheal  Procedure: Laparoscopic sleeve gastrectomy and upper endoscopy  Diagnosis: Morbid obesity  Complications: none  EBL:   minimal cc  Description of Procedure:  The patient was take to OR 2 and given general anesthesia.  The abdomen was prepped with Chloroprep and draped sterilely.  A timeout was performed.  Access to the abdomen was achieved with a 5 mm Optiview.  Following insufflation, the state of the abdomen was found to be free of adhesions.  The ViSiGi 36Fr tube was inserted to deflate the stomach and was pulled back into the esophagus.    The pylorus was identified and we measured 5 cm back and marked the antrum.  At that point we began dissection to take down the greater curvature of the stomach using the Harmonic scalpel.  This dissection was taken all the way up to the left crus.  Posterior attachments of the stomach were also taken down.    The ViSiGi tube 36 Fr was then passed into the antrum and suction applied so that it was snug along the lessor curvature.  The "crow's foot" or incisura was identified.  The sleeve gastrectomy was begun using the [employer] stapler beginning with a 4.5 cm black load and then 6 cm black load then multiple 6 cm purple loads.  When the sleeve was complete the tube was taken off suction and insufflated briefly.  The tube was withdrawn.  Upper endoscopy was then performed by Dr. Lucia Gaskins.     The specimen was extracted through the 15 trocar site.  Local was provided by infiltrating with Exparel placed as a TAP block and closed 4-0 Monocryl and Dermabond.    Matt B. Hassell Done, [place], [facility], [place]

## 2019-08-09 NOTE — Interval H&P Note (Signed)
History and Physical Interval Note:  08/09/2019 12:39 PM  Ariana Weeks  has presented today for surgery, with the diagnosis of Morbid Obesity.  The various methods of treatment have been discussed with the patient and family. After consideration of risks, benefits and other options for treatment, the patient has consented to  Procedure(s): LAPAROSCOPIC GASTRIC SLEEVE RESECTION, Upper Endo, ERAS Pathaway (N/A) as a surgical intervention.  The patient's history has been reviewed, patient examined, no change in status, stable for surgery.  I have reviewed the patient's chart and labs.  Questions were answered to the patient's satisfaction.     Pedro Earls

## 2019-08-09 NOTE — Anesthesia Procedure Notes (Signed)
Procedure Name: Intubation Date/Time: 08/09/2019 1:10 PM Performed by: [place] Joanny Dupree C, CRNA Pre-anesthesia Checklist: Patient identified, Emergency Drugs available, Suction available and Patient being monitored Patient Re-evaluated:Patient Re-evaluated prior to induction Oxygen Delivery Method: Circle system utilized Preoxygenation: Pre-oxygenation with 100% oxygen Induction Type: IV induction Ventilation: Mask ventilation without difficulty Laryngoscope Size: Mac and 3 Grade View: Grade I Tube type: Oral Tube size: 7.0 mm Number of attempts: 1 Airway Equipment and Method: Stylet and Oral airway Placement Confirmation: ETT inserted through vocal cords under direct vision,  positive ETCO2 and breath sounds checked- equal and bilateral Secured at: 20 cm Tube secured with: Tape Dental Injury: Teeth and Oropharynx as per pre-operative assessment

## 2019-08-09 NOTE — Anesthesia Postprocedure Evaluation (Signed)
Anesthesia Post Note  Patient: Ariana Weeks  Procedure(s) Performed: LAPAROSCOPIC GASTRIC SLEEVE RESECTION, Upper Endo, ERAS Pathaway (N/A )     Patient location during evaluation: PACU Anesthesia Type: General Level of consciousness: sedated Pain management: pain level controlled Vital Signs Assessment: post-procedure vital signs reviewed and stable Respiratory status: spontaneous breathing and respiratory function stable Cardiovascular status: stable Postop Assessment: no apparent nausea or vomiting Anesthetic complications: no    Last Vitals:  Vitals:   08/09/19 1515 08/09/19 1530  BP: (!) 152/93 (!) 148/84  Pulse: 89 85  Resp: 19 (!) 23  Temp:  36.7 C  SpO2: 95% 95%    Last Pain:  Vitals:   08/09/19 1530  TempSrc:   PainSc: 0-No pain                 Treson Laura DANIEL

## 2019-08-09 NOTE — Op Note (Signed)
Name:  Ariana Weeks MRN: [id number] Date of Surgery: 08/09/2019  Preop Diagnosis:  Morbid Obesity  Postop Diagnosis:  Morbid Obesity (Weight - 100 kg, BMI - 40.5), S/P Gastric Sleeve resection  Procedure:  Upper endoscopy  (Intraoperative)  Surgeon:  Alphonsa Overall, M.D.  Anesthesia:  GET  Indications for procedure: Ariana Weeks is a 48 y.o. female whose primary care physician is [name], MD and has completed a gastric sleeve resection today for weight loss by Dr. Hassell Done.  I am doing an intraoperative upper endoscopy to evaluate the gastric pouch after the sleeve gastrectomy.  Operative Note: The patient is under general anesthesia.  Dr. Hassell Done is laparoscoping the patient while I do an upper endoscopy to evaluate the stomach pouch.  With the patient intubated, I passed the Olympus upper endoscope without difficulty down the esophagus.  The esophagus was unremarkable.  The esophago-gastric junction was at 37 cm.    The mucosa of the stomach looked viable and the staple line was intact without bleeding.  I advanced the scope to the pylorus, but did not go through it.  While I insufflated the stomach pouch with air, Dr. Hassell Done  flooded the upper abdomen with saline to put the gastric pouch under saline.  There was no bubbling or evidence of a leak.  There was no evidence of narrowing of the pouch and the gastric sleeve looked tubular.  The scope was then withdrawn.  The esophagus was unremarkable and the patient tolerated the endoscopy without difficulty.  Alphonsa Overall, MD, [facility] Surgery Office phone:  [phone]

## 2019-08-10 LAB — CBC WITH DIFFERENTIAL/PLATELET
Abs Immature Granulocytes: 0.08 10*3/uL — ABNORMAL HIGH (ref 0.00–0.07)
Basophils Absolute: 0 10*3/uL (ref 0.0–0.1)
Basophils Relative: 0 %
Eosinophils Absolute: 0 10*3/uL (ref 0.0–0.5)
Eosinophils Relative: 0 %
HCT: 38.3 % (ref 36.0–46.0)
Hemoglobin: 12.4 g/dL (ref 12.0–15.0)
Immature Granulocytes: 1 %
Lymphocytes Relative: 7 %
Lymphs Abs: 0.8 10*3/uL (ref 0.7–4.0)
MCH: 30.2 pg (ref 26.0–34.0)
MCHC: 32.4 g/dL (ref 30.0–36.0)
MCV: 93.4 fL (ref 80.0–100.0)
Monocytes Absolute: 0.3 10*3/uL (ref 0.1–1.0)
Monocytes Relative: 3 %
Neutro Abs: 9.8 10*3/uL — ABNORMAL HIGH (ref 1.7–7.7)
Neutrophils Relative %: 89 %
Platelets: 232 10*3/uL (ref 150–400)
RBC: 4.1 MIL/uL (ref 3.87–5.11)
RDW: 13.6 % (ref 11.5–15.5)
WBC: 11 10*3/uL — ABNORMAL HIGH (ref 4.0–10.5)
nRBC: 0 % (ref 0.0–0.2)

## 2019-08-10 LAB — TYPE AND SCREEN
ABO/RH(D): A POS
Antibody Screen: NEGATIVE

## 2019-08-10 MED ORDER — TRAMADOL HCL 50 MG PO TABS: 50.0000 mg | ORAL_TABLET | Freq: Four times a day (QID) | ORAL | 0 refills | Status: DC | PRN

## 2019-08-10 MED ORDER — ONDANSETRON HCL 4 MG PO TABS
4.0000 mg | ORAL_TABLET | Freq: Once | ORAL | Status: AC
  Administered 2019-08-10: 4 mg via ORAL
  Filled 2019-08-10: qty 1

## 2019-08-10 MED ORDER — PANTOPRAZOLE SODIUM 40 MG PO TBEC: 40.0000 mg | DELAYED_RELEASE_TABLET | Freq: Every day | ORAL | 0 refills | Status: DC

## 2019-08-10 MED ORDER — ONDANSETRON 4 MG PO TBDP: 4.0000 mg | ORAL_TABLET | Freq: Four times a day (QID) | ORAL | 0 refills | Status: DC | PRN

## 2019-08-10 NOTE — Progress Notes (Signed)
Nutrition Note  RD consulted for diet education for patient s/p bariatric surgery. Bariatric nurse coordinator providing education at this time.  If nutrition issues arise, please consult RD.   Leota Maka, MS, RD, LDN Inpatient Clinical Dietitian Pager: [phone] After Hours Pager: [phone]  

## 2019-08-10 NOTE — Progress Notes (Signed)
Patient alert and oriented, pain is controlled. Patient is tolerating fluids, advanced to protein shake today, patient is tolerating well.  Reviewed Gastric sleeve discharge instructions with patient and patient is able to articulate understanding.  Provided information on BELT program, Support Group and [facility] outpatient pharmacy. All questions answered, will continue to monitor.  Total fluid intake 780 Per dehydration protocol call back one week post op 

## 2019-08-10 NOTE — Plan of Care (Signed)
Pt was discharged home today. Instructions were reviewed with patient, and questions were answered. Pt was taken to main entrance via wheelchair by NT.  

## 2019-08-10 NOTE — Discharge Summary (Signed)
Physician Discharge Summary  Patient ID: Ariana Weeks MRN: [id number] DOB/AGE: 1971/05/04 48 y.o.  PCP: [name], MD  Admit date: 08/09/2019 Discharge date: 08/10/2019  Admission Diagnoses:  Morbid obesity  Discharge Diagnoses:  same Active Problems:   S/P laparoscopic sleeve gastrectomy   Surgery:  Laparoscopic sleeve gastrectomy  Discharged Condition: improved   Hospital Course:   Had surgery on Tuesday afternoon.  Was begun on liquids and advanced nicely and ready for discharge on Wednesday  Consults: none  Significant Diagnostic Studies: none    Discharge Exam: Blood pressure 137/86, pulse 79, temperature 98 F (36.7 C), temperature source Oral, resp. rate 16, height 5\' 2"  (1.575 m), weight 100.6 kg, SpO2 95 %. Incisions OK  Disposition: Discharge disposition: 01-Home or Self Care       Discharge Instructions    Ambulate hourly while awake   Complete by: As directed    Call MD for:  difficulty breathing, headache or visual disturbances   Complete by: As directed    Call MD for:  persistant dizziness or light-headedness   Complete by: As directed    Call MD for:  persistant nausea and vomiting   Complete by: As directed    Call MD for:  redness, tenderness, or signs of infection (pain, swelling, redness, odor or green/yellow discharge around incision site)   Complete by: As directed    Call MD for:  severe uncontrolled pain   Complete by: As directed    Call MD for:  temperature >101 F   Complete by: As directed    Diet bariatric full liquid   Complete by: As directed    Incentive spirometry   Complete by: As directed    Perform hourly while awake     Allergies as of 08/10/2019      Reactions   Dilaudid [hydromorphone Hcl] Itching   Red face and itching   Azithromycin    Other reaction(s): Unknown   Nitrofurantoin Hives   Macrobid   Morphine And Related Hives, Rash      Medication List    TAKE these medications   Bariatric Fusion  Chew Chew 1 each by mouth 2 (two) times daily.   buPROPion 300 MG 24 hr tablet Commonly known as: WELLBUTRIN XL Take 1 tablet (300 mg total) by mouth daily.   hydrochlorothiazide 25 MG tablet Commonly known as: HYDRODIURIL Take 1 tablet (25 mg total) by mouth daily. Notes to patient: Monitor Blood Pressure Daily and keep a log for primary care physician.  Monitor for symptoms of dehydration.  You may need to make changes to your medications with rapid weight loss.     levonorgestrel 20 MCG/24HR IUD Commonly known as: MIRENA 1 each by Intrauterine route once.   metoprolol succinate 100 MG 24 hr tablet Commonly known as: TOPROL-XL Take 1 tablet (100 mg total) by mouth daily. Notes to patient: Monitor Blood Pressure Daily and keep a log for primary care physician.  You may need to make changes to your medications with rapid weight loss.     ondansetron 4 MG disintegrating tablet Commonly known as: ZOFRAN-ODT Take 1 tablet (4 mg total) by mouth every 6 (six) hours as needed for nausea or vomiting.   pantoprazole 40 MG tablet Commonly known as: PROTONIX Take 1 tablet (40 mg total) by mouth daily.   PARoxetine 10 MG tablet Commonly known as: PAXIL TAKE 1 TABLET(10 MG) BY MOUTH DAILY   raloxifene 60 MG tablet Commonly known as: EVISTA TAKE 1  TABLET(60 MG) BY MOUTH DAILY What changed: See the new instructions.   telmisartan 40 MG tablet Commonly known as: MICARDIS Take 1 tablet (40 mg total) by mouth at bedtime.   traMADol 50 MG tablet Commonly known as: ULTRAM Take 1 tablet (50 mg total) by mouth 3 (three) times daily as needed. What changed: Another medication with the same name was added. Make sure you understand how and when to take each.   traMADol 50 MG tablet Commonly known as: ULTRAM Take 1 tablet (50 mg total) by mouth every 6 (six) hours as needed (pain). What changed: You were already taking a medication with the same name, and this prescription was added. Make  sure you understand how and when to take each.      Follow-up Information    Surgery, [place]. Go on 09/01/2019.   Specialty: General Surgery Why: at 9 Contact information: [address] [place] [address] [phone]        Hedda Slade, PA-C. Go on 10/06/2019.   Specialty: General Surgery Contact information: [address] [place] [address] [phone]           Signed: Valarie Merino 08/10/2019, 10:18 AM

## 2019-08-11 LAB — SURGICAL PATHOLOGY

## 2019-08-13 ENCOUNTER — Ambulatory Visit: Payer: BC Managed Care – PPO | Admitting: Professional

## 2019-08-15 ENCOUNTER — Telehealth (HOSPITAL_COMMUNITY): Payer: Self-pay

## 2019-08-15 NOTE — Telephone Encounter (Signed)
Patient called to discuss post bariatric surgery follow up questions.  See below: ° ° °1.  Tell me about your pain and pain management? ° °2.  Let's talk about fluid intake.  How much total fluid are you taking in? ° °3.  How much protein have you taken in the last 2 days? ° °4.  Have you had nausea?  Tell me about when have experienced nausea and what you did to help? ° °5.  Has the frequency or color changed with your urine? ° °6.  Tell me what your incisions look like? ° °7.  Have you been passing gas? BM? ° °8.  If a problem or question were to arise who would you call?  Do you know contact numbers for [facility], [facility], and [facility]? ° °9.  How has the walking going? ° °10.  How are your vitamins and calcium going?  How are you taking them? °

## 2019-08-17 ENCOUNTER — Encounter: Payer: Self-pay | Admitting: *Deleted

## 2019-08-24 ENCOUNTER — Other Ambulatory Visit: Payer: Self-pay

## 2019-08-24 ENCOUNTER — Encounter: Payer: BC Managed Care – PPO | Attending: Surgery | Admitting: Dietician

## 2019-08-24 ENCOUNTER — Encounter: Payer: Self-pay | Admitting: Dietician

## 2019-08-24 VITALS — Ht 62.0 in | Wt 208.5 lb

## 2019-08-24 DIAGNOSIS — E6609 Other obesity due to excess calories: Secondary | ICD-10-CM | POA: Diagnosis not present

## 2019-08-24 DIAGNOSIS — Z6838 Body mass index (BMI) 38.0-38.9, adult: Secondary | ICD-10-CM | POA: Diagnosis not present

## 2019-08-24 DIAGNOSIS — Z713 Dietary counseling and surveillance: Secondary | ICD-10-CM | POA: Insufficient documentation

## 2019-08-24 DIAGNOSIS — Z6841 Body Mass Index (BMI) 40.0 and over, adult: Secondary | ICD-10-CM | POA: Insufficient documentation

## 2019-08-24 NOTE — Progress Notes (Signed)
Follow-up visit:  2 Weeks Post-Operative sleeve gastrectomy Surgery  Medical Nutrition Therapy:  Appt start time: 1320 end time:  6160.  Primary concerns today: Post-operative Bariatric Surgery Nutrition Management.   Learning Readiness:   Change in progress  Weight: 208.5lbs Height: 5'2" Weight at previous [facility] visit: 229.3lbs 07/15/19 Body Composition:  Skeletal muscle mass: 58.4lbs; Body fat mass: 103.1lbs, 49.5%    Progress:  Patient reports not feeling as well for about 1 week. She has had difficulty drinking protein shakes due to sweet taste, and is now able to drink only about 1/2 of one shake per day.   She has tried several types of yogurt, but cannot find one she likes. Cheese and boiled eggs are also difficult for her to eat.   She has felt light-headedness and some nausea, but no vomiting. She feels the symptoms could be related to hunger. Anti-nausea medication has not made any difference with her symptoms.  Dietary recall: Breakfast: Premier protein usually able to drink 1/2  Snack: sugar free popsicle  Lunch: broth/ strained soup Snack: sugar free popsicle  Dinner: broth/ strained soup Snack: tried yogurt, able to eat 2 bites  Fluid intake: about 24oz water + 16oz broth + 7oz from pops + 5-6oz protein drink Estimated total protein intake: 15g  Medications: buPROPion, HCTZ, levonorgestrel IUD, metoprolol, pantoprazole, PARoxetine, raloxifene, telmisartan   Supplementation: bariatric multivitamin 2x daily; stopped calcium prior to surgery  Using straws: no Drinking while eating: no Hair loss: no, feels limpness in hair Carbonated beverages: no N/V/D/C: some nausea, taking anti-nausea med  Dumping syndrome: no   Recent physical activity:  None since surgery, gradually increasing general activity  Progress Towards Goal(s):  In progress.  Handouts given during visit include:  Phase 3 and 4 bariatric diet handouts  Goals and Instructions   Nutritional  Diagnosis:  Dillon-3.3 Overweight/obesity As related to history of excess calories and inadequate physical activity.  As evidenced by patient with current BMI of 38.17, following bariatric diet guidelines for ongoing weight loss after sleeve gastrectomy.    Intervention:    Reviewed progress since surgery.  Instructed on advancement of diet to include solid protein foods safely.   Reviewed daily goals for protein intake; discussed options for supplemental protein sources in order to reach goal.   Discussed increasing fluid intake to meet daily goal but avoiding during and close to mealtimes.   Reviewed importance of advancing diet in stages and avoiding foods high in starch/ sugar, fat, or fiber, as well as tough, dry, sticky-textured foods.   Advised resuming calcium supplement 3x daily, separate from multivitamin.   Teaching Method Utilized:  Visual Auditory Hands on  Barriers to learning/adherence to lifestyle change: none  Demonstrated degree of understanding via:  Teach Back   Monitoring/Evaluation:  Dietary intake, exercise, and body weight.     Follow up 10/12/19 at 4:30pm for 2 month post-op visit.

## 2019-08-24 NOTE — Patient Instructions (Addendum)
   Begin adding some solid protein foods, cooked moistly. Take small bites, chew thoroughly, and eat slowly.  Try some unflavored protein powder to add to broth or soup, sugar free jello, mashed cauliflower.   Continue to avoid starchy foods and fruits. Also avoid hard, crunchy foods like nuts, seeds, raw vegetables.   Add softly cooked vegetables only when able to meet protein goal of 60grams or more.   Continue to increase daily fluid intake to reach the goal of 64oz or more.

## 2019-08-25 ENCOUNTER — Telehealth: Payer: Self-pay | Admitting: Obstetrics and Gynecology

## 2019-08-25 NOTE — Telephone Encounter (Signed)
Patient is schedule for Monday, 09/11/18 at 9:30 for Mirena replacement with ABC

## 2019-08-30 NOTE — Telephone Encounter (Signed)
1 /18/20 schedule change left for patient to call back to confirm new schedule appointment for 09/16/19 at 9:30 with ABC

## 2019-08-30 NOTE — Telephone Encounter (Signed)
Noted. Will order to arrive by apt date/time. 

## 2019-09-03 ENCOUNTER — Ambulatory Visit: Payer: BC Managed Care – PPO | Admitting: Professional

## 2019-09-12 ENCOUNTER — Ambulatory Visit: Payer: BC Managed Care – PPO | Admitting: Obstetrics and Gynecology

## 2019-09-14 NOTE — Telephone Encounter (Signed)
Patient is schedule for 09/22/19 at 3:30 for replacement

## 2019-09-15 NOTE — Telephone Encounter (Signed)
Noted. Mirena reserved for this patient. 

## 2019-09-16 ENCOUNTER — Ambulatory Visit: Payer: BC Managed Care – PPO | Admitting: Obstetrics and Gynecology

## 2019-09-17 ENCOUNTER — Ambulatory Visit: Payer: BC Managed Care – PPO | Admitting: Professional

## 2019-09-22 ENCOUNTER — Other Ambulatory Visit: Payer: Self-pay

## 2019-09-22 ENCOUNTER — Telehealth: Payer: Self-pay | Admitting: Dietician

## 2019-09-22 ENCOUNTER — Encounter: Payer: Self-pay | Admitting: Obstetrics and Gynecology

## 2019-09-22 ENCOUNTER — Ambulatory Visit (INDEPENDENT_AMBULATORY_CARE_PROVIDER_SITE_OTHER): Payer: BC Managed Care – PPO | Admitting: Obstetrics and Gynecology

## 2019-09-22 VITALS — BP 100/70 | Ht 62.0 in | Wt 196.0 lb

## 2019-09-22 DIAGNOSIS — R300 Dysuria: Secondary | ICD-10-CM | POA: Diagnosis not present

## 2019-09-22 DIAGNOSIS — Z30431 Encounter for routine checking of intrauterine contraceptive device: Secondary | ICD-10-CM

## 2019-09-22 DIAGNOSIS — R399 Unspecified symptoms and signs involving the genitourinary system: Secondary | ICD-10-CM

## 2019-09-22 DIAGNOSIS — N941 Unspecified dyspareunia: Secondary | ICD-10-CM

## 2019-09-22 LAB — POCT URINALYSIS DIPSTICK
Bilirubin, UA: NEGATIVE
Blood, UA: NEGATIVE
Glucose, UA: NEGATIVE
Ketones, UA: NEGATIVE
Nitrite, UA: NEGATIVE
Protein, UA: NEGATIVE
Spec Grav, UA: 1.02 (ref 1.010–1.025)
pH, UA: 6 (ref 5.0–8.0)

## 2019-09-22 NOTE — Progress Notes (Signed)
Ariana Shams, MD   Chief Complaint  Patient presents with  . Contraception    Mirena removal/reinsertion  . Urinary Tract Infection    frequency and burning when urinating, no blood since Dec    HPI:      Ariana Weeks is a 49 y.o. [id number] who LMP was No LMP recorded. (Menstrual status: IUD)., presents today to f/u on IUD. Mirena placed 2/16, originally due for removal this yr but now has 6 yr indication. Pt just had bariatric surg 12/20 and has lost 30 #. Is interested in OCPs vs xulane, but still on BP meds and has hx of lobular CIS RT breast (most likely can't have estrogen hormones, currently on Evista). She is still amenorrheic with IUD, occas mild dysmen. She has also had urinary frequency/urgency and dysuria since 12/20 (before bariatric surg). Sx wax and wane in intensity. No hematuria, LBP, pelvic pain, fevers. No caffeine use. No vag sx except did have ext vaginal discomfort with sex over wknd. Hx of urin incont in general requiring poise pads. Hx of kidney stones.    Patient Active Problem List   Diagnosis Date Noted  . S/P laparoscopic sleeve gastrectomy 08/09/2019  . New persistent daily headache 05/12/2019  . Hypertension 05/12/2019  . Loud snoring 03/05/2019  . Abdominal distension 08/29/2018  . 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 Obesity (BMI 30.0-34.9) 02/26/2018  . Family history of ovarian cancer 10/20/2017  . Chronic pain of right ankle 06/25/2017  . 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 Hydronephrosis of left kidney 02/02/2017  . Anxiety and depression 07/17/2015    Past Surgical History:  Procedure Laterality Date  . BREAST BIOPSY Right 02/02/2013   [place] bx/clip-neg  . BREAST BIOPSY Right 02/19/2017   affirm, ATYPICAL LOBULAR HYPERPLASIA  . BREAST BIOPSY Right 03/04/2017   Procedure: BREAST BIOPSY WITH NEEDLE LOCALIZATION;  Surgeon: Earline Mayotte, MD;  Location: [facility] ORS;  Service: General;   Laterality: Right;  . BREAST LUMPECTOMY Right 2018  . CESAREAN SECTION  2005  . CHOLECYSTECTOMY  09/2012  . CYSTOSCOPY W/ RETROGRADES N/A 02/02/2017   Procedure: CYSTOSCOPY WITH RETROGRADE PYELOGRAM;  Surgeon: Bjorn Pippin, MD;  Location: [facility] ORS;  Service: Urology;  Laterality: N/A;  . EXTRACORPOREAL SHOCK WAVE LITHOTRIPSY  2009  . INTRAUTERINE DEVICE INSERTION  MARCH 2012   MURINA  . LAPAROSCOPIC GASTRIC SLEEVE RESECTION N/A 08/09/2019   Procedure: LAPAROSCOPIC GASTRIC SLEEVE RESECTION, Upper Endo, ERAS [place];  Surgeon: Luretha Murphy, MD;  Location: [facility];  Service: General;  Laterality: N/A;  . URETERAL STENT PLACEMENT  1998  (APPROX)   Kidney Stone  . URETEROSCOPY WITH HOLMIUM LASER LITHOTRIPSY Left 02/02/2017   Procedure: URETEROSCOPY WITH HOLMIUM LASER LITHOTRIPSY;  Surgeon: Bjorn Pippin, MD;  Location: [facility] ORS;  Service: Urology;  Laterality: Left;    Family History  Problem Relation Age of Onset  . Lung cancer Father   . Other Mother        breast cyst  . Breast cancer Mother 41       tested  . Alcohol abuse Maternal Uncle   . Alcohol abuse Paternal Uncle   . Ovarian cancer Maternal Grandmother 61  . Lymphoma Maternal Grandfather        cancer  . Breast cancer Maternal Aunt [address] late 7s  . Alcohol abuse Paternal Grandfather     Social History   Socioeconomic  History  . Marital status: Married    Spouse name: Not on file  . Number of children: 1  . Years of education: Not on file  . Highest education level: Not on file  Occupational History  . Occupation: Special ED Teacher    Comment: [employer]  Tobacco Use  . Smoking status: Former Smoker    Packs/day: 0.50    Years: 16.00    Pack years: 8.00    Types: Cigarettes    Quit date: 06/25/2014    Years since quitting: 5.2  . Smokeless tobacco: Never Used  Substance and Sexual Activity  . Alcohol use: Yes    Alcohol/week: 0.0 standard drinks    Comment: RARE  . Drug use: No   . Sexual activity: Yes    Birth control/protection: I.U.D.    Comment: Mirena  Other Topics Concern  . Not on file  Social History Narrative   No regular exercise.      Nutrition: 3 meals a day, limited fruits and vegetables, fast meals occasionally      Cell: [phone]      Married   Technical brewer education   2 children    Caffeine- 1-2 cups of coke daily   Social Determinants of Health   Financial Resource Strain:   . Difficulty of Paying Living Expenses: Not on file  Food Insecurity:   . Worried About Programme researcher, broadcasting/film/video in the Last Year: Not on file  . Ran Out of Food in the Last Year: Not on file  Transportation Needs:   . Lack of Transportation (Medical): Not on file  . Lack of Transportation (Non-Medical): Not on file  Physical Activity:   . Days of Exercise per Week: Not on file  . Minutes of Exercise per Session: Not on file  Stress:   . Feeling of Stress : Not on file  Social Connections:   . Frequency of Communication with Friends and Family: Not on file  . Frequency of Social Gatherings with Friends and Family: Not on file  . Attends Religious Services: Not on file  . Active Member of Clubs or Organizations: Not on file  . Attends Banker Meetings: Not on file  . Marital Status: Not on file  Intimate Partner Violence:   . Fear of Current or Ex-Partner: Not on file  . Emotionally Abused: Not on file  . Physically Abused: Not on file  . Sexually Abused: Not on file    Outpatient Medications Prior to Visit  Medication Sig Dispense Refill  . buPROPion (WELLBUTRIN XL) 300 MG 24 hr tablet Take 1 tablet (300 mg total) by mouth daily. 90 tablet 1  . hydrochlorothiazide (HYDRODIURIL) 25 MG tablet Take 1 tablet (25 mg total) by mouth daily. 90 tablet 3  . levonorgestrel (MIRENA) 20 MCG/24HR IUD 1 each by Intrauterine route once.    . metoprolol succinate (TOPROL-XL) 100 MG 24 hr tablet Take 1 tablet (100 mg total) by mouth daily. 90  tablet 1  . Multiple Vitamins-Minerals (BARIATRIC FUSION) CHEW Chew 1 each by mouth 2 (two) times daily.    . pantoprazole (PROTONIX) 40 MG tablet Take 1 tablet (40 mg total) by mouth daily. 90 tablet 0  . PARoxetine (PAXIL) 10 MG tablet TAKE 1 TABLET(10 MG) BY MOUTH DAILY 90 tablet 1  . raloxifene (EVISTA) 60 MG tablet TAKE 1 TABLET(60 MG) BY MOUTH DAILY (Patient taking differently: Take 60 mg by mouth daily. ) 30 tablet 12  .  telmisartan (MICARDIS) 40 MG tablet Take 1 tablet (40 mg total) by mouth at bedtime. (Patient not taking: Reported on 09/22/2019) 90 tablet 1  . traMADol (ULTRAM) 50 MG tablet Take 1 tablet (50 mg total) by mouth every 6 (six) hours as needed (pain). (Patient not taking: Reported on 08/24/2019) 10 tablet 0  . ondansetron (ZOFRAN-ODT) 4 MG disintegrating tablet Take 1 tablet (4 mg total) by mouth every 6 (six) hours as needed for nausea or vomiting. 20 tablet 0  . traMADol (ULTRAM) 50 MG tablet Take 1 tablet (50 mg total) by mouth 3 (three) times daily as needed. (Patient not taking: Reported on 08/24/2019) 90 tablet 1   No facility-administered medications prior to visit.      ROS:  Review of Systems  Constitutional: Negative for fever.  Gastrointestinal: Negative for blood in stool, constipation, diarrhea, nausea and vomiting.  Genitourinary: Positive for dyspareunia, dysuria, frequency and urgency. Negative for flank pain, hematuria, vaginal bleeding, vaginal discharge and vaginal pain.  Musculoskeletal: Negative for back pain.  Skin: Negative for rash.  BREAST: No symptoms   OBJECTIVE:   Vitals:  BP 100/70   Ht 5\' 2"  (1.575 m)   Wt 196 lb (88.9 kg)   BMI 35.85 kg/m   Physical Exam Vitals reviewed.  Constitutional:      Appearance: She is well-developed.  Pulmonary:     Effort: Pulmonary effort is normal.  Genitourinary:    General: Normal vulva.     Pubic Area: No rash.      Labia:        Right: No rash, tenderness or lesion.        Left: No  rash, tenderness or lesion.      Vagina: Normal. No vaginal discharge, erythema or tenderness.     Cervix: Normal.     Uterus: Normal. Not enlarged and not tender.      Adnexa: Right adnexa normal and left adnexa normal.       Right: No mass or tenderness.         Left: No mass or tenderness.       Comments: IUD STRINGS IN CX OS Musculoskeletal:        General: Normal range of motion.     Cervical back: Normal range of motion.  Skin:    General: Skin is warm and dry.  Neurological:     General: No focal deficit present.     Mental Status: She is alert and oriented to person, place, and time.  Psychiatric:        Mood and Affect: Mood normal.        Behavior: Behavior normal.        Thought Content: Thought content normal.        Judgment: Judgment normal.     Results: Results for orders placed or performed in visit on 09/22/19 (from the past 24 hour(s))  POCT Urinalysis Dipstick     Status: Abnormal   Collection Time: 09/22/19  4:05 PM  Result Value Ref Range   Color, UA yellow    Clarity, UA clear    Glucose, UA Negative Negative   Bilirubin, UA neg    Ketones, UA neg    Spec Grav, UA 1.020 1.010 - 1.025   Blood, UA neg    pH, UA 6.0 5.0 - 8.0   Protein, UA Negative Negative   Urobilinogen, UA     Nitrite, UA neg    Leukocytes, UA Trace (A) Negative   Appearance  Odor       Assessment/Plan: UTI symptoms - Plan: Urine Culture, POCT Urinalysis Dipstick; Neg UA, pos sx. Check C&S. Will f/u with results. Try AZO in meantime for bladder spasm to see if related. Pt denies caffeine use.  Encounter for routine checking of intrauterine contraceptive device (IUD)--IUD in place. Discussed changing to OCPs/xulane but since pt still on BP meds, can't do that for now. Suggested keep IUD until 2/21 expiration since doing well which gives time for more wt loss and BP f/u. Will also need to eval estrogen use with hx of RT lobular CIS. Pt amenable to plan. F/u at 6/21 annual to  discuss further.    Return if symptoms worsen or fail to improve.  Alicia B. Copland, PA-C 09/22/2019 4:08 PM

## 2019-09-22 NOTE — Telephone Encounter (Signed)
Returned message from patient regarding nutrition related questions. Unable to leave message as her voicemail is full.

## 2019-09-22 NOTE — Patient Instructions (Signed)
I value your feedback and entrusting us with your care. If you get a [facility] patient survey, I would appreciate you taking the time to let us know about your experience today. Thank you!  As of August 04, 2019, your lab results will be released to your MyChart immediately, before I even have a chance to see them. Please give me time to review them and contact you if there are any abnormalities. Thank you for your patience.  

## 2019-09-23 ENCOUNTER — Encounter: Payer: Self-pay | Admitting: Dietician

## 2019-09-23 NOTE — Progress Notes (Signed)
Sent the following email to Ariana Weeks today:  Ariana Weeks, I received a voicemail message from you about having questions since your surgery; I tried calling back but your voicemail box is full.  So hopefully this email will work! I understand you had emailed me earlier.I didn't receive one, even in junk mail. I check that routinely. So maybe if you receive this message you can respond and we can finally get your questions answered! I hope you are doing well.  Thanks,  Vanetta Mulders, Med, RD, LDN, CDE Dietitian Nutrition and Diabetes Education Services, [facility] [phone]

## 2019-09-24 ENCOUNTER — Telehealth: Payer: Self-pay | Admitting: Obstetrics and Gynecology

## 2019-09-24 LAB — URINE CULTURE

## 2019-09-24 MED ORDER — CEPHALEXIN 500 MG PO CAPS: 500.0000 mg | ORAL_CAPSULE | Freq: Two times a day (BID) | ORAL | 0 refills | Status: AC

## 2019-09-24 NOTE — Telephone Encounter (Signed)
Pt aware of positive C&S results. Rx keflex eRxd. F/u prn.

## 2019-09-26 ENCOUNTER — Encounter: Payer: Self-pay | Admitting: Dietician

## 2019-09-26 NOTE — Progress Notes (Signed)
09/27/19 Email messages with patient:  Aram Beecham I'm so sorry my mailbox was full.  I'm not sure where I sent email it was on one of my papers.  So I feel like I'm doing good with my protein and veggies but I'm very picky so not doing so good with snacks so I feel hungry a lot.  My protein shakes I love now make me feel sick to my stomach so I'm wondering what else I can add to my diet :  protein bars, salads fruit???? My appointment with you isMy until feb 17th.  Thanks just let me know   Dicie Beam,  I'm glad that we are able to touch base!  You should be ok to try some salad, but avoid iceberg lettuce, as it is harder to digest. Also avoid hard dense vegetables like raw carrots, radishes, celery. Peeled seedless cucumber, tomato, bell peppers should be fine with a lettuce other than iceberg. Avoid adding nuts, seeds, bacon, croutons. Use small amounts of low fat dressing.  If you try a protein bar it would need to be low in carbs and fat, at least under 10grams for both.   I recently had someone try some Quest chips (low carb, high protein) that they got at either [facility] or [employer], and they really liked them. Said there were several flavors. Just be careful with textures - nothing too hard.  You can probably tolerate some fruit, although it's still good to limit or avoid the sugar, so if you can wait until we meet back, or a little longer, that might be good. If you do try some fruit, avoid tough skins/peels/ seeds, and make sure to chew thoroughly as with everything. And no "added" sugars (like with any canned fruits). You can add protein powder to applesauce or other pureed fruit.  Hope that helps, at least get you through the next couple of weeks. Pam

## 2019-09-29 ENCOUNTER — Other Ambulatory Visit: Payer: Self-pay

## 2019-09-29 MED ORDER — BUPROPION HCL ER (XL) 300 MG PO TB24: 300.0000 mg | ORAL_TABLET | Freq: Every day | ORAL | 1 refills | Status: DC

## 2019-09-30 ENCOUNTER — Telehealth: Payer: Self-pay

## 2019-09-30 MED ORDER — SULFAMETHOXAZOLE-TRIMETHOPRIM 800-160 MG PO TABS: 1.0000 | ORAL_TABLET | Freq: Two times a day (BID) | ORAL | 0 refills | Status: AC

## 2019-09-30 NOTE — Telephone Encounter (Signed)
Rx bactrim eRxd. F/u if sx persist. Thx

## 2019-09-30 NOTE — Telephone Encounter (Signed)
Called pt, no answer, LVMTRC. 

## 2019-09-30 NOTE — Telephone Encounter (Signed)
Pt is on last two days of antibiotic for UTI, She states she is still very uncomfortable, can you send in a different RX.

## 2019-09-30 NOTE — Telephone Encounter (Signed)
Rx bactrim eRxd. Pt allergic to macrobid. Did keflex without sx relief.

## 2019-10-01 ENCOUNTER — Ambulatory Visit: Payer: BC Managed Care – PPO | Admitting: Professional

## 2019-10-05 ENCOUNTER — Other Ambulatory Visit: Payer: Self-pay | Admitting: Obstetrics and Gynecology

## 2019-10-05 ENCOUNTER — Telehealth: Payer: Self-pay

## 2019-10-05 MED ORDER — FLUCONAZOLE 150 MG PO TABS: 150.0000 mg | ORAL_TABLET | Freq: Once | ORAL | 0 refills | Status: AC

## 2019-10-05 NOTE — Telephone Encounter (Signed)
Called pt again to rec monistat.  Pt states she has been notified that rx has been sent in.

## 2019-10-05 NOTE — Telephone Encounter (Signed)
Pt on her 2nd antibiotic she feels better but now feels like she has a yeast infection, can you send in a diflucan or does she need to come in ?

## 2019-10-05 NOTE — Telephone Encounter (Signed)
Diflucan eRxd. Pls notify pt. Thx

## 2019-10-05 NOTE — Telephone Encounter (Signed)
Pt aware.

## 2019-10-05 NOTE — Telephone Encounter (Signed)
Pt calling; had medication rx'd last Fri; now is itching really bad.  What to do?  [phone] [facility]

## 2019-10-05 NOTE — Progress Notes (Signed)
Rx diflucan for yeast vag sx after abx for UTI

## 2019-10-12 ENCOUNTER — Telehealth: Payer: Self-pay

## 2019-10-12 ENCOUNTER — Encounter: Payer: BC Managed Care – PPO | Attending: Surgery | Admitting: Dietician

## 2019-10-12 ENCOUNTER — Ambulatory Visit (INDEPENDENT_AMBULATORY_CARE_PROVIDER_SITE_OTHER): Payer: BC Managed Care – PPO

## 2019-10-12 ENCOUNTER — Other Ambulatory Visit: Payer: Self-pay

## 2019-10-12 ENCOUNTER — Encounter: Payer: Self-pay | Admitting: Dietician

## 2019-10-12 VITALS — Ht 62.0 in | Wt 188.4 lb

## 2019-10-12 DIAGNOSIS — Z6841 Body Mass Index (BMI) 40.0 and over, adult: Secondary | ICD-10-CM | POA: Insufficient documentation

## 2019-10-12 DIAGNOSIS — N898 Other specified noninflammatory disorders of vagina: Secondary | ICD-10-CM

## 2019-10-12 DIAGNOSIS — E6609 Other obesity due to excess calories: Secondary | ICD-10-CM | POA: Diagnosis not present

## 2019-10-12 DIAGNOSIS — Z6834 Body mass index (BMI) 34.0-34.9, adult: Secondary | ICD-10-CM

## 2019-10-12 DIAGNOSIS — R300 Dysuria: Secondary | ICD-10-CM | POA: Diagnosis not present

## 2019-10-12 DIAGNOSIS — Z713 Dietary counseling and surveillance: Secondary | ICD-10-CM | POA: Insufficient documentation

## 2019-10-12 LAB — POCT URINALYSIS DIPSTICK
Bilirubin, UA: NEGATIVE
Blood, UA: NEGATIVE
Glucose, UA: NEGATIVE
Ketones, UA: NEGATIVE
Leukocytes, UA: NEGATIVE
Nitrite, UA: NEGATIVE
Protein, UA: NEGATIVE
Spec Grav, UA: 1.02 (ref 1.010–1.025)
Urobilinogen, UA: NEGATIVE E.U./dL — AB
pH, UA: 7 (ref 5.0–8.0)

## 2019-10-12 MED ORDER — FLUCONAZOLE 150 MG PO TABS: 150.0000 mg | ORAL_TABLET | Freq: Once | ORAL | 0 refills | Status: AC

## 2019-10-12 NOTE — Telephone Encounter (Signed)
Pt aware, transferred to [facility] to schedule UA with nurse. Denies any yeast vaginal sx.

## 2019-10-12 NOTE — Patient Instructions (Addendum)
   Try adding 1/3 to 1/2 scoop protein powder to oatmeal, or at least some Fairlife milk to boost protein.   Gradually add more veggies including sweet potato, peas, some fruits (no added sugar) over the next 4 months.   Look for low-carb tortillas, crackers to satisfy craving without increasing carbs or eating starchy foods that might not digest as well. A low-carb cauliflower pizza crust might also substitute for bread.   Make sure you are reaching your protein goal of 60 grams or more daily. No need to go above 80 grams daily.

## 2019-10-12 NOTE — Telephone Encounter (Signed)
Pt here for UA drop off. UA was normal, sending for CX per [facility]. Pt does c/o some vaginal itching that started again today (has been on 2 antibiotics). I advised her we would send this msg to [facility] and we will send culture and [facility] will let her know the results. I told her [facility] would probably send in another diflucan. Please advise/send this in if you think it would help with her symptoms.

## 2019-10-12 NOTE — Telephone Encounter (Signed)
Pt needs to RTO for repeat UA with nurse. Also make sure no vaginal sx from abx causing yeast vag sx.

## 2019-10-12 NOTE — Telephone Encounter (Signed)
LM for pt re: neg UA. Check C&S. If neg, may be due to bladder spasms and can take AZO. If pos, will send in new abx Rx. Also sending in 2nd diflucan in case vaginal yeast cause of urin sx. F/u prn.

## 2019-10-12 NOTE — Telephone Encounter (Signed)
Pt calling; has had UTI for awhile, has had 2 antibxs; is still uncomfortable; she got AZO last night and it relieves some of the uncomfortableness.  What to do?  [phone]

## 2019-10-12 NOTE — Progress Notes (Signed)
Follow-up visit:  2 Months Post-Operative Sleeve Gastrectomy Surgery  Medical Nutrition Therapy:  Appt start time: 1630 end time:  1730.  Primary concerns today: Post-operative Bariatric Surgery Nutrition Management.   Learning Readiness:   Change in progress  Weight: 188.4lbs with shoes and sweater Height: 5'2" Weight at previous NDES visit: 208.5lbs Patient declined body composition measurement today    Progress:  Weight loss of 20.1lbs since previous visit on 08/24/19.  Patient reports walking with coworkers at lunch or after work. Getting support from one colleague who had bariattric surgery sev years ago.  She has been unable to tolerate protein shakes due to excessive sweet taste, feels nauseated. She also reports picky eating, many food dislikes including fish, cottage cheese, yogurt, and some vegetables. She has been craving crackers and cereal.    Dietary recall: Breakfast: low sugar oatmeal with apple and cinnamon (at school); weekends eggs with [place] sausage Snack: protein bar (fiber one mini 90kcal or 1/2 quest bar); deli meat and cheese roll-up Lunch: leftover chicken or grilled shrimp; healthy choice or lean cuisine meal low carb (uses pre-op options) + about 3Tbsp broccoli, green beans, cauliflower, carrots, peas steamed or roasted Snack: none  Dinner: chicken, shrimp, lean steak on indoor griddle cut into small pieces + low carb veg Snack: occasionally 1/2 peanut butter Quest cookie  Fluid intake: water -- keeps 24oz container -- estimates up to 8 per day Estimated total protein intake: 50-65g daily  Medications: buPROPion, HCTZ, levonorgestrel IUD, metoprolol, pantoprazole, PARoxetine, raloxifene, telmisartan Supplementation: bariatric multivitamin 2x daily + calcium 1 or more times daily (often forgets)  Using straws: no Drinking while eating: no Hair loss: no, although hair feels thinner Carbonated beverages: no N/V/D/C: constipation -- using miralax  which has helped Dumping syndrome: no   Recent physical activity:  Walking outside or in gym 5x a week  Progress Towards Goal(s):  In progress.  Handouts given during visit include:  Phase 4 and 5 bariatric diet handouts   Nutritional Diagnosis:  [place]-3.3 Overweight/obesity As related to history of excess calories and inactivity.  As evidenced by patient with current BMI of 34.5, following bariatric diet guidelines for ongoing weight loss after sleeve gastrectomy.    Intervention:    Commended patient for progress with weight loss and increase in exercise.  She seems to be receiving positive support from colleagues and family.  Due to food dislikes, she is searching for more variety within diet guidelines. Discussed importance of meeting protein goal daily and options for easy protein sources. Advised ongoing limitation of starchy foods and foods with hard, dense textures. Also emphasized importance of low sugar and low fat foods.   Instructed on advancement of diet to include more low-carb vegetables, some soft fruits in small portions only with protein source.   Scheduled appointment in 2 months per patient preference.  Teaching Method Utilized:  Visual Auditory Hands on  Barriers to learning/adherence to lifestyle change: none  Demonstrated degree of understanding via:  Teach Back   Monitoring/Evaluation:  Dietary intake, exercise, and body weight. Follow up 12/07/19

## 2019-10-13 ENCOUNTER — Other Ambulatory Visit: Payer: Self-pay | Admitting: Obstetrics and Gynecology

## 2019-10-15 ENCOUNTER — Ambulatory Visit: Payer: BC Managed Care – PPO | Admitting: Professional

## 2019-10-15 LAB — URINE CULTURE

## 2019-10-27 ENCOUNTER — Other Ambulatory Visit: Payer: BC Managed Care – PPO

## 2019-10-29 ENCOUNTER — Ambulatory Visit: Payer: BC Managed Care – PPO | Admitting: Professional

## 2019-10-29 ENCOUNTER — Other Ambulatory Visit: Payer: Self-pay

## 2019-10-29 ENCOUNTER — Ambulatory Visit: Payer: BC Managed Care – PPO | Attending: Internal Medicine

## 2019-10-29 DIAGNOSIS — Z23 Encounter for immunization: Secondary | ICD-10-CM | POA: Insufficient documentation

## 2019-10-29 NOTE — Progress Notes (Signed)
   Covid-19 Vaccination Clinic  Name:  Ariana Weeks    MRN: [id number] DOB: 1970-10-13  10/29/2019  [name] was observed post Covid-19 immunization for 15 minutes without incident. She was provided with Vaccine Information Sheet and instruction to access the V-Safe system.   [name] was instructed to call 911 with any severe reactions post vaccine: Marland Kitchen Difficulty breathing  . Swelling of face and throat  . A fast heartbeat  . A bad rash all over body  . Dizziness and weakness   Immunizations Administered    Name Date Dose VIS Date Route   Moderna COVID-19 Vaccine 10/29/2019  1:50 PM 0.5 mL 07/26/2019 Intramuscular   Manufacturer: Moderna   Lot: [id number]   NDC: 06015-615-37

## 2019-11-12 ENCOUNTER — Ambulatory Visit: Payer: BC Managed Care – PPO | Admitting: Professional

## 2019-11-26 ENCOUNTER — Ambulatory Visit: Payer: BC Managed Care – PPO | Attending: Internal Medicine

## 2019-11-26 ENCOUNTER — Ambulatory Visit: Payer: BC Managed Care – PPO | Admitting: Professional

## 2019-11-26 DIAGNOSIS — Z23 Encounter for immunization: Secondary | ICD-10-CM

## 2019-11-26 NOTE — Progress Notes (Signed)
   Covid-19 Vaccination Clinic  Name:  Ariana Weeks    MRN: [id number] DOB: April 09, 1971  11/26/2019  Ariana Weeks was observed post Covid-19 immunization for 15 minutes without incident. She was provided with Vaccine Information Sheet and instruction to access the V-Safe system.   Ariana Weeks was instructed to call 911 with any severe reactions post vaccine: Marland Kitchen Difficulty breathing  . Swelling of face and throat  . A fast heartbeat  . A bad rash all over body  . Dizziness and weakness   Immunizations Administered    Name Date Dose VIS Date Route   Moderna COVID-19 Vaccine 11/26/2019 10:30 AM 0.5 mL 07/26/2019 Intramuscular   Manufacturer: Gala Murdoch   Lot: [id number]   NDC: 11552-080-22

## 2019-12-07 ENCOUNTER — Encounter: Payer: BC Managed Care – PPO | Attending: Surgery | Admitting: Dietician

## 2019-12-07 ENCOUNTER — Encounter: Payer: Self-pay | Admitting: Dietician

## 2019-12-07 ENCOUNTER — Other Ambulatory Visit: Payer: Self-pay

## 2019-12-07 VITALS — Ht 62.0 in | Wt 179.2 lb

## 2019-12-07 DIAGNOSIS — Z6841 Body Mass Index (BMI) 40.0 and over, adult: Secondary | ICD-10-CM | POA: Insufficient documentation

## 2019-12-07 DIAGNOSIS — Z713 Dietary counseling and surveillance: Secondary | ICD-10-CM | POA: Insufficient documentation

## 2019-12-07 DIAGNOSIS — Z6832 Body mass index (BMI) 32.0-32.9, adult: Secondary | ICD-10-CM

## 2019-12-07 DIAGNOSIS — E6609 Other obesity due to excess calories: Secondary | ICD-10-CM | POA: Diagnosis not present

## 2019-12-07 NOTE — Progress Notes (Signed)
Follow-up visit:  4 Months Post-Operative Sleeve Gastrectomy Surgery  Medical Nutrition Therapy:  Appt start time: 1630 end time:  1715  Primary concerns today: Post-operative Bariatric Surgery Nutrition Management.   Learning Readiness:   Change in progress  Weight: 179.2lbs Height: 5'2" Weight at previous NDES visit: 188.4lbs Body Composition:  Skeletal muscle mass: 55.6lbs; body fat: 43.7%    Progress:  Weight loss of 9.2lbs since 10/12/19.  Patient reports some constipation; she is using miralax every few days, as she then has diarrhea if she takes more often.  Reports missing crunchy foods ie crackers, nuts, etc., and asks for foods she can begin reentering into her diet.   She is able to eat 1-2oz portions of solid protein foods at a sitting.   Dietary recall: Breakfast: fairlife protein shake; weekends low sugar oatmeal Snack: protein bar (think, Atkins, or 90-cal protein 1) -- avg 10g protein Lunch: today-- low carb tortilla with 1oz [place]; frozen meal 7-15g protein; leftover meat from previous dinner; salad with lettuce, cheese, bacon bits, occ with a few tortilla strips (to satisfy craving for crunchy food) Snack: baby carrots, cherry tomatoes  Dinner: chicken/ steak/ shrimp/ extra lean ground beef + -- taco without shell + cheese, black beans, tomato; green beans; green peas; canned carrots;  Snack: occasionally remainder of supper meal; occasionally 1/2 Quest cookie  Fluid intake: water-- several 24oz containers daily (with sugar free flavoring) + protein shake Estimated total protein intake: 1-3oz portions meat 2x daily  Medications: buPROPion, HCTZ, levonorgestrel IUD, metoprolol, pantoprazole, PARoxetine, raloxifene, telmisartan Supplementation: bariatric multivitamin 2x daily + calcium 1-3x daily  Using straws: no Drinking while eating: no Hair loss: no Carbonated beverages: no N/V/D/C: constipation -- uses miralax as needed    Recent physical activity:   Walking 30-35 minutes, 4-5 times a week  Progress Towards Goal(s):  In progress.  Handouts given during visit include:  Bariatric diet stage template  Goals and Instructions (AVS)   Nutritional Diagnosis:  [place]-3.3 Overweight/obesity As related to history of excess calories and inactivity.  As evidenced by patient with current BMI of 32.78, following bariatric diet guidelines for ongoing weight loss after sleeve gastrectomy.    Intervention:    Reviewed progress since previous visit.  Reviewed diet stages and reasons for limiting carbohydrate foods including tolerance and GI healing, potential for progressing to less healthy foods, ability to meet protein goals.   Discussed whole grain unrefined choices as healthy options; advised consuming protein first.   Discussed effects of fiber intake on constipation.  Discussed reducing fluid intake to allow for increasing protein portions with meals.   Teaching Method Utilized:  Visual Auditory Hands on  Barriers to learning/adherence to lifestyle change: none  Demonstrated degree of understanding via:  Teach Back   Monitoring/Evaluation:  Dietary intake, exercise, and body weight. Follow up in 2 months for 6 month post-op visit.

## 2019-12-07 NOTE — Patient Instructions (Addendum)
   Continue to limit amount of grain/ starchy foods to ensure you can meet daily goal for protein. Make sure to include a protein with every grain food. Continue to avoid soft starchy foods like rice, pasta, soft bread.   Avoid very hard crunchy foods like nuts and seed until 6 months post op to ensure adequate healing and ability to tolerate.  Try Skinny Girl brand ranch dressing as a dip for veggies.   Keep up your regular exercise, great job!

## 2019-12-14 ENCOUNTER — Encounter: Payer: Self-pay | Admitting: Obstetrics and Gynecology

## 2019-12-14 ENCOUNTER — Ambulatory Visit (INDEPENDENT_AMBULATORY_CARE_PROVIDER_SITE_OTHER): Payer: BC Managed Care – PPO | Admitting: Obstetrics and Gynecology

## 2019-12-14 ENCOUNTER — Other Ambulatory Visit: Payer: Self-pay

## 2019-12-14 VITALS — BP 120/80 | Ht 62.0 in | Wt 179.0 lb

## 2019-12-14 DIAGNOSIS — R3915 Urgency of urination: Secondary | ICD-10-CM | POA: Diagnosis not present

## 2019-12-14 LAB — POCT URINALYSIS DIPSTICK
Bilirubin, UA: NEGATIVE
Blood, UA: NEGATIVE
Glucose, UA: NEGATIVE
Ketones, UA: NEGATIVE
Leukocytes, UA: NEGATIVE
Nitrite, UA: NEGATIVE
Protein, UA: NEGATIVE
Spec Grav, UA: 1.02 (ref 1.010–1.025)
pH, UA: 5 (ref 5.0–8.0)

## 2019-12-14 NOTE — Progress Notes (Signed)
[name], MD   Chief Complaint  Patient presents with  . Follow-up    frequency, not sure if hurting, blood when wipes    HPI:      [name] is a 49 y.o. [id number] who LMP was No LMP recorded. (Menstrual status: IUD)., presents today for persistent urinary frequency and urgency, sometimes with a good flow. No dysuria, hematuria, LBP, fevers. Not holding urine all day at school. Has vaginal and pelvic discomfort and feels like she needs to "hold herself" vaginally or cross her legs when sx occur. Urinary sx started end of 2020. Treated for E. coli on C&S 1/21 with keflex and bactrim without sx relief. Then had neg repeat UA/Neg C&S 2/21. Pt's sx have persisted. Denies any caffeine intake. Tried uribel and AZO without sx change. No vag sx. She is sex active with husband, sometimes has to stop due to vaginal discomfort that correlates with urin sx. Saw urology, including Devin Going, in past. Hx of kidney stones and lithotripsy but no stone pain currently. S/p bariatric surg/gastric sleeve 12/20 but sx started prior to this.  Past Medical History:  Diagnosis Date  . BRCA negative 02/2019   MyRisk neg except AXIN2 VUS  . Depression   . Head cold   . History of frequent urinary tract infections   . History of kidney stones   . Hypertension   . Increased risk of breast cancer 02/2019   IBIS=66% due to hx of lobular CIS  . Kidney stones   . Left ureteral calculus   . 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 [name]  . Sleep apnea    no- c-pap    Past Surgical History:  Procedure Laterality Date  . BREAST BIOPSY Right 02/02/2013   [place] bx/clip-neg  . BREAST BIOPSY Right 02/19/2017   affirm, ATYPICAL LOBULAR HYPERPLASIA  . BREAST BIOPSY Right 03/04/2017   Procedure: BREAST BIOPSY WITH NEEDLE LOCALIZATION;  Surgeon: [name], MD;  Location: [facility] ORS;  Service: General;  Laterality: Right;  . BREAST LUMPECTOMY Right 2018  . CESAREAN SECTION   2005  . CHOLECYSTECTOMY  09/2012  . CYSTOSCOPY W/ RETROGRADES N/A 02/02/2017   Procedure: CYSTOSCOPY WITH RETROGRADE PYELOGRAM;  Surgeon: [name], MD;  Location: [facility] ORS;  Service: Urology;  Laterality: N/A;  . EXTRACORPOREAL SHOCK WAVE LITHOTRIPSY  2009  . INTRAUTERINE DEVICE INSERTION  MARCH 2012   MURINA  . LAPAROSCOPIC GASTRIC SLEEVE RESECTION N/A 08/09/2019   Procedure: LAPAROSCOPIC GASTRIC SLEEVE RESECTION, Upper Endo, ERAS [place];  Surgeon: [name], MD;  Location: [facility] ORS;  Service: General;  Laterality: N/A;  . [place]  (APPROX)   Kidney Stone  . URETEROSCOPY WITH HOLMIUM LASER LITHOTRIPSY Left 02/02/2017   Procedure: URETEROSCOPY WITH HOLMIUM LASER LITHOTRIPSY;  Surgeon: [name], MD;  Location: [facility] ORS;  Service: Urology;  Laterality: Left;    Family History  Problem Relation Age of Onset  . Lung cancer Father   . Other Mother        breast cyst  . Breast cancer Mother 29       tested  . Alcohol abuse Maternal Uncle   . Alcohol abuse Paternal Uncle   . Ovarian cancer Maternal Grandmother 76  . Lymphoma Maternal Grandfather        cancer  . Breast cancer Maternal Aunt [address], late 65s  . Alcohol  abuse Paternal Grandfather     Social History   Socioeconomic History  . Marital status: Married    Spouse name: Not on file  . Number of children: 1  . Years of education: Not on file  . Highest education level: Not on file  Occupational History  . Occupation: Special ED Teacher    Comment: [employer]  Tobacco Use  . Smoking status: Former Smoker    Packs/day: 0.50    Years: 16.00    Pack years: 8.00    Types: Cigarettes    Quit date: 06/25/2014    Years since quitting: 5.4  . Smokeless tobacco: Never Used  Substance and Sexual Activity  . Alcohol use: Yes    Alcohol/week: 0.0 standard drinks    Comment: RARE  . Drug use: No  . Sexual activity: Yes    Birth control/protection: I.U.D.     Comment: Mirena  Other Topics Concern  . Not on file  Social History Narrative   No regular exercise.      Nutrition: 3 meals a day, limited fruits and vegetables, fast meals occasionally      Cell: [phone]      Married   Investment banker, corporate education   2 children    Caffeine- 1-2 cups of coke daily   Social Determinants of Health   Financial Resource Strain:   . Difficulty of Paying Living Expenses:   Food Insecurity:   . Worried About Charity fundraiser in the Last Year:   . Arboriculturist in the Last Year:   Transportation Needs:   . Film/video editor (Medical):   Marland Kitchen Lack of Transportation (Non-Medical):   Physical Activity:   . Days of Exercise per Week:   . Minutes of Exercise per Session:   Stress:   . Feeling of Stress :   Social Connections:   . Frequency of Communication with Friends and Family:   . Frequency of Social Gatherings with Friends and Family:   . Attends Religious Services:   . Active Member of Clubs or Organizations:   . Attends Archivist Meetings:   Marland Kitchen Marital Status:   Intimate Partner Violence:   . Fear of Current or Ex-Partner:   . Emotionally Abused:   Marland Kitchen Physically Abused:   . Sexually Abused:     Outpatient Medications Prior to Visit  Medication Sig Dispense Refill  . buPROPion (WELLBUTRIN XL) 300 MG 24 hr tablet Take 1 tablet (300 mg total) by mouth daily. 90 tablet 1  . hydrochlorothiazide (HYDRODIURIL) 25 MG tablet Take 1 tablet (25 mg total) by mouth daily. 90 tablet 3  . Multiple Vitamins-Minerals (BARIATRIC FUSION) CHEW Chew 1 each by mouth 2 (two) times daily.    Marland Kitchen PARoxetine (PAXIL) 10 MG tablet TAKE 1 TABLET(10 MG) BY MOUTH DAILY 90 tablet 1  . raloxifene (EVISTA) 60 MG tablet TAKE 1 TABLET(60 MG) BY MOUTH DAILY (Patient taking differently: Take 60 mg by mouth daily. ) 30 tablet 12  . traMADol (ULTRAM) 50 MG tablet Take 1 tablet (50 mg total) by mouth every 6 (six) hours as needed (pain). 10 tablet 0  .  levonorgestrel (MIRENA) 20 MCG/24HR IUD 1 each by Intrauterine route once.    . metoprolol succinate (TOPROL-XL) 100 MG 24 hr tablet Take 1 tablet (100 mg total) by mouth daily. (Patient not taking: Reported on 12/14/2019) 90 tablet 1  . pantoprazole (PROTONIX) 40 MG tablet Take 1 tablet (40 mg  total) by mouth daily. (Patient not taking: Reported on 12/14/2019) 90 tablet 0  . telmisartan (MICARDIS) 40 MG tablet Take 1 tablet (40 mg total) by mouth at bedtime. (Patient not taking: Reported on 09/22/2019) 90 tablet 1   No facility-administered medications prior to visit.      ROS:  Review of Systems  Constitutional: Negative for fever.  Gastrointestinal: Negative for blood in stool, constipation, diarrhea, nausea and vomiting.  Genitourinary: Positive for frequency, pelvic pain and urgency. Negative for dyspareunia, dysuria, flank pain, hematuria, vaginal bleeding, vaginal discharge and vaginal pain.  Musculoskeletal: Negative for back pain.  Skin: Negative for rash.  BREAST: No symptoms   OBJECTIVE:   Vitals:  BP 120/80   Ht _0  (1.575 m)   Wt 179 lb (81.2 kg)   BMI 32.74 kg/m   Physical Exam Vitals reviewed.  Constitutional:      Appearance: She is well-developed.  Pulmonary:     Effort: Pulmonary effort is normal.  Musculoskeletal:        General: Normal range of motion.     Cervical back: Normal range of motion.  Skin:    General: Skin is warm and dry.  Neurological:     General: No focal deficit present.     Mental Status: She is alert and oriented to person, place, and time.     Cranial Nerves: No cranial nerve deficit.  Psychiatric:        Mood and Affect: Mood normal.        Behavior: Behavior normal.        Thought Content: Thought content normal.        Judgment: Judgment normal.     Results: Results for orders placed or performed in visit on 12/14/19 (from the past 24 hour(s))  POCT Urinalysis Dipstick     Status: Normal   Collection Time: 12/14/19   4:52 PM  Result Value Ref Range   Color, UA yellow    Clarity, UA clear    Glucose, UA Negative Negative   Bilirubin, UA neg    Ketones, UA neg    Spec Grav, UA 1.020 1.010 - 1.025   Blood, UA neg    pH, UA 5.0 5.0 - 8.0   Protein, UA Negative Negative   Urobilinogen, UA     Nitrite, UA neg    Leukocytes, UA Negative Negative   Appearance     Odor       Assessment/Plan: Urinary urgency - Plan: POCT Urinalysis Dipstick, Urine Culture, Ambulatory referral to Urology; Persistent sx. Neg UA. Check C&S. Will f/u if pos. Refer to urology. F/u prn.      Return if symptoms worsen or fail to improve.  Ojani Berenson B. Linkyn Gobin, PA-C 12/14/2019 4:54 PM

## 2019-12-14 NOTE — Patient Instructions (Signed)
I value your feedback and entrusting us with your care. If you get a [facility] patient survey, I would appreciate you taking the time to let us know about your experience today. Thank you!  As of August 04, 2019, your lab results will be released to your MyChart immediately, before I even have a chance to see them. Please give me time to review them and contact you if there are any abnormalities. Thank you for your patience.  

## 2019-12-18 ENCOUNTER — Telehealth: Payer: Self-pay | Admitting: Obstetrics and Gynecology

## 2019-12-18 LAB — URINE CULTURE

## 2019-12-18 MED ORDER — CIPROFLOXACIN HCL 500 MG PO TABS: 500.0000 mg | ORAL_TABLET | Freq: Two times a day (BID) | ORAL | 0 refills | Status: AC

## 2019-12-18 NOTE — Telephone Encounter (Signed)
Rx cipro for recurrent UTI.  

## 2019-12-21 ENCOUNTER — Ambulatory Visit: Payer: BC Managed Care – PPO | Admitting: Urology

## 2019-12-30 ENCOUNTER — Other Ambulatory Visit: Payer: Self-pay

## 2019-12-30 MED ORDER — BUPROPION HCL ER (XL) 300 MG PO TB24: 300.0000 mg | ORAL_TABLET | Freq: Every day | ORAL | 0 refills | Status: DC

## 2020-01-03 ENCOUNTER — Other Ambulatory Visit: Payer: Self-pay

## 2020-01-03 ENCOUNTER — Encounter: Payer: Self-pay | Admitting: Urology

## 2020-01-03 ENCOUNTER — Ambulatory Visit: Payer: BC Managed Care – PPO | Admitting: Urology

## 2020-01-03 VITALS — BP 105/72 | HR 90 | Ht 62.0 in | Wt 169.0 lb

## 2020-01-03 DIAGNOSIS — N939 Abnormal uterine and vaginal bleeding, unspecified: Secondary | ICD-10-CM | POA: Diagnosis not present

## 2020-01-03 DIAGNOSIS — N200 Calculus of kidney: Secondary | ICD-10-CM

## 2020-01-03 DIAGNOSIS — N1330 Unspecified hydronephrosis: Secondary | ICD-10-CM

## 2020-01-03 DIAGNOSIS — R3915 Urgency of urination: Secondary | ICD-10-CM | POA: Diagnosis not present

## 2020-01-03 LAB — BLADDER SCAN AMB NON-IMAGING: Scan Result: 21

## 2020-01-03 MED ORDER — MIRABEGRON ER 25 MG PO TB24: 25.0000 mg | ORAL_TABLET | Freq: Every day | ORAL | 0 refills | Status: DC

## 2020-01-03 NOTE — Progress Notes (Signed)
01/03/2020 10:20 PM   Ariana Weeks [birth year] [id number]  Referring provider: [name], MD [place],  [place] [address]  Chief Complaint  Patient presents with  . Nephrolithiasis    HPI: Ariana Weeks is a 49 year old female with rUTI's, nephrolithiasis and hematuria who is referred by Fraser Din, PA-C for further evaluation.  rUTI's + E. Coli on 12/15/2019 + E. Coli on 09/22/2019  She is experiencing frequency, urinary urgency and a pain shooting up into her bladder.   She has been having the symptoms since December.  She states and January she was diagnosed with a UTI.  She was prescribed an antibiotic, but her symptoms did not abate.  She then went and sought treatment again in April and was diagnosed with another UTI, she was treated with antibiotics but again the symptoms did not abate.  The pain is described as sharp.  She is not having these symptoms during the night.  The pain is intense.  It causes her to urinate.  She said sometimes she urinates small amounts and sometimes she urinates adequate amount.  She denies any gross hematuria.  She is having spotting due to her weight loss with her IUD.  Crossing her legs helps the pain.  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  Her PVR is 21 mL.   Today's UA yellow slightly cloudy with 11-30 WBCs, 0-2 RBCs and 0-10 epithelial cells.  Nephrolithiasis RUS 06/26/2017 noted bilateral stones  Gross hematuria Likely spotting DDx IUD.  Today's UA is negative for microscopic hematuria.    PMH: Past Medical History:  Diagnosis Date  . BRCA negative 02/2019   MyRisk neg except AXIN2 VUS  . Depression   . Head cold   . History of frequent urinary tract infections   . History of kidney stones   . Hypertension   . Increased risk of breast cancer 02/2019   IBIS=66% due to hx of lobular CIS  . Kidney stones   . Left  ureteral calculus   . 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 [name]  . Sleep apnea    no- c-pap    Surgical History: Past Surgical History:  Procedure Laterality Date  . BREAST BIOPSY Right 02/02/2013   [place] bx/clip-neg  . BREAST BIOPSY Right 02/19/2017   affirm, ATYPICAL LOBULAR HYPERPLASIA  . BREAST BIOPSY Right 03/04/2017   Procedure: BREAST BIOPSY WITH NEEDLE LOCALIZATION;  Surgeon: [name], MD;  Location: [facility] ORS;  Service: General;  Laterality: Right;  . BREAST LUMPECTOMY Right 2018  . CESAREAN SECTION  2005  . CHOLECYSTECTOMY  09/2012  . CYSTOSCOPY W/ RETROGRADES N/A 02/02/2017   Procedure: CYSTOSCOPY WITH RETROGRADE PYELOGRAM;  Surgeon: [name], MD;  Location: [facility];  Service: Urology;  Laterality: N/A;  . EXTRACORPOREAL SHOCK WAVE LITHOTRIPSY  2009  . INTRAUTERINE DEVICE INSERTION  MARCH 2012   MURINA  . LAPAROSCOPIC GASTRIC SLEEVE RESECTION N/A 08/09/2019   Procedure: LAPAROSCOPIC GASTRIC SLEEVE RESECTION, Upper Endo, ERAS [place];  Surgeon: [name], MD;  Location: [facility];  Service: General;  Laterality: N/A;  . [place]  (APPROX)   Kidney Stone  . URETEROSCOPY WITH HOLMIUM LASER LITHOTRIPSY Left 02/02/2017   Procedure: URETEROSCOPY WITH HOLMIUM LASER LITHOTRIPSY;  Surgeon: [name], MD;  Location: [facility];  Service:  Urology;  Laterality: Left;    Home Medications:  Current Outpatient Medications on File Prior to Visit  Medication Sig Dispense Refill  . buPROPion (WELLBUTRIN XL) 300 MG 24 hr tablet Take 1 tablet (300 mg total) by mouth daily. 90 tablet 0  . hydrochlorothiazide (HYDRODIURIL) 25 MG tablet Take 1 tablet (25 mg total) by mouth daily. 90 tablet 3  . Multiple Vitamins-Minerals (BARIATRIC FUSION) CHEW Chew 1 each by mouth 2 (two) times daily.    Marland Kitchen PARoxetine (PAXIL) 10 MG tablet TAKE 1 TABLET(10 MG) BY MOUTH DAILY 90 tablet 1  . raloxifene (EVISTA) 60 MG tablet TAKE 1 TABLET(60 MG) BY MOUTH  DAILY (Patient taking differently: Take 60 mg by mouth daily. ) 30 tablet 12  . traMADol (ULTRAM) 50 MG tablet Take 1 tablet (50 mg total) by mouth every 6 (six) hours as needed (pain). 10 tablet 0  . levonorgestrel (MIRENA) 20 MCG/24HR IUD 1 each by Intrauterine route once.    . pantoprazole (PROTONIX) 40 MG tablet Take 1 tablet (40 mg total) by mouth daily. (Patient not taking: Reported on 12/14/2019) 90 tablet 0   No current facility-administered medications on file prior to visit.    Allergies:  Allergies  Allergen Reactions  . Dilaudid [Hydromorphone Hcl] Itching    Red face and itching  . Azithromycin     Other reaction(s): Unknown  . Nitrofurantoin Hives    Macrobid  . Morphine And Related Hives and Rash    Family History: Family History  Problem Relation Age of Onset  . Lung cancer Father   . Other Mother        breast cyst  . Breast cancer Mother 74       tested  . Alcohol abuse Maternal Uncle   . Alcohol abuse Paternal Uncle   . Ovarian cancer Maternal Grandmother 76  . Lymphoma Maternal Grandfather        cancer  . Breast cancer Maternal Aunt [address] late 67s  . Alcohol abuse Paternal Grandfather     Social History:  reports that she quit smoking about 5 years ago. Her smoking use included cigarettes. She has a 8.00 pack-year smoking history. She has never used smokeless tobacco. She reports current alcohol use. She reports that she does not use drugs.  ROS: Pertinent ROS in HPI  Physical Exam: BP 105/72   Pulse 90   Ht 5' 2"  (1.575 m)   Wt 169 lb (76.7 kg)   BMI 30.91 kg/m   Constitutional:  Well nourished. Alert and oriented, No acute distress. HEENT: [place] AT, mask in place.  Trachea midline Cardiovascular: No clubbing, cyanosis, or edema. Respiratory: Normal respiratory effort, no increased work of breathing. Neurologic: Grossly intact, no focal deficits, moving all 4 extremities. Psychiatric: Normal mood and affect.  Laboratory  Data: Lab Results  Component Value Date   WBC 11.0 (H) 08/10/2019   HGB 12.4 08/10/2019   HCT 38.3 08/10/2019   MCV 93.4 08/10/2019   PLT 232 08/10/2019    Lab Results  Component Value Date   CREATININE 0.77 08/09/2019    Lab Results  Component Value Date   HGBA1C 5.3 06/09/2018    Lab Results  Component Value Date   TSH 2.25 08/03/2018       Component Value Date/Time   CHOL 195 05/28/2018 1125   HDL 40.40 05/28/2018 1125   CHOLHDL 5 05/28/2018 1125   VLDL 61.2 (H) 05/28/2018 1125  Lab Results  Component Value Date   AST 31 08/09/2019   Lab Results  Component Value Date   ALT 33 08/09/2019    Urinalysis Component     Latest Ref Rng & Units 01/03/2020  Specific Gravity, UA     1.005 - 1.030 1.025  pH, UA     5.0 - 7.5 6.0  Color, UA     Yellow Yellow  Appearance Ur     Clear Hazy (A)  Leukocytes,UA     Negative Negative  Protein,UA     Negative/Trace Negative  Glucose, UA     Negative Negative  Ketones, UA     Negative Negative  RBC, UA     Negative Negative  Bilirubin, UA     Negative Negative  Urobilinogen, Ur     0.2 - 1.0 mg/dL 0.2  Nitrite, UA     Negative Negative  Microscopic Examination      See below:   Component     Latest Ref Rng & Units 01/03/2020  WBC, UA     0 - 5 /hpf 11-30 (A)  RBC     0 - 2 /hpf 0-2  Epithelial Cells (non renal)     0 - 10 /hpf 0-10  Bacteria, UA     None seen/Few None seen   I have reviewed the labs.   Pertinent Imaging: Results for TWILA, RAPPA (MRN [id number]) as of 01/03/2020 09:36  Ref. Range 01/03/2020 08:50  Scan Result Unknown 21    Assessment & Plan:    1. Urinary urgency - Urinalysis, Complete - CULTURE, URINE COMPREHENSIVE - Bladder Scan (Post Void Residual) in office - Given samples of Myrbetriq 25 mg, #28 to take daily, if KUB is negative for nephrolithiasis we will have patient return in 3 weeks for OAB questionnaire and PVR - If no improvement with Myrbetriq, will consider  cystoscopy in the future  2. Nephrolithiasis - Patient with bilateral nephrolithiasis on 2018 renal ultrasound - We will obtain KUB to evaluate for a distal ureteral stone that may be contributing to her urinary symptoms  3. Hematuria -Secondarily to IUD or UTI -Today's UA is negative for micro heme -We will continue to monitor  Return for follow up pending KUB results .  These notes generated with voice recognition software. I apologize for typographical errors.  Zara Council, PA-C  [facility] [address]  [place], [place] [address] [phone]

## 2020-01-04 LAB — URINALYSIS, COMPLETE
Bilirubin, UA: NEGATIVE
Glucose, UA: NEGATIVE
Ketones, UA: NEGATIVE
Leukocytes,UA: NEGATIVE
Nitrite, UA: NEGATIVE
Protein,UA: NEGATIVE
RBC, UA: NEGATIVE
Specific Gravity, UA: 1.025 (ref 1.005–1.030)
Urobilinogen, Ur: 0.2 mg/dL (ref 0.2–1.0)
pH, UA: 6 (ref 5.0–7.5)

## 2020-01-04 LAB — MICROSCOPIC EXAMINATION: Bacteria, UA: NONE SEEN

## 2020-01-06 ENCOUNTER — Ambulatory Visit
Admission: RE | Admit: 2020-01-06 | Discharge: 2020-01-06 | Disposition: A | Discharge status: Home / Self Care | Payer: BC Managed Care – PPO | Source: Ambulatory Visit | Attending: Urology | Admitting: Urology

## 2020-01-06 DIAGNOSIS — N200 Calculus of kidney: Secondary | ICD-10-CM | POA: Diagnosis present

## 2020-01-07 LAB — CULTURE, URINE COMPREHENSIVE

## 2020-01-09 ENCOUNTER — Other Ambulatory Visit: Payer: Self-pay | Admitting: Urology

## 2020-01-09 ENCOUNTER — Telehealth: Payer: Self-pay | Admitting: Family Medicine

## 2020-01-09 DIAGNOSIS — N200 Calculus of kidney: Secondary | ICD-10-CM

## 2020-01-09 NOTE — Telephone Encounter (Signed)
-----   Message from Harle Battiest, PA-C sent at 01/09/2020  8:06 AM EDT ----- Please let Ariana Weeks know that no stones were seen in the vicinity of the ureter, but they were stones seen in the left kidney.  The stones in the left kidney would not be causing her bladder symptoms.  Is the Myrbetriq 25 mg samples helping?

## 2020-01-09 NOTE — Telephone Encounter (Signed)
Spoke to patient and she is willing to have the RUS done.

## 2020-01-09 NOTE — Telephone Encounter (Signed)
Let's get a RUS to check for hydronephrosis to make sure she does not have a distal ureteral stone.

## 2020-01-09 NOTE — Telephone Encounter (Signed)
I have placed the order for the RUS.

## 2020-01-09 NOTE — Telephone Encounter (Signed)
Patient notified and voiced understanding.

## 2020-01-09 NOTE — Telephone Encounter (Signed)
Patient notified and she states the Myrbetriq is not helping and she is still having the bladder discomfort.

## 2020-01-09 NOTE — Telephone Encounter (Signed)
-----   Message from Harle Battiest, PA-C sent at 01/09/2020  8:15 AM EDT ----- Please let Mrs. Medley know that her urine culture was negative for infection.

## 2020-01-11 ENCOUNTER — Other Ambulatory Visit: Payer: Self-pay | Admitting: General Surgery

## 2020-01-11 DIAGNOSIS — D0501 Lobular carcinoma in situ of right breast: Secondary | ICD-10-CM

## 2020-01-22 ENCOUNTER — Telehealth: Payer: Self-pay | Admitting: Urology

## 2020-01-22 NOTE — Telephone Encounter (Signed)
Please call Ariana Weeks and have her schedule a follow up appointment for follow up for the Myrbetriq samples.

## 2020-01-24 NOTE — Telephone Encounter (Signed)
LMOM to c/b to schedule appointment.

## 2020-02-01 ENCOUNTER — Ambulatory Visit: Payer: BC Managed Care – PPO

## 2020-02-08 ENCOUNTER — Encounter: Payer: BC Managed Care – PPO | Attending: Surgery | Admitting: Dietician

## 2020-02-08 ENCOUNTER — Encounter: Payer: Self-pay | Admitting: Dietician

## 2020-02-08 ENCOUNTER — Other Ambulatory Visit: Payer: Self-pay

## 2020-02-08 VITALS — Ht 62.0 in | Wt 166.4 lb

## 2020-02-08 DIAGNOSIS — Z713 Dietary counseling and surveillance: Secondary | ICD-10-CM | POA: Insufficient documentation

## 2020-02-08 DIAGNOSIS — E6609 Other obesity due to excess calories: Secondary | ICD-10-CM | POA: Diagnosis not present

## 2020-02-08 DIAGNOSIS — Z6830 Body mass index (BMI) 30.0-30.9, adult: Secondary | ICD-10-CM

## 2020-02-08 DIAGNOSIS — Z6841 Body Mass Index (BMI) 40.0 and over, adult: Secondary | ICD-10-CM | POA: Insufficient documentation

## 2020-02-08 NOTE — Progress Notes (Signed)
Follow-up visit:  6 Months Post-Operative Sleeve Gastrectomy Surgery  Medical Nutrition Therapy:  Appt start time: 1035 end time:  1115.  Primary concerns today: Post-operative Bariatric Surgery Nutrition Management.   Learning Readiness:   Change in progress  Weight: 166.4lbs Height: 5'2" Weight at previous NDES visit: 179.2lbs 12/07/19 Patient declined body comp measurement today    Progress:  Weight loss of about 13lbs since 12/07/19.   Patient reports improvement in constipation, is using Miralax daily.  She continues to crave crunchy foods such as crackers or nuts, but has avoided them so far.   Dietary recall: Breakfast: protein shake or low-sugar oatmeal Snack: protein bar (Think or Atkins); carrots or strawberries  Lunch: leftovers; low carb mini tortilla with [place]; healthy choice meal Snack: occasionally carrots, berries or another protein bar or protein cookie (Quest) Dinner: meat + low carb veggies or peas or corn Snack: berries with sugar free whipped cream; remainder of dinner; 1/2 Quest cookie  Fluid intake: several 24oz bottles water Estimated total protein intake: 2oz meat portions x2 = 28g + 20g protein bars/ cookies + breakfast = 5-30g total 60-80g  Medications: buPROPion, HCTZ, levonorgestrel IUD, metoprolol, pantoprazole, PARoxetine, raloxifene, telmisartan Supplementation: bariatric capsule multivitamin 2x daily + forgets calcium; pllanning to begin using Tums at least 2x daily  Using straws: no Drinking while eating: no Hair loss: unsure; hair feels thinner Carbonated beverages: no N/V/D/C: no constipation when taking Miralax daily Dumping syndrome: no   Recent physical activity:  Walking 30-35 minutes, 4-5 days per week  Progress Towards Goal(s):  In progress.  Handouts given during visit include:  Phase 6 bariatric diet handout  800-1200kcal bariatric menus (AND)  Goals and Instructions (AVS)   Nutritional Diagnosis:  [place]-3.3  Overweight/obesity As related to history of excess calories and inadequate physical activity.  As evidenced by patient with current BMI of 30.4, following bariatric diet guidelines for ongoing weight loss after sleeve gastrectomy.    Intervention:    Reviewed progress since previous visit.  Discussed advancement of diet to include healthy choices from all food groups, and importance of lifelong adherence to low fat and low sugar, mostly whole food choices, and small portions.   Also reviewed importance of lifelong vitamin and mineral supplementation.   Scheduled follow-up for 10-11 months post-op rather than both 9- and 20-month visits, per patient preference.  Teaching Method Utilized:  Visual Auditory Hands on  Barriers to learning/adherence to lifestyle change: none  Demonstrated degree of understanding via:  Teach Back   Monitoring/Evaluation:  Dietary intake, exercise, and body weight.     Follow up 06/20/20 at 4:30pm for 10-11 month post-op visit.

## 2020-02-08 NOTE — Patient Instructions (Signed)
·   Continue with healthy food choices and limiting carbs, great job!  Add some healthy crunchy foods such as nuts, whole grain cracker(s) or crispbread. Add protein such as low fat cheese or peanut butter to a cracker. Keep looking for a low-carb version that works for you.  Consider gradually adding some strength or resistance exercises 2 days a week to maintain or increase lean muscle weight.

## 2020-02-09 ENCOUNTER — Ambulatory Visit: Payer: BC Managed Care – PPO | Admitting: Urology

## 2020-02-22 ENCOUNTER — Ambulatory Visit
Admission: RE | Admit: 2020-02-22 | Discharge: 2020-02-22 | Disposition: A | Discharge status: Home / Self Care | Payer: BC Managed Care – PPO | Source: Ambulatory Visit | Attending: General Surgery | Admitting: General Surgery

## 2020-02-22 DIAGNOSIS — Z86000 Personal history of in-situ neoplasm of breast: Secondary | ICD-10-CM | POA: Insufficient documentation

## 2020-02-22 DIAGNOSIS — Z1231 Encounter for screening mammogram for malignant neoplasm of breast: Secondary | ICD-10-CM | POA: Diagnosis present

## 2020-02-22 DIAGNOSIS — D0501 Lobular carcinoma in situ of right breast: Secondary | ICD-10-CM

## 2020-02-22 DIAGNOSIS — R921 Mammographic calcification found on diagnostic imaging of breast: Secondary | ICD-10-CM | POA: Diagnosis not present

## 2020-02-24 ENCOUNTER — Other Ambulatory Visit: Payer: Self-pay | Admitting: General Surgery

## 2020-02-24 DIAGNOSIS — R928 Other abnormal and inconclusive findings on diagnostic imaging of breast: Secondary | ICD-10-CM

## 2020-02-24 DIAGNOSIS — R921 Mammographic calcification found on diagnostic imaging of breast: Secondary | ICD-10-CM

## 2020-02-29 ENCOUNTER — Telehealth: Payer: Self-pay | Admitting: *Deleted

## 2020-02-29 NOTE — Telephone Encounter (Signed)
VM TO PT TO SCHD AV 

## 2020-03-01 ENCOUNTER — Other Ambulatory Visit: Payer: Self-pay | Admitting: General Surgery

## 2020-03-01 DIAGNOSIS — R920 Mammographic microcalcification found on diagnostic imaging of breast: Secondary | ICD-10-CM

## 2020-03-08 ENCOUNTER — Other Ambulatory Visit: Payer: Self-pay

## 2020-03-08 MED ORDER — PAROXETINE HCL 10 MG PO TABS: ORAL_TABLET | ORAL | 1 refills | Status: DC

## 2020-03-09 ENCOUNTER — Ambulatory Visit
Admission: RE | Admit: 2020-03-09 | Discharge: 2020-03-09 | Disposition: A | Discharge status: Home / Self Care | Payer: BC Managed Care – PPO | Source: Ambulatory Visit | Attending: General Surgery | Admitting: General Surgery

## 2020-03-09 DIAGNOSIS — R928 Other abnormal and inconclusive findings on diagnostic imaging of breast: Secondary | ICD-10-CM | POA: Diagnosis not present

## 2020-03-09 DIAGNOSIS — R921 Mammographic calcification found on diagnostic imaging of breast: Secondary | ICD-10-CM | POA: Insufficient documentation

## 2020-04-01 ENCOUNTER — Encounter: Payer: Self-pay | Admitting: Urology

## 2020-04-16 ENCOUNTER — Telehealth: Payer: Self-pay | Admitting: Internal Medicine

## 2020-04-16 MED ORDER — PAROXETINE HCL 10 MG PO TABS: ORAL_TABLET | ORAL | 1 refills | Status: DC

## 2020-04-16 NOTE — Telephone Encounter (Signed)
Patient called in need refill on PARoxetine (PAXIL) 10 MG tablet

## 2020-05-04 ENCOUNTER — Ambulatory Visit: Admission: RE | Admit: 2020-05-04 | Payer: BC Managed Care – PPO | Source: Ambulatory Visit

## 2020-05-11 ENCOUNTER — Ambulatory Visit: Payer: BC Managed Care – PPO

## 2020-05-14 ENCOUNTER — Other Ambulatory Visit: Payer: Self-pay | Admitting: Internal Medicine

## 2020-05-17 ENCOUNTER — Ambulatory Visit: Payer: BC Managed Care – PPO | Admitting: Urology

## 2020-05-24 ENCOUNTER — Ambulatory Visit
Admission: RE | Admit: 2020-05-24 | Discharge: 2020-05-24 | Disposition: A | Discharge status: Home / Self Care | Payer: BC Managed Care – PPO | Source: Ambulatory Visit | Attending: Urology | Admitting: Urology

## 2020-05-24 ENCOUNTER — Other Ambulatory Visit: Payer: Self-pay

## 2020-05-24 DIAGNOSIS — N200 Calculus of kidney: Secondary | ICD-10-CM | POA: Insufficient documentation

## 2020-05-31 ENCOUNTER — Encounter (HOSPITAL_COMMUNITY): Payer: Self-pay

## 2020-06-06 ENCOUNTER — Encounter: Payer: Self-pay | Admitting: Urology

## 2020-06-06 ENCOUNTER — Other Ambulatory Visit: Payer: Self-pay

## 2020-06-06 ENCOUNTER — Ambulatory Visit (INDEPENDENT_AMBULATORY_CARE_PROVIDER_SITE_OTHER): Payer: BC Managed Care – PPO | Admitting: Urology

## 2020-06-06 VITALS — BP 103/69 | HR 79 | Temp 98.0°F | Ht 62.0 in | Wt 170.6 lb

## 2020-06-06 DIAGNOSIS — N200 Calculus of kidney: Secondary | ICD-10-CM | POA: Diagnosis not present

## 2020-06-06 DIAGNOSIS — R3915 Urgency of urination: Secondary | ICD-10-CM

## 2020-06-06 LAB — BLADDER SCAN AMB NON-IMAGING: Scan Result: 42

## 2020-06-06 NOTE — Progress Notes (Signed)
06/06/2020 4:09 PM   Ariana Weeks 01-16-1971 [id number]  Referring provider: [name], MD [place],  [place] [address]  Chief Complaint  Patient presents with  . Follow-up    HPI: Mrs. Keeven is a 49 year old female with rUTI's, nephrolithiasis and hematuria who presents today to review renal ultrasound results.    rUTI's Date Organism Antibiotic  09/22/2019 E.coli - pan sensitive Keflex 500 mg, BID x 7days  12/15/2019 E.coli - pan senisitve Cipro 500 mg, BID x 5 days    She continues to have the frequency, urgency and intermittent pain shooting up into her bladder.  The pain is intense and sharp.  The pain results in urge incontinence.  She does not have these symptoms at night.  She does drink a lot of water as instructed by her bariatric provider.  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  These symptoms started in December.  In January, she had a positive urine culture for pansensitive E. coli and was prescribed the appropriate antibiotic.  There were no improvements in her symptoms.   She attempted to manage her symptoms conservatively, but she sought treatment again in April.   She again had a positive urine culture for pan sensitive E.coli and treated with culture appropriate antibiotic.  And again, her symptoms did not improve.  I gave her a trial of Myrbetriq 25 mg, but she found that ineffective in controlling her symptoms as well.  KUB 12/2019 pelvic phleboliths present and punctate stones in the lower left pole  RUS 04/2020 6 mm stone in the right kidney, but no hydronephrosis is noted  Her PVR is 42 mL.     Nephrolithiasis RUS 05/24/2020 - no hydro                             - 6 mm right renal stone   PMH: Past Medical History:  Diagnosis Date  . BRCA negative 02/2019   MyRisk neg except AXIN2 VUS  . Depression   . Head cold   . History of frequent urinary tract infections   . History of kidney stones   .  Hypertension   . Increased risk of breast cancer 02/2019   IBIS=66% due to hx of lobular CIS  . Kidney stones   . Left ureteral calculus   . 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 [name]  . Sleep apnea    no- c-pap    Surgical History: Past Surgical History:  Procedure Laterality Date  . BREAST BIOPSY Right 02/02/2013   [place] bx/clip-neg  . BREAST BIOPSY Right 02/19/2017   affirm, ATYPICAL LOBULAR HYPERPLASIA  . BREAST BIOPSY Right 03/04/2017   Procedure: BREAST BIOPSY WITH NEEDLE LOCALIZATION;  Surgeon: [name], MD;  Location: [facility] ORS;  Service: General;  Laterality: Right;  . BREAST LUMPECTOMY Right 2018  . CESAREAN SECTION  2005  . CHOLECYSTECTOMY  09/2012  . CYSTOSCOPY W/ RETROGRADES N/A 02/02/2017   Procedure: CYSTOSCOPY WITH RETROGRADE PYELOGRAM;  Surgeon: [name], MD;  Location: [facility] ORS;  Service: Urology;  Laterality: N/A;  . EXTRACORPOREAL SHOCK WAVE LITHOTRIPSY  2009  . INTRAUTERINE DEVICE INSERTION  MARCH 2012   MURINA  . LAPAROSCOPIC GASTRIC SLEEVE RESECTION N/A 08/09/2019   Procedure: LAPAROSCOPIC GASTRIC SLEEVE RESECTION, Upper Endo, ERAS [place];  Surgeon: [name], MD;  Location: [facility];  Service: General;  Laterality: N/A;  . [place]  (APPROX)   Kidney Stone  . URETEROSCOPY WITH HOLMIUM LASER LITHOTRIPSY Left 02/02/2017   Procedure: URETEROSCOPY WITH HOLMIUM LASER LITHOTRIPSY;  Surgeon: [name], MD;  Location: [facility];  Service: Urology;  Laterality: Left;    Home Medications:  Current Outpatient Medications on File Prior to Visit  Medication Sig Dispense Refill  . buPROPion (WELLBUTRIN XL) 300 MG 24 hr tablet Take 1 tablet (300 mg total) by mouth daily. 90 tablet 0  . hydrochlorothiazide (HYDRODIURIL) 25 MG tablet TAKE 1 TABLET(25 MG) BY MOUTH DAILY 90 tablet 3  . mirabegron ER (MYRBETRIQ) 25 MG TB24 tablet Take 1 tablet (25 mg total) by mouth daily. 28 tablet 0  . Multiple Vitamins-Minerals  (BARIATRIC FUSION) CHEW Chew 1 each by mouth 2 (two) times daily.    Marland Kitchen PARoxetine (PAXIL) 10 MG tablet TAKE 1 TABLET(10 MG) BY MOUTH DAILY 90 tablet 1  . raloxifene (EVISTA) 60 MG tablet TAKE 1 TABLET(60 MG) BY MOUTH DAILY (Patient taking differently: Take 60 mg by mouth daily. ) 30 tablet 12  . traMADol (ULTRAM) 50 MG tablet Take 1 tablet (50 mg total) by mouth every 6 (six) hours as needed (pain). 10 tablet 0  . levonorgestrel (MIRENA) 20 MCG/24HR IUD 1 each by Intrauterine route once.     No current facility-administered medications on file prior to visit.    Allergies:  Allergies  Allergen Reactions  . Dilaudid [Hydromorphone Hcl] Itching    Red face and itching  . Azithromycin     Other reaction(s): Unknown  . Nitrofurantoin Hives    Macrobid  . Morphine And Related Hives and Rash    Family History: Family History  Problem Relation Age of Onset  . Lung cancer Father   . Other Mother        breast cyst  . Breast cancer Mother 21       tested  . Alcohol abuse Maternal Uncle   . Alcohol abuse Paternal Uncle   . Ovarian cancer Maternal Grandmother 76  . Lymphoma Maternal Grandfather        cancer  . Breast cancer Maternal Aunt [address] late 78s  . Alcohol abuse Paternal Grandfather     Social History:  reports that she quit smoking about 5 years ago. Her smoking use included cigarettes. She has a 8.00 pack-year smoking history. She has never used smokeless tobacco. She reports current alcohol use. She reports that she does not use drugs.  ROS: Pertinent ROS in HPI  Physical Exam: BP 103/69   Pulse 79   Temp 98 F (36.7 C)   Ht _0  (1.575 m)   Wt 170 lb 9.6 oz (77.4 kg)   BMI 31.20 kg/m   Constitutional:  Well nourished. Alert and oriented, No acute distress. HEENT: [place] AT, mask in place.  Trachea midline Cardiovascular: No clubbing, cyanosis, or edema. Respiratory: Normal respiratory effort, no increased work of breathing. Neurologic: Grossly  intact, no focal deficits, moving all 4 extremities. Psychiatric: Normal mood and affect.   Laboratory Data: Lab Results  Component Value Date   WBC 11.0 (H) 08/10/2019   HGB 12.4 08/10/2019   HCT 38.3 08/10/2019   MCV 93.4 08/10/2019   PLT 232 08/10/2019    Lab Results  Component Value Date   CREATININE 0.77 08/09/2019    Lab Results  Component Value Date   HGBA1C 5.3 06/09/2018  Lab Results  Component Value Date   TSH 2.25 08/03/2018       Component Value Date/Time   CHOL 195 05/28/2018 1125   HDL 40.40 05/28/2018 1125   CHOLHDL 5 05/28/2018 1125   VLDL 61.2 (H) 05/28/2018 1125    Lab Results  Component Value Date   AST 31 08/09/2019   Lab Results  Component Value Date   ALT 33 08/09/2019    Urinalysis Component     Latest Ref Rng & Units 01/03/2020  Specific Gravity, UA     1.005 - 1.030 1.025  pH, UA     5.0 - 7.5 6.0  Color, UA     Yellow Yellow  Appearance Ur     Clear Hazy (A)  Leukocytes,UA     Negative Negative  Protein,UA     Negative/Trace Negative  Glucose, UA     Negative Negative  Ketones, UA     Negative Negative  RBC, UA     Negative Negative  Bilirubin, UA     Negative Negative  Urobilinogen, Ur     0.2 - 1.0 mg/dL 0.2  Nitrite, UA     Negative Negative  Microscopic Examination      See below:   Component     Latest Ref Rng & Units 01/03/2020  WBC, UA     0 - 5 /hpf 11-30 (A)  RBC     0 - 2 /hpf 0-2  Epithelial Cells (non renal)     0 - 10 /hpf 0-10  Bacteria, UA     None seen/Few None seen   I have reviewed the labs.   Pertinent Imaging: Results for AXIE, HAYNE (MRN [id number]) as of 06/07/2020 13:04  Ref. Range 06/06/2020 15:59  Scan Result Unknown 42     Assessment & Plan:    1. Urinary urgency/bladder pain We will schedule a cystoscopy for further evaluation of her urinary symptoms as antibiotics or Myrbetriq have failed to alleviate her of her pain I have explained to the patient that they will   be scheduled for a cystoscopy in our office to evaluate their bladder.  The cystoscopy consists of passing a tube with a lens up through their urethra and into their urinary bladder.   We will inject the urethra with a lidocaine gel prior to introducing the cystoscope to help with any discomfort during the procedure.   After the procedure, they might experience blood in the urine and discomfort with urination.  This will abate after the first few voids.  I have  encouraged the patient to increase water intake  during this time.  Patient denies any allergies to lidocaine.  If cystoscopy is negative, may need to consider a CT Renal stone study for a more detailed evaluation for a possible ureteral stone  2. Nephrolithiasis - Patient with bilateral nephrolithiasis on 2018 renal ultrasound - We will obtain KUB to evaluate for a distal ureteral stone that may be contributing to her urinary symptoms   Return for cystoscopy for bladder pain.  These notes generated with voice recognition software. I apologize for typographical errors.  Zara Council, PA-C  [facility] [address]  [place], [place] [address] [phone]

## 2020-06-08 ENCOUNTER — Other Ambulatory Visit: Payer: Self-pay

## 2020-06-08 ENCOUNTER — Ambulatory Visit (INDEPENDENT_AMBULATORY_CARE_PROVIDER_SITE_OTHER): Payer: BC Managed Care – PPO | Admitting: Urology

## 2020-06-08 ENCOUNTER — Encounter: Payer: Self-pay | Admitting: Urology

## 2020-06-08 VITALS — BP 106/72 | HR 76 | Ht 62.0 in | Wt 170.0 lb

## 2020-06-08 DIAGNOSIS — R102 Pelvic and perineal pain: Secondary | ICD-10-CM

## 2020-06-08 DIAGNOSIS — R3915 Urgency of urination: Secondary | ICD-10-CM

## 2020-06-08 MED ORDER — CEPHALEXIN 250 MG PO CAPS
500.0000 mg | ORAL_CAPSULE | Freq: Once | ORAL | Status: AC
  Administered 2020-06-08: 500 mg via ORAL

## 2020-06-08 MED ORDER — LIDOCAINE HCL URETHRAL/MUCOSAL 2 % EX GEL
1.0000 "application " | Freq: Once | CUTANEOUS | Status: AC
  Administered 2020-06-08: 1 via URETHRAL

## 2020-06-08 NOTE — Progress Notes (Signed)
   06/08/20  CC:  Chief Complaint  Patient presents with  . Cysto    HPI:  Ariana Weeks presents today for cystoscopy.  She has a history of urinary tract infection, kidney stones. She had frequency and urgency recently.  Also some urge incontinence.  Antibiotics have not helped her symptoms.  KUB May 2021 with punctate left lower pole stones. She underwent a renal ultrasound September 2021 which was benign and may have shown a 6 mm right renal stone.    UA today with no white cells, no red cells, negative nitrite, negative leukocyte, moderate bacteria, yeast. Last cx mixed.   She tried Myrbetriq. Her main bother is pain in her "girl parts".    Blood pressure 106/72, pulse 76, height 5\' 2"  (1.575 m), weight 170 lb (77.1 kg). NED. A&Ox3.   No respiratory distress   Abd soft, NT, ND Normal external genitalia with patent urethral meatus Bladder and urethra palpably normal and non-tender  Meatus normal  No prolapse Pelvic floor nontender  Cystoscopy Procedure Note  Patient identification was confirmed, informed consent was obtained, and patient was prepped using Betadine solution.  Lidocaine jelly was administered per urethral meatus.    Procedure: - Flexible cystoscope introduced, without any difficulty.   - Thorough search of the bladder revealed:    normal urethral meatus    normal urothelium    no stones    no ulcers     no tumors    no urethral polyps    no trabeculation  - Ureteral orifices were normal in position and appearance.  Post-Procedure: - Patient tolerated the procedure well  was chaperone.  Assessment/ Plan:  Pelvic pain, urgency  -her cystoscopy was normal and bladder and urethra palpably normal and nontender on exam.  Pelvic floor physical therapy might help or CT scanning of the abdomen and pelvis could be considered.  She will return to see PA McGowan in a few weeks.  No follow-ups on file.  Edson Snowball, MD

## 2020-06-08 NOTE — Patient Instructions (Signed)
Pelvic Floor Dysfunction  Pelvic floor dysfunction (PFD) is a condition that results when the group of muscles and connective tissues that support the organs in the pelvis (pelvic floor muscles) do not work well. These muscles and their connections form a sling that supports the colon and bladder. In men, these muscles also support the prostate gland. In women, they also support the uterus. PFD causes pelvic floor muscles to be too weak, too tight, or a combination of both. In PFD, muscle movements are not coordinated. This condition may cause bowel or bladder problems. It may also cause pain. What are the causes? This condition may be caused by an injury to the pelvic area or by a weakening of pelvic muscles. This often results from pregnancy and childbirth or other types of strain. In many cases, the exact cause is not known. What increases the risk? The following factors may make you more likely to develop this condition:  Having a condition of chronic bladder tissue inflammation (interstitial cystitis).  Being an older person.  Being overweight.  Radiation treatment for cancer in the pelvic region.  Previous pelvic surgery, such as removal of the uterus (hysterectomy) or prostate gland (prostatectomy). What are the signs or symptoms? Symptoms of this condition vary and may include:  Bladder symptoms, such as: ? Trouble starting urination and emptying the bladder. ? Frequent urinary tract infections. ? Leaking urine when coughing, laughing, or exercising (stress incontinence). ? Having to pass urine urgently or frequently. ? Pain when passing urine.  Bowel symptoms, such as: ? Constipation. ? Urgent or frequent bowel movements. ? Incomplete bowel movements. ? Painful bowel movements. ? Leaking stool or gas.  Unexplained genital or rectal pain.  Genital or rectal muscle spasms.  Low back pain. In women, symptoms of PFD may also include:  A heavy, full, or aching feeling in  the vagina.  A bulge that protrudes into the vagina.  Pain during or after sexual intercourse. How is this diagnosed? This condition may be diagnosed based on:  Your symptoms and medical history.  A physical exam. During the exam, your health care provider may check your pelvic muscles for tightness, spasm, pain, or weakness. This may include a rectal exam and a pelvic exam for women. In some cases, you may have diagnostic tests, such as:  Electrical muscle function tests.  Urine flow testing.  X-ray tests of bowel function.  Ultrasound of the pelvic organs. How is this treated? Treatment for this condition depends on your symptoms. Treatment options include:  Physical therapy. This may include Kegel exercises to help relax or strengthen the pelvic floor muscles.  Biofeedback. This type of therapy provides feedback on how tight your pelvic floor muscles are so that you can learn to control them.  Internal or external massage therapy.  A treatment that involves electrical stimulation of the pelvic floor muscles to help control pain (transcutaneous electrical nerve stimulation, or TENS).  Sound wave therapy (ultrasound) to reduce muscle spasms.  Medicines, such as: ? Muscle relaxants. ? Bladder control medicines. Surgery to reconstruct or support pelvic floor muscles may be an option if other treatments do not help. Follow these instructions at home: Activity  Do your usual activities as told by your health care provider. Ask your health care provider if you should modify any activities.  Do pelvic floor strengthening or relaxing exercises at home as told by your physical therapist. Lifestyle  Maintain a healthy weight.  Eat foods that are high in fiber, such as   beans, whole grains, and fresh fruits and vegetables.  Limit foods that are high in fat and processed sugars, such as fried or sweet foods.  Manage stress with relaxation techniques such as yoga or  meditation. General instructions  If you have problems with leakage: ? Use absorbable pads or wear padded underwear. ? Wash frequently with mild soap. ? Keep your genital and anal area as clean and dry as possible. ? Ask your health care provider if you should try a barrier cream to prevent skin irritation.  Take warm baths to relieve pelvic muscle tension or spasms.  Take over-the-counter and prescription medicines only as told by your health care provider.  Keep all follow-up visits as told by your health care provider. This is important. Contact a health care provider if you:  Are not improving with home care.  Have signs or symptoms of PFD that get worse at home.  Develop new signs or symptoms at home.  Have signs of a urinary tract infection, such as: ? Fever. ? Chills. ? Urinary frequency. ? A burning feeling when urinating.  Have not had a bowel movement in 3 days (constipation). Summary  Pelvic floor dysfunction results when the muscles and connective tissues in your pelvic floor do not work well.  These muscles and their connections form a sling that supports your colon and bladder. In men, these muscles also support the prostate gland. In women, they also support the uterus.  PFD may be caused by an injury to the pelvic area or by a weakening of pelvic muscles.  PFD causes pelvic floor muscles to be too weak, too tight, or a combination of both. Symptoms may vary from person to person.  In most cases, PFD can be treated with physical therapies and medicines. Surgery may be an option if other treatments do not help. This information is not intended to replace advice given to you by your health care provider. Make sure you discuss any questions you have with your health care provider. Document Revised: 03/01/2018 Document Reviewed: 03/01/2018 Elsevier Patient Education  2020 [employer] Inc[employer]  

## 2020-06-12 LAB — MICROSCOPIC EXAMINATION

## 2020-06-12 LAB — URINALYSIS, COMPLETE
Bilirubin, UA: NEGATIVE
Glucose, UA: NEGATIVE
Ketones, UA: NEGATIVE
Leukocytes,UA: NEGATIVE
Nitrite, UA: NEGATIVE
Specific Gravity, UA: 1.025 (ref 1.005–1.030)
Urobilinogen, Ur: 0.2 mg/dL (ref 0.2–1.0)
pH, UA: 7 (ref 5.0–7.5)

## 2020-06-20 ENCOUNTER — Ambulatory Visit: Payer: BC Managed Care – PPO | Admitting: Dietician

## 2020-06-26 NOTE — Progress Notes (Signed)
06/27/2020 3:36 PM   Ariana Weeks 1970-11-07 [id number]  Referring provider: [name], MD [address] [place],  [place] [address]  Chief Complaint  Patient presents with  . Follow-up    HPI: Ariana Weeks is a 49 year old female with rUTI's, nephrolithiasis and hematuria who presents today to review renal ultrasound results.    rUTI's Date Organism Antibiotic  09/22/2019 E.coli - pan sensitive Keflex 500 mg, BID x 7days  12/15/2019 E.coli - pan senisitve Cipro 500 mg, BID x 5 days    KUB 12/2019 pelvic phleboliths present and punctate stones in the lower left pole  RUS 04/2020 6 mm stone in the right kidney, but no hydronephrosis is noted  She underwent cystoscopy with Dr. Junious Silk on 06/08/2020 is cystoscopy was NED.    She continues to experience frequent urination, leakage of urine and painful intercourse.  The pain is more localized into the left suprapubic region.  She describes it as colicky in nature.  Patient denies any modifying or aggravating factors.  Patient denies any gross hematuria, dysuria or suprapubic/flank pain.  Patient denies any fevers, chills, nausea or vomiting.   Nephrolithiasis RUS 05/24/2020 - no hydro                             - 6 mm right renal stone   PMH: Past Medical History:  Diagnosis Date  . BRCA negative 02/2019   MyRisk neg except AXIN2 VUS  . Depression   . Head cold   . History of frequent urinary tract infections   . History of kidney stones   . Hypertension   . Increased risk of breast cancer 02/2019   IBIS=66% due to hx of lobular CIS  . Kidney stones   . Left ureteral calculus   . 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 [name]  . Sleep apnea    no- c-pap    Surgical History: Past Surgical History:  Procedure Laterality Date  . BREAST BIOPSY Right 02/02/2013   [place] bx/clip-neg  . BREAST BIOPSY Right 02/19/2017   affirm, ATYPICAL LOBULAR HYPERPLASIA  . BREAST BIOPSY Right  03/04/2017   Procedure: BREAST BIOPSY WITH NEEDLE LOCALIZATION;  Surgeon: [name], MD;  Location: [facility] ORS;  Service: General;  Laterality: Right;  . BREAST LUMPECTOMY Right 2018  . CESAREAN SECTION  2005  . CHOLECYSTECTOMY  09/2012  . CYSTOSCOPY W/ RETROGRADES N/A 02/02/2017   Procedure: CYSTOSCOPY WITH RETROGRADE PYELOGRAM;  Surgeon: [name], MD;  Location: [facility] ORS;  Service: Urology;  Laterality: N/A;  . EXTRACORPOREAL SHOCK WAVE LITHOTRIPSY  2009  . INTRAUTERINE DEVICE INSERTION  MARCH 2012   MURINA  . LAPAROSCOPIC GASTRIC SLEEVE RESECTION N/A 08/09/2019   Procedure: LAPAROSCOPIC GASTRIC SLEEVE RESECTION, Upper Endo, ERAS [place];  Surgeon: [name], MD;  Location: [facility];  Service: General;  Laterality: N/A;  . [place]  (APPROX)   Kidney Stone  . URETEROSCOPY WITH HOLMIUM LASER LITHOTRIPSY Left 02/02/2017   Procedure: URETEROSCOPY WITH HOLMIUM LASER LITHOTRIPSY;  Surgeon: [name], MD;  Location: [facility] ORS;  Service: Urology;  Laterality: Left;    Home Medications:  Current Outpatient Medications on File Prior to Visit  Medication Sig Dispense Refill  . buPROPion (WELLBUTRIN XL) 300 MG 24 hr tablet Take 1 tablet (300 mg total) by mouth daily. 90 tablet 0  . hydrochlorothiazide (HYDRODIURIL)  25 MG tablet TAKE 1 TABLET(25 MG) BY MOUTH DAILY 90 tablet 3  . mirabegron ER (MYRBETRIQ) 25 MG TB24 tablet Take 1 tablet (25 mg total) by mouth daily. 28 tablet 0  . Multiple Vitamins-Minerals (BARIATRIC FUSION) CHEW Chew 1 each by mouth 2 (two) times daily.    Marland Kitchen PARoxetine (PAXIL) 10 MG tablet TAKE 1 TABLET(10 MG) BY MOUTH DAILY 90 tablet 1  . raloxifene (EVISTA) 60 MG tablet TAKE 1 TABLET(60 MG) BY MOUTH DAILY (Patient taking differently: Take 60 mg by mouth daily. ) 30 tablet 12  . traMADol (ULTRAM) 50 MG tablet Take 1 tablet (50 mg total) by mouth every 6 (six) hours as needed (pain). 10 tablet 0  . levonorgestrel (MIRENA) 20 MCG/24HR IUD 1 each  by Intrauterine route once.     No current facility-administered medications on file prior to visit.    Allergies:  Allergies  Allergen Reactions  . Dilaudid [Hydromorphone Hcl] Itching    Red face and itching  . Azithromycin     Other reaction(s): Unknown  . Nitrofurantoin Hives    Macrobid  . Morphine And Related Hives and Rash    Family History: Family History  Problem Relation Age of Onset  . Lung cancer Father   . Other Mother        breast cyst  . Breast cancer Mother 56       tested  . Alcohol abuse Maternal Uncle   . Alcohol abuse Paternal Uncle   . Ovarian cancer Maternal Grandmother 76  . Lymphoma Maternal Grandfather        cancer  . Breast cancer Maternal Aunt [address], late 42s  . Alcohol abuse Paternal Grandfather     Social History:  reports that she quit smoking about 6 years ago. Her smoking use included cigarettes. She has a 8.00 pack-year smoking history. She has never used smokeless tobacco. She reports current alcohol use. She reports that she does not use drugs.  ROS: Pertinent ROS in HPI  Physical Exam: BP 127/84 (BP Location: Left Arm, Patient Position: Sitting, Cuff Size: Normal)   Pulse 95   Ht 5' 2"  (1.575 m)   Wt 170 lb 4.8 oz (77.2 kg)   BMI 31.15 kg/m   Constitutional:  Well nourished. Alert and oriented, No acute distress. HEENT:  AT, mask in place.  Trachea midline Cardiovascular: No clubbing, cyanosis, or edema. Respiratory: Normal respiratory effort, no increased work of breathing. Neurologic: Grossly intact, no focal deficits, moving all 4 extremities. Psychiatric: Normal mood and affect.   Laboratory Data: Lab Results  Component Value Date   WBC 11.0 (H) 08/10/2019   HGB 12.4 08/10/2019   HCT 38.3 08/10/2019   MCV 93.4 08/10/2019   PLT 232 08/10/2019    Lab Results  Component Value Date   CREATININE 0.77 08/09/2019    Lab Results  Component Value Date   HGBA1C 5.3 06/09/2018    Lab Results    Component Value Date   TSH 2.25 08/03/2018       Component Value Date/Time   CHOL 195 05/28/2018 1125   HDL 40.40 05/28/2018 1125   CHOLHDL 5 05/28/2018 1125   VLDL 61.2 (H) 05/28/2018 1125    Lab Results  Component Value Date   AST 31 08/09/2019   Lab Results  Component Value Date   ALT 33 08/09/2019    Urinalysis Component     Latest Ref Rng & Units 06/08/2020  Specific  Gravity, UA     1.005 - 1.030 1.025  pH, UA     5.0 - 7.5 7.0  Color, UA     Yellow Yellow  Appearance Ur     Clear Hazy (A)  Leukocytes,UA     Negative Negative  Protein,UA     Negative/Trace Trace (A)  Glucose, UA     Negative Negative  Ketones, UA     Negative Negative  RBC, UA     Negative 2+ (A)  Bilirubin, UA     Negative Negative  Urobilinogen, Ur     0.2 - 1.0 mg/dL 0.2  Nitrite, UA     Negative Negative  Microscopic Examination      See below:   Component     Latest Ref Rng & Units 06/08/2020  WBC, UA     0 - 5 /hpf 0-5  RBC     0 - 2 /hpf 0-2  Epithelial Cells (non renal)     0 - 10 /hpf 0-10  Bacteria, UA     None seen/Few Moderate (A)  Yeast, UA     None seen Present (A)   I have reviewed the labs.   Pertinent Imaging: Results for ANAI, LIPSON (MRN [id number]) as of 06/07/2020 13:04  Ref. Range 06/06/2020 15:59  Scan Result Unknown 42     Assessment & Plan:    1. Urinary urgency/bladder pain Cystoscopy was NED We will schedule a CT renal stone study for further evaluation of her symptoms  2. Nephrolithiasis - Patient with bilateral nephrolithiasis on 2018 renal ultrasound - CT renal stone is pending  Return for CT Renal stone study .  These notes generated with voice recognition software. I apologize for typographical errors.  Zara Council, PA-C  [facility] [address]  [place], [place] [address] [phone]

## 2020-06-27 ENCOUNTER — Other Ambulatory Visit: Payer: Self-pay

## 2020-06-27 ENCOUNTER — Encounter: Payer: Self-pay | Admitting: Urology

## 2020-06-27 ENCOUNTER — Ambulatory Visit (INDEPENDENT_AMBULATORY_CARE_PROVIDER_SITE_OTHER): Payer: BC Managed Care – PPO | Admitting: Urology

## 2020-06-27 VITALS — BP 127/84 | HR 95 | Ht 62.0 in | Wt 170.3 lb

## 2020-06-27 DIAGNOSIS — R102 Pelvic and perineal pain: Secondary | ICD-10-CM | POA: Diagnosis not present

## 2020-06-27 MED ORDER — URIBEL 118 MG PO CAPS: 1.0000 | ORAL_CAPSULE | Freq: Four times a day (QID) | ORAL | 0 refills | Status: DC | PRN

## 2020-06-29 ENCOUNTER — Ambulatory Visit
Admission: RE | Admit: 2020-06-29 | Discharge: 2020-06-29 | Disposition: A | Discharge status: Home / Self Care | Payer: BC Managed Care – PPO | Source: Ambulatory Visit | Attending: Urology | Admitting: Urology

## 2020-06-29 ENCOUNTER — Other Ambulatory Visit: Payer: Self-pay

## 2020-06-29 DIAGNOSIS — R102 Pelvic and perineal pain: Secondary | ICD-10-CM | POA: Insufficient documentation

## 2020-07-02 ENCOUNTER — Telehealth: Payer: Self-pay | Admitting: Family Medicine

## 2020-07-02 NOTE — Telephone Encounter (Signed)
-----   Message from Harle Battiest, PA-C sent at 06/29/2020  4:07 PM EDT ----- Please let Ariana Weeks know that she has a stone.  Can she come in on Monday in [place] or Tuesday to discuss how to treat it?

## 2020-07-02 NOTE — Telephone Encounter (Signed)
LMOM for patient to return call.

## 2020-07-03 ENCOUNTER — Other Ambulatory Visit: Payer: Self-pay | Admitting: Radiology

## 2020-07-03 ENCOUNTER — Encounter: Payer: Self-pay | Admitting: Urology

## 2020-07-03 ENCOUNTER — Ambulatory Visit (INDEPENDENT_AMBULATORY_CARE_PROVIDER_SITE_OTHER): Payer: BC Managed Care – PPO | Admitting: Urology

## 2020-07-03 ENCOUNTER — Other Ambulatory Visit: Payer: Self-pay

## 2020-07-03 VITALS — BP 108/67 | HR 92 | Ht 62.0 in | Wt 165.0 lb

## 2020-07-03 DIAGNOSIS — N201 Calculus of ureter: Secondary | ICD-10-CM | POA: Diagnosis not present

## 2020-07-03 DIAGNOSIS — N200 Calculus of kidney: Secondary | ICD-10-CM | POA: Diagnosis not present

## 2020-07-03 LAB — MICROSCOPIC EXAMINATION

## 2020-07-03 LAB — URINALYSIS, COMPLETE
Bilirubin, UA: NEGATIVE
Glucose, UA: NEGATIVE
Ketones, UA: NEGATIVE
Leukocytes,UA: NEGATIVE
Nitrite, UA: NEGATIVE
Protein,UA: NEGATIVE
Specific Gravity, UA: 1.025 (ref 1.005–1.030)
Urobilinogen, Ur: 0.2 mg/dL (ref 0.2–1.0)
pH, UA: 6 (ref 5.0–7.5)

## 2020-07-05 ENCOUNTER — Other Ambulatory Visit: Payer: Self-pay

## 2020-07-05 ENCOUNTER — Encounter
Admission: RE | Admit: 2020-07-05 | Discharge: 2020-07-05 | Disposition: A | Discharge status: Home / Self Care | Payer: BC Managed Care – PPO | Source: Ambulatory Visit | Attending: Urology | Admitting: Urology

## 2020-07-05 HISTORY — DX: Anxiety disorder, unspecified: F41.9

## 2020-07-05 NOTE — Patient Instructions (Signed)
Your procedure is scheduled on: July 10, 2020 Tuesday  Report to the Registration Desk on the 1st floor of the [facility] To find out your arrival time, please call [phone] between 1PM - 3PM on: Monday 15, 2021  REMEMBER: Instructions that are not followed completely may result in serious medical risk, up to and including death; or upon the discretion of your surgeon and anesthesiologist your surgery may need to be rescheduled.  Do not eat food after midnight the night before surgery.  No gum chewing, lozengers or hard candies.  You may however, drink CLEAR liquids up to 2 hours before you are scheduled to arrive for your surgery. Do not drink anything within 2 hours of your scheduled arrival time.  Clear liquids include: - water  - apple juice without pulp - gatorade (not RED, PURPLE, OR BLUE) - black coffee or tea (Do NOT add milk or creamers to the coffee or tea) Do NOT drink anything that is not on this list.  Type 1 and Type 2 diabetics should only drink water.  TAKE THESE MEDICATIONS THE MORNING OF SURGERY WITH A SIP OF WATER: BUPROPION  One week prior to surgery: Stop Anti-inflammatories (NSAIDS) such as Advil, Aleve, Ibuprofen, Motrin, Naproxen, Naprosyn and ASPIRIN AND Aspirin based products such as Excedrin, Goodys Powder, BC Powder. Stop ANY OVER THE COUNTER supplements until after surgery. (However, you may continue taking Vitamin D, Vitamin B, and multivitamin up until the day before surgery.)  No Alcohol for 24 hours before or after surgery.  No Smoking including e-cigarettes for 24 hours prior to surgery.  No chewable tobacco products for at least 6 hours prior to surgery.  No nicotine patches on the day of surgery.  Do not use any "recreational" drugs for at least a week prior to your surgery.  Please be advised that the combination of cocaine and anesthesia may have negative outcomes, up to and including death. If you test positive for cocaine, your  surgery will be cancelled.  On the morning of surgery brush your teeth with toothpaste and water, you may rinse your mouth with mouthwash if you wish. Do not swallow any toothpaste or mouthwash.  Do not wear jewelry, make-up, hairpins, clips or nail polish.  Do not wear lotions, powders, or perfumes.   Do not shave body from the neck down 48 hours prior to surgery just in case you cut yourself which could leave a site for infection.  Also, freshly shaved skin may become irritated if using the CHG soap.  Contact lenses, hearing aids and dentures may not be worn into surgery.  Do not bring valuables to the hospital. [facility] is not responsible for any missing/lost belongings or valuables.   SHOWER MORNING OF SURGERY   Notify your doctor if there is any change in your medical condition (cold, fever, infection).  Wear comfortable clothing (specific to your surgery type) to the hospital.  Plan for stool softeners for home use; pain medications have a tendency to cause constipation. You can also help prevent constipation by eating foods high in fiber such as fruits and vegetables and drinking plenty of fluids as your diet allows.  After surgery, you can help prevent lung complications by doing breathing exercises.  Take deep breaths and cough every 1-2 hours. Your doctor may order a device called an Incentive Spirometer to help you take deep breaths. When coughing or sneezing, hold a pillow firmly against your incision with both hands. This is called "splinting." Doing  this helps protect your incision. It also decreases belly discomfort.  If you are being discharged the day of surgery, you will not be allowed to drive home. You will need a responsible adult (18 years or older) to drive you home and stay with you that night.   Please call the Pre-admissions Testing Dept. at [phone] if you have any questions about these instructions.  Visitation Policy:  Patients undergoing a  surgery or procedure may have one family member or support person with them as long as that person is not COVID-19 positive or experiencing its symptoms.  That person may remain in the waiting area during the procedure.  Inpatient Visitation Update:   In an effort to ensure the safety of our team members and our patients, we are implementing a change to our visitation policy:  Effective Monday, Aug. 9, at 7 a.m., inpatients will be allowed one support person.  o The support person may change daily.  o The support person must pass our screening, gel in and out, and wear a mask at all times, including in the patient's room.  o Patients must also wear a mask when staff or their support person are in the room.  o Masking is required regardless of vaccination status.  Systemwide, no visitors 17 or younger.

## 2020-07-06 ENCOUNTER — Telehealth: Payer: Self-pay | Admitting: Radiology

## 2020-07-06 ENCOUNTER — Other Ambulatory Visit
Admission: RE | Admit: 2020-07-06 | Discharge: 2020-07-06 | Disposition: A | Discharge status: Home / Self Care | Payer: BC Managed Care – PPO | Source: Ambulatory Visit | Attending: Urology | Admitting: Urology

## 2020-07-06 DIAGNOSIS — Z01812 Encounter for preprocedural laboratory examination: Secondary | ICD-10-CM | POA: Insufficient documentation

## 2020-07-06 DIAGNOSIS — Z20822 Contact with and (suspected) exposure to covid-19: Secondary | ICD-10-CM | POA: Insufficient documentation

## 2020-07-06 DIAGNOSIS — E876 Hypokalemia: Secondary | ICD-10-CM

## 2020-07-06 LAB — CULTURE, URINE COMPREHENSIVE

## 2020-07-06 LAB — POTASSIUM: Potassium: 3.1 mmol/L — ABNORMAL LOW (ref 3.5–5.1)

## 2020-07-06 MED ORDER — POTASSIUM CHLORIDE ER 10 MEQ PO TBCR: 10.0000 meq | EXTENDED_RELEASE_TABLET | Freq: Every day | ORAL | 0 refills | Status: DC

## 2020-07-06 NOTE — Telephone Encounter (Signed)
Notified patient of script for potassium chloride 10 MEQ daily prior to surgery sent to pharmacy for potassium level of 3.1 per Dr Lonna Cobb.

## 2020-07-06 NOTE — Progress Notes (Signed)
  [facility] Perioperative Services: Pre-Admission/Anesthesia Testing  Abnormal Lab Notification   Date: 07/06/20  Name: Ariana Weeks MRN:   [id number]  Re: Abnormal labs noted during PAT appointment   Provider(s) Notified: Riki Altes, MD Notification mode: Routed and/or faxed via [facility]   ABNORMAL LAB VALUE(S): Lab Results  Component Value Date   K 3.1 (L) 07/06/2020   Notes:  Patient with noted HYPOkalemia on PAT labs. She is on daily thiazide diuretic therapy (HCTZ 25 mg) for her HTN. Patient is scheduled for a CYSTOSCOPY/URETEROSCOPY/HOLMIUM LASER/STENT PLACEMENT (Left ) on 07/10/2020. Will send to surgeon for review and optimization. Order placed for K+ level to be rechecked on the day of surgery. This is a Personal assistant; no formal response is required.  Quentin Mulling, MSN, APRN, FNP-C, CEN [facility]  Peri-operative Services Nurse Practitioner Phone: [phone] 07/06/20 2:36 PM

## 2020-07-07 ENCOUNTER — Other Ambulatory Visit: Payer: Self-pay | Admitting: Urology

## 2020-07-07 DIAGNOSIS — E876 Hypokalemia: Secondary | ICD-10-CM

## 2020-07-07 LAB — SARS CORONAVIRUS 2 (TAT 6-24 HRS): SARS Coronavirus 2: NEGATIVE

## 2020-07-08 ENCOUNTER — Other Ambulatory Visit: Payer: Self-pay | Admitting: Urology

## 2020-07-08 DIAGNOSIS — E876 Hypokalemia: Secondary | ICD-10-CM

## 2020-07-08 NOTE — H&P (View-Only) (Signed)
07/03/2020 6:11 PM   Ariana Weeks Jun 23, 1971 [id number]  Referring provider: [name], MD [address],  [place] [address]  Chief Complaint  Patient presents with  . Nephrolithiasis    HPI: [name] is a 49 year old female with rUTI's, nephrolithiasis and hematuria who presents today to review CT renal stone results.    CT renal stone study 06/29/2020 with distal 7 mm stone and very small bilateral stones.  Mild left hydronephrosis noted.  She continues to experience the lower abdominal pain which is now concentrated in the left lower quadrant.  This discomfort has been present on and off for the last 6 months.  She wants to move forward with definitive stone treatment at this time.  Patient denies any modifying or aggravating factors.  Patient denies any gross hematuria, dysuria or suprapubic/flank pain.  Patient denies any fevers, chills, nausea or vomiting.   UA moderate bacteria.   PMH: Past Medical History:  Diagnosis Date  . Anxiety   . BRCA negative 02/2019   MyRisk neg except AXIN2 VUS  . Depression   . Head cold   . History of frequent urinary tract infections   . History of kidney stones   . Hypertension    before gastric bypass  . Increased risk of breast cancer 02/2019   IBIS=66% due to hx of lobular CIS  . Kidney stones   . Left ureteral calculus   . 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 [name]  . Sleep apnea    no- c-pap    Surgical History: Past Surgical History:  Procedure Laterality Date  . BREAST BIOPSY Right 02/02/2013   [place] bx/clip-neg  . BREAST BIOPSY Right 02/19/2017   affirm, ATYPICAL LOBULAR HYPERPLASIA  . BREAST BIOPSY Right 03/04/2017   Procedure: BREAST BIOPSY WITH NEEDLE LOCALIZATION;  Surgeon: [name], MD;  Location: [facility];  Service: General;  Laterality: Right;  . BREAST LUMPECTOMY Right 2018  . CESAREAN SECTION  2005 2015  . CHOLECYSTECTOMY  09/2012  . CYSTOSCOPY W/  RETROGRADES N/A 02/02/2017   Procedure: CYSTOSCOPY WITH RETROGRADE PYELOGRAM;  Surgeon: [name], MD;  Location: [facility];  Service: Urology;  Laterality: N/A;  . EXTRACORPOREAL SHOCK WAVE LITHOTRIPSY  2009  . INTRAUTERINE DEVICE INSERTION  MARCH 2012   MURINA  . LAPAROSCOPIC GASTRIC SLEEVE RESECTION N/A 08/09/2019   Procedure: LAPAROSCOPIC GASTRIC SLEEVE RESECTION, Upper Endo, [facility] [place];  Surgeon: [name], MD;  Location: [facility];  Service: General;  Laterality: N/A;  . [place]  (APPROX)   Kidney Stone  . URETEROSCOPY WITH HOLMIUM LASER LITHOTRIPSY Left 02/02/2017   Procedure: URETEROSCOPY WITH HOLMIUM LASER LITHOTRIPSY;  Surgeon: [name], MD;  Location: [facility];  Service: Urology;  Laterality: Left;    Home Medications:  Current Outpatient Medications on File Prior to Visit  Medication Sig Dispense Refill  . buPROPion (WELLBUTRIN XL) 300 MG 24 hr tablet Take 1 tablet (300 mg total) by mouth daily. 90 tablet 0  . hydrochlorothiazide (HYDRODIURIL) 25 MG tablet TAKE 1 TABLET(25 MG) BY MOUTH DAILY 90 tablet 3  . Meth-Hyo-M Bl-Na Phos-Ph Sal (URIBEL) 118 MG CAPS Take 1 capsule (118 mg total) by mouth every 6 (six) hours as needed. (Patient not taking: Reported on 07/05/2020) 8 capsule 0  . mirabegron ER (MYRBETRIQ) 25 MG TB24 tablet Take 1 tablet (25 mg total) by mouth daily. (Patient not taking: Reported  on 07/05/2020) 28 tablet 0  . Multiple Vitamins-Minerals (BARIATRIC FUSION) CHEW Chew 1 each by mouth 2 (two) times daily.    Marland Kitchen PARoxetine (PAXIL) 10 MG tablet TAKE 1 TABLET(10 MG) BY MOUTH DAILY 90 tablet 1  . raloxifene (EVISTA) 60 MG tablet TAKE 1 TABLET(60 MG) BY MOUTH DAILY (Patient taking differently: Take 60 mg by mouth daily. ) 30 tablet 12  . traMADol (ULTRAM) 50 MG tablet Take 1 tablet (50 mg total) by mouth every 6 (six) hours as needed (pain). 10 tablet 0  . levonorgestrel (MIRENA) 20 MCG/24HR IUD 1 each by Intrauterine route once.     No  current facility-administered medications on file prior to visit.    Allergies:  Allergies  Allergen Reactions  . Dilaudid [Hydromorphone Hcl] Itching    Red face and itching  . Azithromycin     Other reaction(s): Unknown  . Nitrofurantoin Hives    Macrobid  . Morphine And Related Hives and Rash    Family History: Family History  Problem Relation Age of Onset  . Lung cancer Father   . Other Mother        breast cyst  . Breast cancer Mother 47       tested  . Alcohol abuse Maternal Uncle   . Alcohol abuse Paternal Uncle   . Ovarian cancer Maternal Grandmother 76  . Lymphoma Maternal Grandfather        cancer  . Breast cancer Maternal Aunt [address] late 44s  . Alcohol abuse Paternal Grandfather     Social History:  reports that she quit smoking about 6 years ago. Her smoking use included cigarettes. She has a 8.00 pack-year smoking history. She has never used smokeless tobacco. She reports previous alcohol use. She reports that she does not use drugs.  ROS: Pertinent ROS in HPI  Physical Exam: BP 108/67   Pulse 92   Ht 5' 2" (1.575 m)   Wt 165 lb (74.8 kg)   BMI 30.18 kg/m   Constitutional:  Well nourished. Alert and oriented, No acute distress. HEENT: [place] AT, mask in place.  Trachea midline Cardiovascular: No clubbing, cyanosis, or edema. Respiratory: Normal respiratory effort, no increased work of breathing. Neurologic: Grossly intact, no focal deficits, moving all 4 extremities. Psychiatric: Normal mood and affect.    Laboratory Data: Lab Results  Component Value Date   WBC 11.0 (H) 08/10/2019   HGB 12.4 08/10/2019   HCT 38.3 08/10/2019   MCV 93.4 08/10/2019   PLT 232 08/10/2019    Lab Results  Component Value Date   CREATININE 0.77 08/09/2019    Lab Results  Component Value Date   HGBA1C 5.3 06/09/2018    Lab Results  Component Value Date   TSH 2.25 08/03/2018       Component Value Date/Time   CHOL 195 05/28/2018 1125   HDL  40.40 05/28/2018 1125   CHOLHDL 5 05/28/2018 1125   VLDL 61.2 (H) 05/28/2018 1125    Lab Results  Component Value Date   AST 31 08/09/2019   Lab Results  Component Value Date   ALT 33 08/09/2019    Urinalysis Component     Latest Ref Rng & Units 07/03/2020  Specific Gravity, UA     1.005 - 1.030 1.025  pH, UA     5.0 - 7.5 6.0  Color, UA     Yellow Yellow  Appearance Ur     Clear Hazy (A)  Leukocytes,UA     Negative Negative  Protein,UA     Negative/Trace Negative  Glucose, UA     Negative Negative  Ketones, UA     Negative Negative  RBC, UA     Negative Trace (A)  Bilirubin, UA     Negative Negative  Urobilinogen, Ur     0.2 - 1.0 mg/dL 0.2  Nitrite, UA     Negative Negative  Microscopic Examination      See below:   Component     Latest Ref Rng & Units 07/03/2020  WBC, UA     0 - 5 /hpf 0-5  RBC     0 - 2 /hpf 0-2  Epithelial Cells (non renal)     0 - 10 /hpf 0-10  Bacteria, UA     None seen/Few Moderate (A)   I have reviewed the labs.   Pertinent Imaging: Narrative & Impression  CLINICAL DATA:  Flank pain and pelvic pain for several months. No reported hematuria. History of nephrolithiasis.  EXAM: CT ABDOMEN AND PELVIS WITHOUT CONTRAST  TECHNIQUE: Multidetector CT imaging of the abdomen and pelvis was performed following the standard protocol without IV contrast.  COMPARISON:  05/24/2020 renal sonogram. 02/02/2017 CT abdomen/pelvis.  FINDINGS: Lower chest: No significant pulmonary nodules or acute consolidative airspace disease.  Hepatobiliary: Normal liver size. No liver mass. Cholecystectomy. No biliary ductal dilatation.  Pancreas: Normal, with no mass or duct dilation.  Spleen: Normal size. No mass.  Adrenals/Urinary Tract: Normal adrenals. Left pelvic ureteral 7 mm stone located approximately 3 cm above the left ureterovesical junction, with minimal left hydroureteronephrosis. No additional ureteral stones. No overt  right hydronephrosis. Three punctate scattered nonobstructing 1 mm stones throughout the right renal collecting system. At least 7 scattered nonobstructing left renal stones, largest 3 mm in the lower left kidney. No contour deforming renal masses. Normal nondistended bladder.  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 Stomach is nondistended and otherwise normal. Normal caliber small bowel with no small bowel wall thickening. Normal appendix. Normal large bowel with no diverticulosis, large bowel wall thickening or pericolonic fat stranding.  Vascular/Lymphatic: Minimally atherosclerotic nonaneurysmal abdominal aorta. No pathologically enlarged lymph nodes in the abdomen or pelvis.  Reproductive: Intrauterine device noted in the uterine cavity (noncontrast CT cannot accurately assess for appropriate IUD positioning). No adnexal masses.  Other: No pneumoperitoneum, ascites or focal fluid collection.  Musculoskeletal: No aggressive appearing focal osseous lesions. Mild thoracolumbar spondylosis.  IMPRESSION: 1. Left pelvic ureteral 7 mm stone located approximately 3 cm above the left UVJ, with minimal left hydroureteronephrosis. 2. Numerous additional nonobstructing stones in both kidneys. 3. Aortic Atherosclerosis (ICD10-I70.0).   Electronically Signed   By: Ilona Sorrel M.D.   On: 06/29/2020 15:49   I have independently reviewed the films.  See HPI.    Assessment & Plan:    1. Left ureteral stone 7 mm stone distal stone seen on recent CT renal stone imaging Patient is not wanting to pursue MET therapy at this time, I explained that URS would be the first lined therapy if stone(s) do not pass, but ESWL is the procedure with the least morbidity and lowest complication rate, but URS has a greater stone-free rate in a single procedure She would like to undergo URS/LL/ureteral stent placement as she has had this in the past Explained to  the patient how the URS/LL/ureteral stent placement is performed and the risks involved- informed patient that they will have a stent placed during the procedure  and will remain in place after the procedure for a short time- stent may be removed in the office with a cystoscope or patient may be instructed to remove the stent themselves by the string - described "stent pain" as feelings of needing to urinate/overactive bladder and a warm, tingling sensation to intense pain in the affected flank- residual stones within the kidney or ureter may be present after the procedure and may need to have these addressed at a different encounter- injury to the ureter is the most common intra-operative risk, it may result in an open procedure to correct the defect - infection and bleeding are also risks- explained the risks of general anesthesia, such as: MI, CVA, paralysis, coma and/or death - advised to contact our office or seek treatment in the ED if becomes febrile or pain/ vomiting are difficult control in order to arrange for emergent/urgent intervention Patient in agreement and would like to proceed with left ureteroscopy with laser lithotripsy and ureteral stent placement Urine sent for culture   Return for left URS/LL/ureteral stent placement .  These notes generated with voice recognition software. I apologize for typographical errors.  Zara Council, PA-C  [facility] [address]  [place], [place] [address] [phone]

## 2020-07-08 NOTE — Progress Notes (Signed)
07/03/2020 6:11 PM   Ariana Weeks Jun 23, 1971 [id number]  Referring provider: [name], MD [address],  [place] [address]  Chief Complaint  Patient presents with  . Nephrolithiasis    HPI: [name] is a 49 year old female with rUTI's, nephrolithiasis and hematuria who presents today to review CT renal stone results.    CT renal stone study 06/29/2020 with distal 7 mm stone and very small bilateral stones.  Mild left hydronephrosis noted.  She continues to experience the lower abdominal pain which is now concentrated in the left lower quadrant.  This discomfort has been present on and off for the last 6 months.  She wants to move forward with definitive stone treatment at this time.  Patient denies any modifying or aggravating factors.  Patient denies any gross hematuria, dysuria or suprapubic/flank pain.  Patient denies any fevers, chills, nausea or vomiting.   UA moderate bacteria.   PMH: Past Medical History:  Diagnosis Date  . Anxiety   . BRCA negative 02/2019   MyRisk neg except AXIN2 VUS  . Depression   . Head cold   . History of frequent urinary tract infections   . History of kidney stones   . Hypertension    before gastric bypass  . Increased risk of breast cancer 02/2019   IBIS=66% due to hx of lobular CIS  . Kidney stones   . Left ureteral calculus   . 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 [name]  . Sleep apnea    no- c-pap    Surgical History: Past Surgical History:  Procedure Laterality Date  . BREAST BIOPSY Right 02/02/2013   [place] bx/clip-neg  . BREAST BIOPSY Right 02/19/2017   affirm, ATYPICAL LOBULAR HYPERPLASIA  . BREAST BIOPSY Right 03/04/2017   Procedure: BREAST BIOPSY WITH NEEDLE LOCALIZATION;  Surgeon: [name], MD;  Location: [facility];  Service: General;  Laterality: Right;  . BREAST LUMPECTOMY Right 2018  . CESAREAN SECTION  2005 2015  . CHOLECYSTECTOMY  09/2012  . CYSTOSCOPY W/  RETROGRADES N/A 02/02/2017   Procedure: CYSTOSCOPY WITH RETROGRADE PYELOGRAM;  Surgeon: [name], MD;  Location: [facility];  Service: Urology;  Laterality: N/A;  . EXTRACORPOREAL SHOCK WAVE LITHOTRIPSY  2009  . INTRAUTERINE DEVICE INSERTION  MARCH 2012   MURINA  . LAPAROSCOPIC GASTRIC SLEEVE RESECTION N/A 08/09/2019   Procedure: LAPAROSCOPIC GASTRIC SLEEVE RESECTION, Upper Endo, [facility] [place];  Surgeon: [name], MD;  Location: [facility];  Service: General;  Laterality: N/A;  . [place]  (APPROX)   Kidney Stone  . URETEROSCOPY WITH HOLMIUM LASER LITHOTRIPSY Left 02/02/2017   Procedure: URETEROSCOPY WITH HOLMIUM LASER LITHOTRIPSY;  Surgeon: [name], MD;  Location: [facility];  Service: Urology;  Laterality: Left;    Home Medications:  Current Outpatient Medications on File Prior to Visit  Medication Sig Dispense Refill  . buPROPion (WELLBUTRIN XL) 300 MG 24 hr tablet Take 1 tablet (300 mg total) by mouth daily. 90 tablet 0  . hydrochlorothiazide (HYDRODIURIL) 25 MG tablet TAKE 1 TABLET(25 MG) BY MOUTH DAILY 90 tablet 3  . Meth-Hyo-M Bl-Na Phos-Ph Sal (URIBEL) 118 MG CAPS Take 1 capsule (118 mg total) by mouth every 6 (six) hours as needed. (Patient not taking: Reported on 07/05/2020) 8 capsule 0  . mirabegron ER (MYRBETRIQ) 25 MG TB24 tablet Take 1 tablet (25 mg total) by mouth daily. (Patient not taking: Reported  on 07/05/2020) 28 tablet 0  . Multiple Vitamins-Minerals (BARIATRIC FUSION) CHEW Chew 1 each by mouth 2 (two) times daily.    Marland Kitchen PARoxetine (PAXIL) 10 MG tablet TAKE 1 TABLET(10 MG) BY MOUTH DAILY 90 tablet 1  . raloxifene (EVISTA) 60 MG tablet TAKE 1 TABLET(60 MG) BY MOUTH DAILY (Patient taking differently: Take 60 mg by mouth daily. ) 30 tablet 12  . traMADol (ULTRAM) 50 MG tablet Take 1 tablet (50 mg total) by mouth every 6 (six) hours as needed (pain). 10 tablet 0  . levonorgestrel (MIRENA) 20 MCG/24HR IUD 1 each by Intrauterine route once.     No  current facility-administered medications on file prior to visit.    Allergies:  Allergies  Allergen Reactions  . Dilaudid [Hydromorphone Hcl] Itching    Red face and itching  . Azithromycin     Other reaction(s): Unknown  . Nitrofurantoin Hives    Macrobid  . Morphine And Related Hives and Rash    Family History: Family History  Problem Relation Age of Onset  . Lung cancer Father   . Other Mother        breast cyst  . Breast cancer Mother 47       tested  . Alcohol abuse Maternal Uncle   . Alcohol abuse Paternal Uncle   . Ovarian cancer Maternal Grandmother 76  . Lymphoma Maternal Grandfather        cancer  . Breast cancer Maternal Aunt [address] late 44s  . Alcohol abuse Paternal Grandfather     Social History:  reports that she quit smoking about 6 years ago. Her smoking use included cigarettes. She has a 8.00 pack-year smoking history. She has never used smokeless tobacco. She reports previous alcohol use. She reports that she does not use drugs.  ROS: Pertinent ROS in HPI  Physical Exam: BP 108/67   Pulse 92   Ht 5' 2" (1.575 m)   Wt 165 lb (74.8 kg)   BMI 30.18 kg/m   Constitutional:  Well nourished. Alert and oriented, No acute distress. HEENT: [place] AT, mask in place.  Trachea midline Cardiovascular: No clubbing, cyanosis, or edema. Respiratory: Normal respiratory effort, no increased work of breathing. Neurologic: Grossly intact, no focal deficits, moving all 4 extremities. Psychiatric: Normal mood and affect.    Laboratory Data: Lab Results  Component Value Date   WBC 11.0 (H) 08/10/2019   HGB 12.4 08/10/2019   HCT 38.3 08/10/2019   MCV 93.4 08/10/2019   PLT 232 08/10/2019    Lab Results  Component Value Date   CREATININE 0.77 08/09/2019    Lab Results  Component Value Date   HGBA1C 5.3 06/09/2018    Lab Results  Component Value Date   TSH 2.25 08/03/2018       Component Value Date/Time   CHOL 195 05/28/2018 1125   HDL  40.40 05/28/2018 1125   CHOLHDL 5 05/28/2018 1125   VLDL 61.2 (H) 05/28/2018 1125    Lab Results  Component Value Date   AST 31 08/09/2019   Lab Results  Component Value Date   ALT 33 08/09/2019    Urinalysis Component     Latest Ref Rng & Units 07/03/2020  Specific Gravity, UA     1.005 - 1.030 1.025  pH, UA     5.0 - 7.5 6.0  Color, UA     Yellow Yellow  Appearance Ur     Clear Hazy (A)  Leukocytes,UA     Negative Negative  Protein,UA     Negative/Trace Negative  Glucose, UA     Negative Negative  Ketones, UA     Negative Negative  RBC, UA     Negative Trace (A)  Bilirubin, UA     Negative Negative  Urobilinogen, Ur     0.2 - 1.0 mg/dL 0.2  Nitrite, UA     Negative Negative  Microscopic Examination      See below:   Component     Latest Ref Rng & Units 07/03/2020  WBC, UA     0 - 5 /hpf 0-5  RBC     0 - 2 /hpf 0-2  Epithelial Cells (non renal)     0 - 10 /hpf 0-10  Bacteria, UA     None seen/Few Moderate (A)   I have reviewed the labs.   Pertinent Imaging: Narrative & Impression  CLINICAL DATA:  Flank pain and pelvic pain for several months. No reported hematuria. History of nephrolithiasis.  EXAM: CT ABDOMEN AND PELVIS WITHOUT CONTRAST  TECHNIQUE: Multidetector CT imaging of the abdomen and pelvis was performed following the standard protocol without IV contrast.  COMPARISON:  05/24/2020 renal sonogram. 02/02/2017 CT abdomen/pelvis.  FINDINGS: Lower chest: No significant pulmonary nodules or acute consolidative airspace disease.  Hepatobiliary: Normal liver size. No liver mass. Cholecystectomy. No biliary ductal dilatation.  Pancreas: Normal, with no mass or duct dilation.  Spleen: Normal size. No mass.  Adrenals/Urinary Tract: Normal adrenals. Left pelvic ureteral 7 mm stone located approximately 3 cm above the left ureterovesical junction, with minimal left hydroureteronephrosis. No additional ureteral stones. No overt  right hydronephrosis. Three punctate scattered nonobstructing 1 mm stones throughout the right renal collecting system. At least 7 scattered nonobstructing left renal stones, largest 3 mm in the lower left kidney. No contour deforming renal masses. Normal nondistended bladder.  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 Stomach is nondistended and otherwise normal. Normal caliber small bowel with no small bowel wall thickening. Normal appendix. Normal large bowel with no diverticulosis, large bowel wall thickening or pericolonic fat stranding.  Vascular/Lymphatic: Minimally atherosclerotic nonaneurysmal abdominal aorta. No pathologically enlarged lymph nodes in the abdomen or pelvis.  Reproductive: Intrauterine device noted in the uterine cavity (noncontrast CT cannot accurately assess for appropriate IUD positioning). No adnexal masses.  Other: No pneumoperitoneum, ascites or focal fluid collection.  Musculoskeletal: No aggressive appearing focal osseous lesions. Mild thoracolumbar spondylosis.  IMPRESSION: 1. Left pelvic ureteral 7 mm stone located approximately 3 cm above the left UVJ, with minimal left hydroureteronephrosis. 2. Numerous additional nonobstructing stones in both kidneys. 3. Aortic Atherosclerosis (ICD10-I70.0).   Electronically Signed   By: Jason A Poff M.D.   On: 06/29/2020 15:49   I have independently reviewed the films.  See HPI.    Assessment & Plan:    1. Left ureteral stone 7 mm stone distal stone seen on recent CT renal stone imaging Patient is not wanting to pursue MET therapy at this time, I explained that URS would be the first lined therapy if stone(s) do not pass, but ESWL is the procedure with the least morbidity and lowest complication rate, but URS has a greater stone-free rate in a single procedure She would like to undergo URS/LL/ureteral stent placement as she has had this in the past Explained to  the patient how the URS/LL/ureteral stent placement is performed and the risks involved- informed patient that they will have a stent placed during the procedure   and will remain in place after the procedure for a short time- stent may be removed in the office with a cystoscope or patient may be instructed to remove the stent themselves by the string - described "stent pain" as feelings of needing to urinate/overactive bladder and a warm, tingling sensation to intense pain in the affected flank- residual stones within the kidney or ureter may be present after the procedure and may need to have these addressed at a different encounter- injury to the ureter is the most common intra-operative risk, it may result in an open procedure to correct the defect - infection and bleeding are also risks- explained the risks of general anesthesia, such as: MI, CVA, paralysis, coma and/or death - advised to contact our office or seek treatment in the ED if becomes febrile or pain/ vomiting are difficult control in order to arrange for emergent/urgent intervention Patient in agreement and would like to proceed with left ureteroscopy with laser lithotripsy and ureteral stent placement Urine sent for culture   Return for left URS/LL/ureteral stent placement .  These notes generated with voice recognition software. I apologize for typographical errors.  Jonthan Leite, PA-C  [facility] [address]  [address] [place], [place] [address] [phone]  

## 2020-07-09 ENCOUNTER — Other Ambulatory Visit: Payer: Self-pay | Admitting: Urology

## 2020-07-09 DIAGNOSIS — E876 Hypokalemia: Secondary | ICD-10-CM

## 2020-07-09 MED ORDER — ORAL CARE MOUTH RINSE: 15.0000 mL | Freq: Once | OROMUCOSAL | Status: AC

## 2020-07-09 MED ORDER — LACTATED RINGERS IV SOLN: INTRAVENOUS | Status: DC

## 2020-07-09 MED ORDER — CHLORHEXIDINE GLUCONATE 0.12 % MT SOLN: 15.0000 mL | Freq: Once | OROMUCOSAL | Status: AC

## 2020-07-09 MED ORDER — CEFAZOLIN SODIUM-DEXTROSE 2-4 GM/100ML-% IV SOLN
2.0000 g | INTRAVENOUS | Status: AC
  Administered 2020-07-10: 2 g via INTRAVENOUS

## 2020-07-09 MED ORDER — FAMOTIDINE 20 MG PO TABS: 20.0000 mg | ORAL_TABLET | Freq: Once | ORAL | Status: AC

## 2020-07-10 ENCOUNTER — Ambulatory Visit
Admission: RE | Admit: 2020-07-10 | Discharge: 2020-07-10 | Disposition: A | Discharge status: Home / Self Care | Payer: BC Managed Care – PPO | Attending: Urology | Admitting: Urology

## 2020-07-10 ENCOUNTER — Encounter: Payer: Self-pay | Admitting: Urology

## 2020-07-10 ENCOUNTER — Ambulatory Visit: Payer: BC Managed Care – PPO

## 2020-07-10 ENCOUNTER — Ambulatory Visit: Payer: BC Managed Care – PPO | Admitting: Urgent Care

## 2020-07-10 ENCOUNTER — Other Ambulatory Visit: Payer: Self-pay

## 2020-07-10 ENCOUNTER — Encounter
Admission: RE | Disposition: A | Discharge status: Home / Self Care | Payer: Self-pay | Source: Home / Self Care | Attending: Urology

## 2020-07-10 DIAGNOSIS — Z87891 Personal history of nicotine dependence: Secondary | ICD-10-CM | POA: Insufficient documentation

## 2020-07-10 DIAGNOSIS — Z9884 Bariatric surgery status: Secondary | ICD-10-CM | POA: Insufficient documentation

## 2020-07-10 DIAGNOSIS — Z9049 Acquired absence of other specified parts of digestive tract: Secondary | ICD-10-CM | POA: Insufficient documentation

## 2020-07-10 DIAGNOSIS — Z885 Allergy status to narcotic agent status: Secondary | ICD-10-CM | POA: Diagnosis not present

## 2020-07-10 DIAGNOSIS — N202 Calculus of kidney with calculus of ureter: Secondary | ICD-10-CM | POA: Diagnosis not present

## 2020-07-10 DIAGNOSIS — Z87442 Personal history of urinary calculi: Secondary | ICD-10-CM | POA: Insufficient documentation

## 2020-07-10 DIAGNOSIS — N201 Calculus of ureter: Secondary | ICD-10-CM

## 2020-07-10 DIAGNOSIS — N200 Calculus of kidney: Secondary | ICD-10-CM

## 2020-07-10 DIAGNOSIS — Z79899 Other long term (current) drug therapy: Secondary | ICD-10-CM | POA: Diagnosis not present

## 2020-07-10 DIAGNOSIS — Z793 Long term (current) use of hormonal contraceptives: Secondary | ICD-10-CM | POA: Diagnosis not present

## 2020-07-10 DIAGNOSIS — Z87440 Personal history of urinary (tract) infections: Secondary | ICD-10-CM | POA: Diagnosis not present

## 2020-07-10 DIAGNOSIS — Z881 Allergy status to other antibiotic agents status: Secondary | ICD-10-CM | POA: Insufficient documentation

## 2020-07-10 HISTORY — PX: CYSTOSCOPY/URETEROSCOPY/HOLMIUM LASER/STENT PLACEMENT: SHX6546

## 2020-07-10 LAB — POTASSIUM: Potassium: 3.8 mmol/L (ref 3.5–5.1)

## 2020-07-10 LAB — POCT PREGNANCY, URINE: Preg Test, Ur: NEGATIVE

## 2020-07-10 SURGERY — CYSTOSCOPY/URETEROSCOPY/HOLMIUM LASER/STENT PLACEMENT
Anesthesia: General | Laterality: Left

## 2020-07-10 MED ORDER — IOPAMIDOL (ISOVUE-200) INJECTION 41%
INTRAVENOUS | Status: DC | PRN
  Administered 2020-07-10: 5 mL via INTRAVENOUS

## 2020-07-10 MED ORDER — FENTANYL CITRATE (PF) 100 MCG/2ML IJ SOLN
INTRAMUSCULAR | Status: DC | PRN
  Administered 2020-07-10 (×2): 50 ug via INTRAVENOUS

## 2020-07-10 MED ORDER — CEFAZOLIN SODIUM-DEXTROSE 2-4 GM/100ML-% IV SOLN
INTRAVENOUS | Status: AC
  Filled 2020-07-10: qty 100

## 2020-07-10 MED ORDER — OXYCODONE HCL 5 MG/5ML PO SOLN: 5.0000 mg | Freq: Once | ORAL | Status: DC | PRN

## 2020-07-10 MED ORDER — MEPERIDINE HCL 50 MG/ML IJ SOLN: 6.2500 mg | INTRAMUSCULAR | Status: DC | PRN

## 2020-07-10 MED ORDER — OXYBUTYNIN CHLORIDE 5 MG PO TABS
5.0000 mg | ORAL_TABLET | Freq: Three times a day (TID) | ORAL | Status: DC | PRN
  Administered 2020-07-10: 5 mg via ORAL

## 2020-07-10 MED ORDER — OXYBUTYNIN CHLORIDE 5 MG PO TABS: ORAL_TABLET | ORAL | 0 refills | Status: DC

## 2020-07-10 MED ORDER — FENTANYL CITRATE (PF) 100 MCG/2ML IJ SOLN: 25.0000 ug | INTRAMUSCULAR | Status: DC | PRN

## 2020-07-10 MED ORDER — EPHEDRINE 5 MG/ML INJ
INTRAVENOUS | Status: AC
  Filled 2020-07-10: qty 10

## 2020-07-10 MED ORDER — CHLORHEXIDINE GLUCONATE 0.12 % MT SOLN
OROMUCOSAL | Status: AC
  Administered 2020-07-10: 15 mL via OROMUCOSAL
  Filled 2020-07-10: qty 15

## 2020-07-10 MED ORDER — TAMSULOSIN HCL 0.4 MG PO CAPS: 0.4000 mg | ORAL_CAPSULE | Freq: Every day | ORAL | 0 refills | Status: DC

## 2020-07-10 MED ORDER — DEXAMETHASONE SODIUM PHOSPHATE 10 MG/ML IJ SOLN
INTRAMUSCULAR | Status: AC
  Filled 2020-07-10: qty 1

## 2020-07-10 MED ORDER — ACETAMINOPHEN 10 MG/ML IV SOLN
INTRAVENOUS | Status: AC
  Filled 2020-07-10: qty 100

## 2020-07-10 MED ORDER — LIDOCAINE HCL (CARDIAC) PF 100 MG/5ML IV SOSY
PREFILLED_SYRINGE | INTRAVENOUS | Status: DC | PRN
  Administered 2020-07-10: 50 mg via INTRAVENOUS

## 2020-07-10 MED ORDER — FAMOTIDINE 20 MG PO TABS
ORAL_TABLET | ORAL | Status: AC
  Administered 2020-07-10: 20 mg via ORAL
  Filled 2020-07-10: qty 1

## 2020-07-10 MED ORDER — ONDANSETRON HCL 4 MG/2ML IJ SOLN
INTRAMUSCULAR | Status: DC | PRN
  Administered 2020-07-10: 4 mg via INTRAVENOUS

## 2020-07-10 MED ORDER — OXYBUTYNIN CHLORIDE 5 MG PO TABS
ORAL_TABLET | ORAL | Status: AC
  Filled 2020-07-10: qty 1

## 2020-07-10 MED ORDER — LIDOCAINE HCL (PF) 2 % IJ SOLN
INTRAMUSCULAR | Status: AC
  Filled 2020-07-10: qty 5

## 2020-07-10 MED ORDER — DEXAMETHASONE SODIUM PHOSPHATE 10 MG/ML IJ SOLN
INTRAMUSCULAR | Status: DC | PRN
  Administered 2020-07-10: 10 mg via INTRAVENOUS

## 2020-07-10 MED ORDER — FENTANYL CITRATE (PF) 100 MCG/2ML IJ SOLN
INTRAMUSCULAR | Status: AC
  Filled 2020-07-10: qty 2

## 2020-07-10 MED ORDER — ONDANSETRON HCL 4 MG/2ML IJ SOLN
INTRAMUSCULAR | Status: AC
  Filled 2020-07-10: qty 2

## 2020-07-10 MED ORDER — PROMETHAZINE HCL 25 MG/ML IJ SOLN: 6.2500 mg | INTRAMUSCULAR | Status: DC | PRN

## 2020-07-10 MED ORDER — MIDAZOLAM HCL 2 MG/2ML IJ SOLN
INTRAMUSCULAR | Status: AC
  Filled 2020-07-10: qty 2

## 2020-07-10 MED ORDER — SEVOFLURANE IN SOLN
RESPIRATORY_TRACT | Status: AC
  Filled 2020-07-10: qty 250

## 2020-07-10 MED ORDER — PROPOFOL 10 MG/ML IV BOLUS
INTRAVENOUS | Status: DC | PRN
  Administered 2020-07-10: 150 mg via INTRAVENOUS

## 2020-07-10 MED ORDER — ACETAMINOPHEN 10 MG/ML IV SOLN
INTRAVENOUS | Status: DC | PRN
  Administered 2020-07-10: 1000 mg via INTRAVENOUS

## 2020-07-10 MED ORDER — EPHEDRINE SULFATE 50 MG/ML IJ SOLN
INTRAMUSCULAR | Status: DC | PRN
  Administered 2020-07-10: 20 mg via INTRAVENOUS

## 2020-07-10 MED ORDER — OXYCODONE HCL 5 MG PO TABS: 5.0000 mg | ORAL_TABLET | Freq: Once | ORAL | Status: DC | PRN

## 2020-07-10 MED ORDER — MIDAZOLAM HCL 2 MG/2ML IJ SOLN
INTRAMUSCULAR | Status: DC | PRN
  Administered 2020-07-10: 2 mg via INTRAVENOUS

## 2020-07-10 MED ORDER — PROPOFOL 10 MG/ML IV BOLUS
INTRAVENOUS | Status: AC
  Filled 2020-07-10: qty 20

## 2020-07-10 SURGICAL SUPPLY — 33 items
BAG DRAIN CYSTO-URO LG1000N (MISCELLANEOUS) ×2 IMPLANT
BASKET ZERO TIP 1.9FR (BASKET) ×1 IMPLANT
BRUSH SCRUB EZ 1% IODOPHOR (MISCELLANEOUS) ×2 IMPLANT
BSKT STON RTRVL ZERO TP 1.9FR (BASKET) ×1
CATH URETL 5X70 OPEN END (CATHETERS) IMPLANT
CNTNR SPEC 2.5X3XGRAD LEK (MISCELLANEOUS) ×2
CONT SPEC 4OZ STER OR WHT (MISCELLANEOUS) ×2
CONT SPEC 4OZ STRL OR WHT (MISCELLANEOUS) ×2
CONTAINER SPEC 2.5X3XGRAD LEK (MISCELLANEOUS) IMPLANT
DRAPE UTILITY 15X26 TOWEL STRL (DRAPES) ×2 IMPLANT
GLOVE BIOGEL PI IND STRL 7.5 (GLOVE) ×1 IMPLANT
GLOVE BIOGEL PI INDICATOR 7.5 (GLOVE) ×1
GOWN STRL REUS W/ TWL LRG LVL3 (GOWN DISPOSABLE) ×1 IMPLANT
GOWN STRL REUS W/ TWL XL LVL3 (GOWN DISPOSABLE) ×1 IMPLANT
GOWN STRL REUS W/TWL LRG LVL3 (GOWN DISPOSABLE) ×2
GOWN STRL REUS W/TWL XL LVL3 (GOWN DISPOSABLE) ×2
GUIDEWIRE STR DUAL SENSOR (WIRE) ×2 IMPLANT
INFUSOR MANOMETER BAG 3000ML (MISCELLANEOUS) ×2 IMPLANT
INTRODUCER DILATOR DOUBLE (INTRODUCER) IMPLANT
KIT TURNOVER CYSTO (KITS) ×2 IMPLANT
MANIFOLD NEPTUNE II (INSTRUMENTS) ×2 IMPLANT
PACK CYSTO AR (MISCELLANEOUS) ×2 IMPLANT
SET CYSTO W/LG BORE CLAMP LF (SET/KITS/TRAYS/PACK) ×2 IMPLANT
SHEATH URETERAL 12FRX35CM (MISCELLANEOUS) IMPLANT
SOL .9 NS 3000ML IRR  AL (IV SOLUTION) ×2
SOL .9 NS 3000ML IRR AL (IV SOLUTION) ×1
SOL .9 NS 3000ML IRR UROMATIC (IV SOLUTION) ×1 IMPLANT
STENT URET 6FRX24 CONTOUR (STENTS) ×1 IMPLANT
STENT URET 6FRX26 CONTOUR (STENTS) IMPLANT
SURGILUBE 2OZ TUBE FLIPTOP (MISCELLANEOUS) ×2 IMPLANT
TRACTIP FLEXIVA PULSE ID 200 (Laser) ×2 IMPLANT
VALVE UROSEAL ADJ ENDO (VALVE) ×1 IMPLANT
WATER STERILE IRR 1000ML POUR (IV SOLUTION) ×2 IMPLANT

## 2020-07-10 NOTE — Anesthesia Preprocedure Evaluation (Signed)
Anesthesia Evaluation  Patient identified by MRN, date of birth, ID band Patient awake    Reviewed: Allergy & Precautions, NPO status , Patient's Chart, lab work & pertinent test results  History of Anesthesia Complications Negative for: history of anesthetic complications  Airway Mallampati: III  TM Distance: >3 FB Neck ROM: Full    Dental no notable dental hx.    Pulmonary sleep apnea (did not get CPAP because it wasn't covered by insurance) , neg COPD, former smoker,    breath sounds clear to auscultation- rhonchi (-) wheezing      Cardiovascular hypertension, Pt. on medications (-) CAD, (-) Past MI, (-) Cardiac Stents and (-) CABG  Rhythm:Regular Rate:Normal - Systolic murmurs and - Diastolic murmurs    Neuro/Psych  Headaches, neg Seizures PSYCHIATRIC DISORDERS Anxiety Depression    GI/Hepatic negative GI ROS, Neg liver ROS,   Endo/Other  negative endocrine ROSneg diabetes  Renal/GU Renal disease (nephrolithiasis)     Musculoskeletal negative musculoskeletal ROS (+)   Abdominal (+) + obese,   Peds  Hematology negative hematology ROS (+)   Anesthesia Other Findings Past Medical History: No date: Anxiety 02/2019: BRCA negative     Comment:  MyRisk neg except AXIN2 VUS No date: Depression No date: Head cold No date: History of frequent urinary tract infections No date: History of kidney stones No date: Hypertension     Comment:  before gastric bypass 02/2019: Increased risk of breast cancer     Comment:  IBIS=66% due to hx of lobular CIS No date: Kidney stones No date: Left ureteral calculus 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 [name] No date: Sleep apnea     Comment:  no- c-pap   Reproductive/Obstetrics                             Anesthesia Physical Anesthesia Plan  ASA: II  Anesthesia Plan: General   Post-op Pain Management:     Induction: Intravenous  PONV Risk Score and Plan: 2  Airway Management Planned: Oral ETT  Additional Equipment:   Intra-op Plan:   Post-operative Plan: Extubation in OR  Informed Consent: I have reviewed the patients History and Physical, chart, labs and discussed the procedure including the risks, benefits and alternatives for the proposed anesthesia with the patient or authorized representative who has indicated his/her understanding and acceptance.     Dental advisory given  Plan Discussed with: CRNA and Anesthesiologist  Anesthesia Plan Comments:         Anesthesia Quick Evaluation

## 2020-07-10 NOTE — Anesthesia Postprocedure Evaluation (Signed)
Anesthesia Post Note  Patient: Ariana Weeks  Procedure(s) Performed: CYSTOSCOPY/URETEROSCOPY/HOLMIUM LASER/STENT PLACEMENT (Left )  Patient location during evaluation: PACU Anesthesia Type: General Level of consciousness: awake and alert and oriented Pain management: pain level controlled Vital Signs Assessment: post-procedure vital signs reviewed and stable Respiratory status: spontaneous breathing, nonlabored ventilation and respiratory function stable Cardiovascular status: blood pressure returned to baseline and stable Postop Assessment: no signs of nausea or vomiting Anesthetic complications: no   No complications documented.   Last Vitals:  Vitals:   07/10/20 0919 07/10/20 0931  BP: 109/60 113/69  Pulse: 86 80  Resp: 19 18  Temp:  36.8 C  SpO2: 97% 99%    Last Pain:  Vitals:   07/10/20 0931  TempSrc: Temporal  PainSc: 8                  Deontrae Drinkard

## 2020-07-10 NOTE — Op Note (Signed)
Preoperative diagnosis:  1. Left distal ureteral calculus 2. Left nephrolithiasis  Postoperative diagnosis:  Same  Procedure:  1. Cystoscopy 2. Left ureteroscopy and stone removal 3. Ureteroscopic laser lithotripsy 4. Left ureteral stent placement (6FR/24 cm) 5. Left retrograde pyelography with interpretation  Surgeon: Lorin Picket C. Ahmaya Ostermiller, M.D.  Anesthesia: General  Complications: None  Intraoperative findings:  1.  Cystoscopy-bladder mucosa normal in appearance without erythema, solid or papillary lesions 2.  Ureteroscopy-left distal ureteral calculus with marked mucosal edema at the level of the calculus 3.  Pyeloscopy-multiple Randall's plaques; elongated 3 x 5 mm left lower pole calculus 4.  Left retrograde pyelography post procedure showed no filling defects, stone fragments or contrast extravasation  EBL: Minimal  Specimens: 1. Calculus fragments for analysis   Indication: Ariana Weeks is a 49 y.o. female with intermittent left lower quadrant abdominal pain for the last 6 months.  Recent CT showed a 7 mm distal ureteral calculus with mild hydronephrosis. After reviewing the management options for treatment, the patient elected to proceed with the above surgical procedure(s). We have discussed the potential benefits and risks of the procedure, side effects of the proposed treatment, the likelihood of the patient achieving the goals of the procedure, and any potential problems that might occur during the procedure or recuperation. Informed consent has been obtained.  Description of procedure:  The patient was taken to the operating room and general anesthesia was induced.  The patient was placed in the dorsal lithotomy position, prepped and draped in the usual sterile fashion, and preoperative antibiotics were administered. A preoperative time-out was performed.   A 21 French cystoscope was lubricated and passed per urethra. Panendoscopy was performed and the bladder mucosa  showed no erythema, solid or papillary lesions.  Attention was directed to the left ureteral orifice and a 0.038 Sensor wire was then advanced up the ureter into the renal pelvis under fluoroscopic guidance.  A 4.5 Fr semirigid ureteroscope was then advanced into the ureter next to the guidewire and the calculus was identified as described above.  The stone was then fragmented with a 200 micron holmium laser fiber on a setting of 0.2 J and frequency of 20 hz which was increased to 0.2/40.   All fragments were then removed from the ureter with a zero tip nitinol basket.  Reinspection of the ureter revealed no remaining visible stones or fragments.   A single channel digital flexible ureteroscope was then passed per urethra and the ureteral orifice was easily engaged alongside the guidewire and the ureteroscope was advanced into the renal pelvis and pyeloscopy was performed with findings as described above.  Left retrograde pyelogram was performed through the scope with findings as described above.  All calyces were examined and the only calculus identified was in the lower pole calyx as described above.  The calculus was placed in 1.9 French nitinol basket and removed without difficulty.  A 6FR/24 cm Contour ureteral stent was placed under fluoroscopic guidance.  The wire was then removed with an adequate stent curl noted in an upper pole calyx as well as in the bladder.  The bladder was then emptied and the procedure ended.  The patient appeared to tolerate the procedure well and without complications.  After anesthetic reversal the patient was transported to the PACU in stable condition.   Plan:  She will return on Tuesday, 07/17/2020 for cystoscopy with stent removal   Irineo Axon, MD

## 2020-07-10 NOTE — Interval H&P Note (Signed)
History and Physical Interval Note: CV:RRR Lungs:clear  07/10/2020 7:30 AM  Ariana Weeks  has presented today for surgery, with the diagnosis of left ureteral stone.  The various methods of treatment have been discussed with the patient and family. After consideration of risks, benefits and other options for treatment, the patient has consented to  Procedure(s): CYSTOSCOPY/URETEROSCOPY/HOLMIUM LASER/STENT PLACEMENT (Left) as a surgical intervention.  The patient's history has been reviewed, patient examined, no change in status, stable for surgery.  I have reviewed the patient's chart and labs.  Questions were answered to the patient's satisfaction.     Shlok Raz C Samyuktha Brau

## 2020-07-10 NOTE — Transfer of Care (Signed)
Immediate Anesthesia Transfer of Care Note  Patient: Ariana Weeks  Procedure(s) Performed: CYSTOSCOPY/URETEROSCOPY/HOLMIUM LASER/STENT PLACEMENT (Left )  Patient Location: PACU  Anesthesia Type:General  Level of Consciousness: sedated  Airway & Oxygen Therapy: Patient Spontanous Breathing and Patient connected to face mask oxygen  Post-op Assessment: Report given to RN and Post -op Vital signs reviewed and stable  Post vital signs: Reviewed and stable  Last Vitals:  Vitals Value Taken Time  BP 127/81 07/10/20 0839  Temp    Pulse 95 07/10/20 0840  Resp 14 07/10/20 0840  SpO2 95 % 07/10/20 0840  Vitals shown include unvalidated device data.  Last Pain:  Vitals:   07/10/20 0617  TempSrc: Oral  PainSc: 8          Complications: No complications documented.

## 2020-07-10 NOTE — Discharge Instructions (Signed)
AMBULATORY SURGERY  DISCHARGE INSTRUCTIONS   1) The drugs that you were given will stay in your system until tomorrow so for the next 24 hours you should not:  A) Drive an automobile B) Make any legal decisions C) Drink any alcoholic beverage   2) You may resume regular meals tomorrow.  Today it is better to start with liquids and gradually work up to solid foods.  You may eat anything you prefer, but it is better to start with liquids, then soup and crackers, and gradually work up to solid foods.   3) Please notify your doctor immediately if you have any unusual bleeding, trouble breathing, redness and pain at the surgery site, drainage, fever, or pain not relieved by medication.  4) Your post-operative visit with Dr.                                     is: Date:                        Time:    Please call to schedule your post-operative visit.  5) Additional Instructions:   DISCHARGE INSTRUCTIONS FOR KIDNEY STONE/URETERAL STENT   MEDICATIONS:  1. Resume all your other meds from home.  2.  AZO (over-the-counter) can help with the burning/stinging when you urinate. 3.  Oxybutynin and tamsulosin are for bladder/stent irritation, Rxs or sent to your pharmacy.  ACTIVITY:  1. May resume regular activities in 24 hours. 2. No driving while on narcotic pain medications  3. Drink plenty of water  4. Continue to walk at home - you can still get blood clots when you are at home, so keep active, but don't over do it.  5. May return to work/school tomorrow or when you feel ready   SIGNS/SYMPTOMS TO CALL:  Please call us if you have a fever greater than 101.5, uncontrolled nausea/vomiting, uncontrolled pain, dizziness, unable to urinate, excessively bloody urine, chest pain, shortness of breath, leg swelling, leg pain, or any other concerns or questions.   Common postoperative symptoms include urinary frequency, urgency, bladder spasm and blood in urine  You can reach [place] at  [phone].   FOLLOW-UP:  1. You have a follow-up appointment scheduled Monday, 07/16/2020 at 1:30 PM for stent removal.  Please arrive 15 minutes early

## 2020-07-10 NOTE — Anesthesia Procedure Notes (Signed)
Procedure Name: LMA Insertion Performed by: Taccara Bushnell R, CRNA Pre-anesthesia Checklist: Patient identified, Emergency Drugs available, Suction available and Patient being monitored Patient Re-evaluated:Patient Re-evaluated prior to induction Oxygen Delivery Method: Circle system utilized Preoxygenation: Pre-oxygenation with 100% oxygen Induction Type: IV induction LMA: LMA inserted LMA Size: 3.0 Tube type: Oral Number of attempts: 1 Placement Confirmation: positive ETCO2 and breath sounds checked- equal and bilateral Dental Injury: Teeth and Oropharynx as per pre-operative assessment        

## 2020-07-11 ENCOUNTER — Telehealth: Payer: Self-pay | Admitting: Urology

## 2020-07-11 LAB — URINE CULTURE: Culture: <10000 — AB

## 2020-07-11 NOTE — Telephone Encounter (Signed)
-----   Message from Riki Altes, MD sent at 07/10/2020  4:06 PM EST ----- Regarding: Stent removal I only have one case so far scheduled next Tuesday.  Will you schedule this patient for cystoscopy with stent removal around 130 next Tuesday?  You can adjust if needed due to room availability.  Thanks

## 2020-07-11 NOTE — Telephone Encounter (Signed)
APP MADE PATIENT IS AWARE 

## 2020-07-12 ENCOUNTER — Other Ambulatory Visit: Payer: Self-pay | Admitting: Urology

## 2020-07-13 LAB — CALCULI, WITH PHOTOGRAPH (CLINICAL LAB)
Calcium Oxalate Dihydrate: 60 %
Calcium Oxalate Monohydrate: 40 %
Weight Calculi: 17 mg

## 2020-07-14 ENCOUNTER — Other Ambulatory Visit: Payer: Self-pay | Admitting: Urology

## 2020-07-17 ENCOUNTER — Other Ambulatory Visit: Payer: Self-pay

## 2020-07-17 ENCOUNTER — Ambulatory Visit (INDEPENDENT_AMBULATORY_CARE_PROVIDER_SITE_OTHER): Payer: BC Managed Care – PPO | Admitting: Urology

## 2020-07-17 ENCOUNTER — Encounter: Payer: Self-pay | Admitting: Urology

## 2020-07-17 VITALS — BP 116/75 | HR 113 | Ht 62.0 in | Wt 165.0 lb

## 2020-07-17 DIAGNOSIS — N200 Calculus of kidney: Secondary | ICD-10-CM | POA: Diagnosis not present

## 2020-07-17 MED ORDER — CIPROFLOXACIN HCL 500 MG PO TABS
500.0000 mg | ORAL_TABLET | Freq: Once | ORAL | Status: AC
  Administered 2020-07-17: 500 mg via ORAL

## 2020-07-17 NOTE — Progress Notes (Signed)
Indications: Patient is 49 y.o., who is s/p ureteroscopic removal of left distal ureteral and left renal calculi on 07/10/2020. She had no postoperative complications. The patient is presenting today for stent removal. Today she is complaining of left low back pain which is lower lumbar/waist area suspicious for musculoskeletal pain. Urinalysis did not show evidence of infection.  Procedure:  Flexible Cystoscopy with stent removal (58309)  Timeout was performed and the correct patient, procedure and participants were identified.    Description:  The patient was prepped and draped in the usual sterile fashion. Flexible cystosopy was performed.  The stent was visualized, grasped, and removed intact without difficulty. The patient tolerated the procedure well.  A single dose of oral antibiotics was given.  Complications:  None  Plan:   Stone analysis was mixed calcium oxalate  We discussed a metabolic evaluation which she states she has done in the past but not sure how long ago  We will have her follow-up in 1 month and will discuss metabolic evaluation further on follow-up  Irineo Axon, MD

## 2020-07-18 ENCOUNTER — Ambulatory Visit: Payer: BC Managed Care – PPO | Admitting: Dietician

## 2020-07-18 ENCOUNTER — Other Ambulatory Visit: Payer: Self-pay | Admitting: *Deleted

## 2020-07-18 DIAGNOSIS — N200 Calculus of kidney: Secondary | ICD-10-CM

## 2020-07-23 LAB — URINALYSIS, COMPLETE
Bilirubin, UA: NEGATIVE
Glucose, UA: NEGATIVE
Ketones, UA: NEGATIVE
Nitrite, UA: NEGATIVE
Specific Gravity, UA: 1.02 (ref 1.005–1.030)
Urobilinogen, Ur: 0.2 mg/dL (ref 0.2–1.0)
pH, UA: 5.5 (ref 5.0–7.5)

## 2020-07-23 LAB — MICROSCOPIC EXAMINATION
Epithelial Cells (non renal): >10 /hpf — AB (ref 0–10)
RBC, Urine: >30 /hpf — AB (ref 0–2)

## 2020-07-31 ENCOUNTER — Other Ambulatory Visit: Payer: Self-pay | Admitting: General Surgery

## 2020-07-31 DIAGNOSIS — R920 Mammographic microcalcification found on diagnostic imaging of breast: Secondary | ICD-10-CM

## 2020-08-22 ENCOUNTER — Ambulatory Visit (INDEPENDENT_AMBULATORY_CARE_PROVIDER_SITE_OTHER): Payer: BC Managed Care – PPO | Admitting: Urology

## 2020-08-22 ENCOUNTER — Encounter: Payer: Self-pay | Admitting: Urology

## 2020-08-22 ENCOUNTER — Ambulatory Visit
Admission: RE | Admit: 2020-08-22 | Discharge: 2020-08-22 | Disposition: A | Discharge status: Home / Self Care | Payer: BC Managed Care – PPO | Source: Ambulatory Visit | Attending: Urology | Admitting: Urology

## 2020-08-22 ENCOUNTER — Other Ambulatory Visit: Payer: Self-pay

## 2020-08-22 VITALS — BP 103/71 | HR 71 | Ht 62.0 in | Wt 170.0 lb

## 2020-08-22 DIAGNOSIS — N200 Calculus of kidney: Secondary | ICD-10-CM

## 2020-08-22 NOTE — Progress Notes (Signed)
08/22/2020 1:20 PM   Ariana Weeks 09/13/1970 [id number]  Referring provider: [name], MD Ariana Weeks,  [place] [address]  Chief Complaint  Patient presents with  . Nephrolithiasis    HPI: 49 y.o. female presents for postop follow-up.   Ureteroscopic removal of the left distal ureteral calculus and left renal calculus 07/10/2020  Stent removed 11/23  No problems post stent removal and states all symptoms resolved after removal of the stent  Stone analysis mixed CaOX; 40% mono/60% dihydrate   PMH: Past Medical History:  Diagnosis Date  . Anxiety   . BRCA negative 02/2019   MyRisk neg except AXIN2 VUS  . Depression   . Head cold   . History of frequent urinary tract infections   . History of kidney stones   . Hypertension    before gastric bypass  . Increased risk of breast cancer 02/2019   IBIS=66% due to hx of lobular CIS  . Kidney stones   . Left ureteral calculus   . 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 Bary Weeks  . Sleep apnea    no- c-pap    Surgical History: Past Surgical History:  Procedure Laterality Date  . BREAST BIOPSY Right 02/02/2013   [place] bx/clip-neg  . BREAST BIOPSY Right 02/19/2017   affirm, ATYPICAL LOBULAR HYPERPLASIA  . BREAST BIOPSY Right 03/04/2017   Procedure: BREAST BIOPSY WITH NEEDLE LOCALIZATION;  Surgeon: Ariana Bellow, MD;  Location: [facility] ORS;  Service: General;  Laterality: Right;  . BREAST LUMPECTOMY Right 2018  . CESAREAN SECTION  2005 2015  . CHOLECYSTECTOMY  09/2012  . CYSTOSCOPY W/ RETROGRADES N/A 02/02/2017   Procedure: CYSTOSCOPY WITH RETROGRADE PYELOGRAM;  Surgeon: Ariana Seal, MD;  Location: [facility] ORS;  Service: Urology;  Laterality: N/A;  . CYSTOSCOPY/URETEROSCOPY/HOLMIUM LASER/STENT PLACEMENT Left 07/10/2020   Procedure: CYSTOSCOPY/URETEROSCOPY/HOLMIUM LASER/STENT PLACEMENT;  Surgeon: Ariana Sons, MD;  Location: [facility] ORS;  Service: Urology;  Laterality: Left;  .  EXTRACORPOREAL SHOCK WAVE LITHOTRIPSY  2009  . INTRAUTERINE DEVICE INSERTION  MARCH 2012   [place]  . LAPAROSCOPIC GASTRIC SLEEVE RESECTION N/A 08/09/2019   Procedure: LAPAROSCOPIC GASTRIC SLEEVE RESECTION, Upper Endo, ER[place];  Surgeon: Ariana Hausen, MD;  Location: [facility];  Service: General;  Laterality: N/A;  . [name]  (APPROX)   Kidney Stone  . URETEROSCOPY WITH HOLMIUM LASER LITHOTRIPSY Left 02/02/2017   Procedure: URETEROSCOPY WITH HOLMIUM LASER LITHOTRIPSY;  Surgeon: Ariana Seal, MD;  Location: [facility] ORS;  Service: Urology;  Laterality: Left;    Home Medications:  Allergies as of 08/22/2020      Reactions   Dilaudid [hydromorphone Hcl] Itching   Red face and itching   Azithromycin    Other reaction(s): Unknown   Nitrofurantoin Hives   Macrobid   Morphine And Related Hives, Rash      Medication List       Accurate as of August 22, 2020  1:20 PM. If you have any questions, ask your nurse or doctor.        STOP taking these medications   mirabegron ER 25 MG Tb24 tablet Commonly known as: MYRBETRIQ Stopped by: Ariana Sons, MD   oxybutynin 5 MG tablet Commonly known as: DITROPAN Stopped by: Ariana Sons, MD   potassium chloride 10 MEQ tablet Commonly known as: KLOR-CON Stopped by: Ariana Sons, MD   tamsulosin 0.4 MG Caps capsule Commonly known as: FLOMAX Stopped  by: Ariana Sons, MD   traMADol 50 MG tablet Commonly known as: ULTRAM Stopped by: Ariana Sons, MD   Uribel 118 MG Caps Stopped by: Ariana Sons, MD     TAKE these medications   Bariatric Fusion Chew Chew 1 each by mouth 2 (two) times daily.   buPROPion 300 MG 24 hr tablet Commonly known as: WELLBUTRIN XL Take 1 tablet (300 mg total) by mouth daily.   hydrochlorothiazide 25 MG tablet Commonly known as: HYDRODIURIL TAKE 1 TABLET(25 MG) BY MOUTH DAILY   levonorgestrel 20 MCG/24HR IUD Commonly known as: MIRENA 1 each by Intrauterine route  once.   PARoxetine 10 MG tablet Commonly known as: PAXIL TAKE 1 TABLET(10 MG) BY MOUTH DAILY   raloxifene 60 MG tablet Commonly known as: EVISTA TAKE 1 TABLET(60 MG) BY MOUTH DAILY What changed: See the new instructions.       Allergies:  Allergies  Allergen Reactions  . Dilaudid [Hydromorphone Hcl] Itching    Red face and itching  . Azithromycin     Other reaction(s): Unknown  . Nitrofurantoin Hives    Macrobid  . Morphine And Related Hives and Rash    Family History: Family History  Problem Relation Age of Onset  . Lung cancer Father   . Other Mother        breast cyst  . Breast cancer Mother 35       tested  . Alcohol abuse Maternal Uncle   . Alcohol abuse Paternal Uncle   . Ovarian cancer Maternal Grandmother 76  . Lymphoma Maternal Grandfather        cancer  . Breast cancer Maternal Aunt [address] late 8s  . Alcohol abuse Paternal Grandfather     Social History:  reports that she quit smoking about 6 years ago. Her smoking use included cigarettes. She has a 8.00 pack-year smoking history. She has never used smokeless tobacco. She reports previous alcohol use. She reports that she does not use drugs.   Physical Exam: BP 103/71   Pulse 71   Ht 5' 2"  (1.575 m)   Wt 170 lb (77.1 kg)   BMI 31.09 kg/m   Constitutional:  Alert and oriented, No acute distress. HEENT: [place] AT, moist mucus membranes.  Trachea midline, no masses. Cardiovascular: No clubbing, cyanosis, or edema. Respiratory: Normal respiratory effort, no increased work of breathing.   Pertinent Imaging: KUB performed today was personally reviewed and there are no calcifications seen suspicious for recurrent urinary tract calculi   Assessment & Plan:    1.  Nephrolithiasis  Doing well status post ureteroscopic stone removal  CT did show bilateral punctate nonobstructing renal calculi  KUB today negative  We discussed repeat metabolic evaluation however she has elected  general stone prevention guidelines and was provided literature  Follow-up 1 year with [place], MD  [place] [address] [place], [place] [address] [phone]

## 2020-08-23 ENCOUNTER — Ambulatory Visit: Payer: BC Managed Care – PPO | Admitting: Urology

## 2020-08-27 ENCOUNTER — Encounter: Payer: Self-pay | Admitting: Dietician

## 2020-08-27 NOTE — Progress Notes (Signed)
Have not heard back from patient to reschedule her cancelled appointment from 07/18/20. Sent notification to referring provider.

## 2020-09-12 ENCOUNTER — Other Ambulatory Visit: Payer: BC Managed Care – PPO

## 2020-09-20 ENCOUNTER — Other Ambulatory Visit: Payer: BC Managed Care – PPO

## 2020-09-21 ENCOUNTER — Other Ambulatory Visit: Payer: BC Managed Care – PPO

## 2020-09-26 ENCOUNTER — Encounter: Payer: Self-pay | Admitting: Obstetrics and Gynecology

## 2020-09-26 ENCOUNTER — Other Ambulatory Visit: Payer: Self-pay

## 2020-09-26 ENCOUNTER — Ambulatory Visit (INDEPENDENT_AMBULATORY_CARE_PROVIDER_SITE_OTHER): Payer: BC Managed Care – PPO | Admitting: Obstetrics and Gynecology

## 2020-09-26 ENCOUNTER — Other Ambulatory Visit (HOSPITAL_COMMUNITY)
Admission: RE | Admit: 2020-09-26 | Discharge: 2020-09-26 | Disposition: A | Discharge status: Home / Self Care | Payer: Self-pay | Source: Ambulatory Visit | Attending: Obstetrics and Gynecology | Admitting: Obstetrics and Gynecology

## 2020-09-26 VITALS — BP 110/70 | Ht 62.0 in | Wt 175.0 lb

## 2020-09-26 DIAGNOSIS — Z1151 Encounter for screening for human papillomavirus (HPV): Secondary | ICD-10-CM | POA: Insufficient documentation

## 2020-09-26 DIAGNOSIS — D0501 Lobular carcinoma in situ of right breast: Secondary | ICD-10-CM

## 2020-09-26 DIAGNOSIS — Z124 Encounter for screening for malignant neoplasm of cervix: Secondary | ICD-10-CM

## 2020-09-26 DIAGNOSIS — Z30431 Encounter for routine checking of intrauterine contraceptive device: Secondary | ICD-10-CM | POA: Diagnosis not present

## 2020-09-26 NOTE — Patient Instructions (Signed)
I value your feedback and you entrusting us with your care. If you get a [facility] patient survey, I would appreciate you taking the time to let us know about your experience today. Thank you! ? ? ?

## 2020-09-26 NOTE — Progress Notes (Signed)
Ariana Mc, MD   Chief Complaint  Patient presents with  . Contraception    IUD removal, interested in OCP's    HPI:      [name] is a 50 y.o. [id number] whose LMP was No LMP recorded. (Menstrual status: IUD)., presents today for IUD removal. Mirena placed 09/25/14. No VB/BTB. Has occas mild dysmen. Pt wanted removed today but not aware that it has 7 yrs indication now. She has anxiety about removal/replacement and would like OCPs. Pt with hx of lobular CIS RT breast, followed by Dr. [name] and on evista as chemoprevention. Pt is MyRisk neg with VUS. Pt also with some recent changes on breast imaging and due for f/u 2/22 with Dr. [name] appt afterwards.  I sent Dr. [name] message about [facility] options with her hx and Evista. See his response below: From UpToDate: Contraceptives - Breast cancer risk is temporarily increased with current or recent use of combined oral contraceptives, but this association disappeared within two to five years of discontinuation. Additional data on the risks of hormone therapy in young women, particularly centered on estrogen-progestin contraceptives, are discussed separately. (See "Combined estrogen-progestin contraception: Side effects and health concerns", section on 'Breast cancer'.) Whether Evista can "overide" this risk is unclear (to me). Drugs.com com reports no controled studies of oral estrogens and Evista. A non-systemic contraceptive would be preferable  Pt is due for pap. Didn't have annual last yr.  S/p bariatric surg last yr with good wt loss. Doing well.  Past Medical History:  Diagnosis Date  . Anxiety   . BRCA negative 02/2019   MyRisk neg except AXIN2 VUS  . Depression   . Head cold   . History of frequent urinary tract infections   . History of kidney stones   . Hypertension    before gastric bypass  . Increased risk of breast cancer 02/2019   IBIS=66% due to hx of lobular CIS  . Kidney stones   . Left ureteral calculus   .  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 [name]  . Sleep apnea    no- c-pap    Past Surgical History:  Procedure Laterality Date  . BREAST BIOPSY Right 02/02/2013   [place] bx/clip-neg  . BREAST BIOPSY Right 02/19/2017   affirm, ATYPICAL LOBULAR HYPERPLASIA  . BREAST BIOPSY Right 03/04/2017   Procedure: BREAST BIOPSY WITH NEEDLE LOCALIZATION;  Surgeon: [name], MD;  Location: [facility] ORS;  Service: General;  Laterality: Right;  . BREAST LUMPECTOMY Right 2018  . CESAREAN SECTION  2005 2015  . CHOLECYSTECTOMY  09/2012  . CYSTOSCOPY W/ RETROGRADES N/A 02/02/2017   Procedure: CYSTOSCOPY WITH RETROGRADE PYELOGRAM;  Surgeon: [name], MD;  Location: [facility] ORS;  Service: Urology;  Laterality: N/A;  . CYSTOSCOPY/URETEROSCOPY/HOLMIUM LASER/STENT PLACEMENT Left 07/10/2020   Procedure: CYSTOSCOPY/URETEROSCOPY/HOLMIUM LASER/STENT PLACEMENT;  Surgeon: [name], MD;  Location: [facility] ORS;  Service: Urology;  Laterality: Left;  . EXTRACORPOREAL SHOCK WAVE LITHOTRIPSY  2009  . INTRAUTERINE DEVICE INSERTION  MARCH 2012   MURINA  . LAPAROSCOPIC GASTRIC SLEEVE RESECTION N/A 08/09/2019   Procedure: LAPAROSCOPIC GASTRIC SLEEVE RESECTION, Upper Endo, [facility] [place];  Surgeon: [name], MD;  Location: [facility];  Service: General;  Laterality: N/A;  . [place]  (APPROX)   Kidney Stone  . URETEROSCOPY WITH HOLMIUM LASER LITHOTRIPSY Left 02/02/2017   Procedure: URETEROSCOPY WITH HOLMIUM LASER LITHOTRIPSY;  Surgeon: [name], MD;  Location: [facility] ORS;  Service: Urology;  Laterality: Left;    Family History  Problem Relation Age of Onset  . Lung cancer Father   . Other Mother        breast cyst  . Breast cancer Mother 61       tested  . Alcohol abuse Maternal Uncle   . Alcohol abuse Paternal Uncle   . Ovarian cancer Maternal Grandmother 76  . Lymphoma Maternal Grandfather        cancer  . Breast cancer Maternal Aunt [address] late 43s  .  Alcohol abuse Paternal Grandfather     Social History   Socioeconomic History  . Marital status: Married    Spouse name: Not on file  . Number of children: 1  . Years of education: Not on file  . Highest education level: Not on file  Occupational History  . Occupation: Special ED Teacher    Comment: [employer]  Tobacco Use  . Smoking status: Former Smoker    Packs/day: 0.50    Years: 16.00    Pack years: 8.00    Types: Cigarettes    Quit date: 06/25/2014    Years since quitting: 6.2  . Smokeless tobacco: Never Used  Vaping Use  . Vaping Use: Never used  Substance and Sexual Activity  . Alcohol use: Not Currently    Alcohol/week: 0.0 standard drinks  . Drug use: No  . Sexual activity: Yes    Birth control/protection: I.U.D.    Comment: Mirena  Other Topics Concern  . Not on file  Social History Narrative   No regular exercise.      Nutrition: 3 meals a day, limited fruits and vegetables, fast meals occasionally      Cell: [phone]      Married   Investment banker, corporate education   2 children    Caffeine- 1-2 cups of coke daily   Social Determinants of Health   Financial Resource Strain: Not on file  Food Insecurity: Not on file  Transportation Needs: Not on file  Physical Activity: Not on file  Stress: Not on file  Social Connections: Not on file  Intimate Partner Violence: Not on file    Outpatient Medications Prior to Visit  Medication Sig Dispense Refill  . buPROPion (WELLBUTRIN XL) 300 MG 24 hr tablet Take 1 tablet (300 mg total) by mouth daily. 90 tablet 0  . hydrochlorothiazide (HYDRODIURIL) 25 MG tablet TAKE 1 TABLET(25 MG) BY MOUTH DAILY 90 tablet 3  . Multiple Vitamins-Minerals (BARIATRIC FUSION) CHEW Chew 1 each by mouth 2 (two) times daily.    Marland Kitchen PARoxetine (PAXIL) 10 MG tablet TAKE 1 TABLET(10 MG) BY MOUTH DAILY 90 tablet 1  . raloxifene (EVISTA) 60 MG tablet TAKE 1 TABLET(60 MG) BY MOUTH DAILY (Patient taking differently: Take  60 mg by mouth daily.) 30 tablet 12  . levonorgestrel (MIRENA) 20 MCG/24HR IUD 1 each by Intrauterine route once.     No facility-administered medications prior to visit.      ROS:  Review of Systems  Constitutional: Negative for fever, malaise/fatigue and weight loss.  Gastrointestinal: Negative for blood in stool, constipation, diarrhea, nausea and vomiting.  Genitourinary: Negative for dyspareunia, dysuria, flank pain, frequency, hematuria, urgency, vaginal bleeding, vaginal discharge and vaginal pain.  Musculoskeletal: Negative for back pain.  Skin: Negative for itching and rash.   BREAST: No symptoms   OBJECTIVE:   Vitals:  BP 110/70  Ht 5' 2"  (1.575 m)   Wt 175 lb (79.4 kg)   BMI 32.01 kg/m   Physical Exam Vitals reviewed.  Constitutional:      Appearance: She is well-developed.  Pulmonary:     Effort: Pulmonary effort is normal.  Genitourinary:    General: Normal vulva.     Pubic Area: No rash.      Labia:        Right: No rash, tenderness or lesion.        Left: No rash, tenderness or lesion.      Vagina: Normal. No vaginal discharge, erythema or tenderness.     Cervix: Normal.     Uterus: Normal. Not enlarged and not tender.      Adnexa: Right adnexa normal and left adnexa normal.       Right: No mass or tenderness.         Left: No mass or tenderness.       Comments: IUD STRINGS IN CX OS Musculoskeletal:        General: Normal range of motion.     Cervical back: Normal range of motion.  Skin:    General: Skin is warm and dry.  Neurological:     General: No focal deficit present.     Mental Status: She is alert and oriented to person, place, and time.  Psychiatric:        Mood and Affect: Mood normal.        Behavior: Behavior normal.        Thought Content: Thought content normal.        Judgment: Judgment normal.     Assessment/Plan: Encounter for routine checking of intrauterine contraceptive device (IUD); IUD strings in cx os. Has 7 yr  indication so good till 09/2021. Pt would like different BC option but limited due to LCIS and currently on Evista. Non systemic BC option preferred.   Cervical cancer screening - Plan: Cytology - PAP  Screening for HPV (human papillomavirus) - Plan: Cytology - PAP  Lobular carcinoma in situ (LCIS) of right breast--has addl imaging 2/22 with subsequent f/u with Dr. [name].    Return in about 1 year (around 03/27/6628) for annual.  Jasmane Brockway B. Tytionna Cloyd, PA-C 09/26/2020 4:19 PM

## 2020-09-28 LAB — CYTOLOGY - PAP
Adequacy: ABSENT
Comment: NEGATIVE
Diagnosis: NEGATIVE
High risk HPV: NEGATIVE

## 2020-10-08 ENCOUNTER — Ambulatory Visit
Admission: RE | Admit: 2020-10-08 | Discharge: 2020-10-08 | Disposition: A | Discharge status: Home / Self Care | Payer: BC Managed Care – PPO | Source: Ambulatory Visit | Attending: General Surgery | Admitting: General Surgery

## 2020-10-08 ENCOUNTER — Other Ambulatory Visit: Payer: Self-pay

## 2020-10-08 DIAGNOSIS — R920 Mammographic microcalcification found on diagnostic imaging of breast: Secondary | ICD-10-CM

## 2020-10-22 ENCOUNTER — Telehealth: Payer: Self-pay

## 2020-10-22 MED ORDER — BUPROPION HCL ER (XL) 300 MG PO TB24
300.0000 mg | ORAL_TABLET | Freq: Every day | ORAL | 0 refills | Status: DC
Start: 2020-10-22 — End: 2020-11-05

## 2020-10-22 NOTE — Telephone Encounter (Signed)
Pt needs a refill on buPROPion (WELLBUTRIN XL) 300 MG 24 hr tablet. She has one pill left. She made an appt with Dr Darrick Huntsman for VV on 11/05/20. She has not been seen since 07/2019

## 2020-11-05 ENCOUNTER — Encounter: Payer: Self-pay | Admitting: Internal Medicine

## 2020-11-05 ENCOUNTER — Telehealth (INDEPENDENT_AMBULATORY_CARE_PROVIDER_SITE_OTHER): Payer: BC Managed Care – PPO | Admitting: Internal Medicine

## 2020-11-05 DIAGNOSIS — Z9884 Bariatric surgery status: Secondary | ICD-10-CM

## 2020-11-05 DIAGNOSIS — F419 Anxiety disorder, unspecified: Secondary | ICD-10-CM | POA: Diagnosis not present

## 2020-11-05 DIAGNOSIS — Z853 Personal history of malignant neoplasm of breast: Secondary | ICD-10-CM | POA: Insufficient documentation

## 2020-11-05 DIAGNOSIS — D0501 Lobular carcinoma in situ of right breast: Secondary | ICD-10-CM

## 2020-11-05 DIAGNOSIS — I10 Essential (primary) hypertension: Secondary | ICD-10-CM

## 2020-11-05 DIAGNOSIS — N200 Calculus of kidney: Secondary | ICD-10-CM

## 2020-11-05 DIAGNOSIS — E669 Obesity, unspecified: Secondary | ICD-10-CM

## 2020-11-05 DIAGNOSIS — F32A Depression, unspecified: Secondary | ICD-10-CM

## 2020-11-05 MED ORDER — PAROXETINE HCL 10 MG PO TABS
ORAL_TABLET | ORAL | 1 refills | Status: DC
Start: 2020-11-05 — End: 2021-07-30

## 2020-11-05 MED ORDER — BUPROPION HCL ER (XL) 300 MG PO TB24
300.0000 mg | ORAL_TABLET | Freq: Every day | ORAL | 1 refills | Status: DC
Start: 2020-11-05 — End: 2020-11-19

## 2020-11-05 NOTE — Assessment & Plan Note (Signed)
S/p lumpectomy .  Continue Evista,  Follow up with Byrnett.

## 2020-11-05 NOTE — Progress Notes (Signed)
Virtual Visit via [place]   This visit type was conducted due to national recommendations for restrictions regarding the COVID-19 pandemic (e.g. social distancing).  This format is felt to be most appropriate for this patient at this time.  All issues noted in this document were discussed and addressed.  No physical exam was performed (except for noted visual exam findings with Video Visits).   I connected with@ on 11/05/20 at  4:30 PM EDT by a video enabled telemedicine application  and verified that I am speaking with the correct person using two identifiers. Location patient: home Location provider: work or home office Persons participating in the virtual visit: patient, provider  I discussed the limitations, risks, security and privacy concerns of performing an evaluation and management service by telephone and the availability of in person appointments. I also discussed with the patient that there may be a patient responsible charge related to this service. The patient expressed understanding and agreed to proceed.  Reason for visit: follow up on medication management of depression/anxiety  HPI:  50 yr old yr female with history of breast cancer diagnosed in 2018,  S/p lumpectomy, now on Evista,  Morbid Obesity s/p gastric sleeve Dec 2020, hypertension and depression  prsetns for follow up .  Last encounter Dec 2020  Pre surgery.   Nephrolithiasis:  Recurrent,  Last one Nov 2021 , required stent placement and basket retrieval  Told she has more in the [place]  HTN:  Still taking HCTZ  Daily  Metoprolol stopped last BP 944/96  Sees Alicia copeland for IUD in place for the last several years good til 2023 Feb.    Gastric sleeve Dec 2020  lost 50 lbs   So far .  Drinking a gallon of water daily .  Using hctz for "fluid retention"  ROS: See pertinent positives and negatives per HPI.  Past Medical History:  Diagnosis Date  . Anxiety   . BRCA negative 02/2019   MyRisk neg except  AXIN2 VUS  . Depression   . Head cold   . History of frequent urinary tract infections   . History of kidney stones   . Hypertension    before gastric bypass  . Increased risk of breast cancer 02/2019   IBIS=66% due to hx of lobular CIS  . Kidney stones   . Left ureteral calculus   . 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 [name]  . Sleep apnea    no- c-pap  . Supervision of high risk pregnancy, antepartum 05/18/2014   Formatting of this note might be different from the original. Case discussed at Peds/Perinatal multidisciplinary conf 05/18/2014. Aortic atresia/mitral stenosis Delivery plan: repeat c-section at 39 weeks    Past Surgical History:  Procedure Laterality Date  . BREAST BIOPSY Right 02/02/2013   [place] bx/clip-neg  . BREAST BIOPSY Right 02/19/2017   affirm, ATYPICAL LOBULAR HYPERPLASIA  . BREAST BIOPSY Right 03/04/2017   Procedure: BREAST BIOPSY WITH NEEDLE LOCALIZATION;  Surgeon: [name], MD;  Location: [facility] ORS;  Service: General;  Laterality: Right;  . BREAST LUMPECTOMY Right 2018  . CESAREAN SECTION  2005 2015  . CHOLECYSTECTOMY  09/2012  . CYSTOSCOPY W/ RETROGRADES N/A 02/02/2017   Procedure: CYSTOSCOPY WITH RETROGRADE PYELOGRAM;  Surgeon: [name], MD;  Location: [facility];  Service: Urology;  Laterality: N/A;  . CYSTOSCOPY/URETEROSCOPY/HOLMIUM LASER/STENT PLACEMENT Left 07/10/2020   Procedure: CYSTOSCOPY/URETEROSCOPY/HOLMIUM LASER/STENT PLACEMENT;  Surgeon: [name], MD;  Location: [facility]  ORS;  Service: Urology;  Laterality: Left;  . EXTRACORPOREAL SHOCK WAVE LITHOTRIPSY  2009  . INTRAUTERINE DEVICE INSERTION  MARCH 2012   MURINA  . LAPAROSCOPIC GASTRIC SLEEVE RESECTION N/A 08/09/2019   Procedure: LAPAROSCOPIC GASTRIC SLEEVE RESECTION, Upper Endo, [facility] [place];  Surgeon: [name], MD;  Location: [facility];  Service: General;  Laterality: N/A;  . [place]  (APPROX)   Kidney Stone  . URETEROSCOPY WITH  HOLMIUM LASER LITHOTRIPSY Left 02/02/2017   Procedure: URETEROSCOPY WITH HOLMIUM LASER LITHOTRIPSY;  Surgeon: [name], MD;  Location: [facility] ORS;  Service: Urology;  Laterality: Left;    Family History  Problem Relation Age of Onset  . Lung cancer Father   . Other Mother        breast cyst  . Breast cancer Mother 57       tested  . Alcohol abuse Maternal Uncle   . Alcohol abuse Paternal Uncle   . Ovarian cancer Maternal Grandmother 76  . Lymphoma Maternal Grandfather        cancer  . Breast cancer Maternal Aunt [address], late 68s  . Alcohol abuse Paternal Grandfather     SOCIAL HX:  reports that she quit smoking about 6 years ago. Her smoking use included cigarettes. She has a 8.00 pack-year smoking history. She has never used smokeless tobacco. She reports previous alcohol use. She reports that she does not use drugs.   Current Outpatient Medications:  .  levonorgestrel (MIRENA) 20 MCG/24HR IUD, 1 each by Intrauterine route once., Disp: , Rfl:  .  Multiple Vitamins-Minerals (BARIATRIC FUSION) CHEW, Chew 1 each by mouth 2 (two) times daily., Disp: , Rfl:  .  raloxifene (EVISTA) 60 MG tablet, TAKE 1 TABLET(60 MG) BY MOUTH DAILY (Patient taking differently: Take 60 mg by mouth daily.), Disp: 30 tablet, Rfl: 12 .  buPROPion (WELLBUTRIN XL) 300 MG 24 hr tablet, Take 1 tablet (300 mg total) by mouth daily., Disp: 90 tablet, Rfl: 1 .  PARoxetine (PAXIL) 10 MG tablet, TAKE 1 TABLET(10 MG) BY MOUTH DAILY, Disp: 90 tablet, Rfl: 1  EXAM:  VITALS per patient if applicable:  GENERAL: alert, oriented, appears well and in no acute distress  HEENT: atraumatic, conjunttiva clear, no obvious abnormalities on inspection of external nose and ears  NECK: normal movements of the head and neck  LUNGS: on inspection no signs of respiratory distress, breathing rate appears normal, no obvious gross SOB, gasping or wheezing  CV: no obvious cyanosis  MS: moves all visible extremities  without noticeable abnormality  PSYCH/NEURO: pleasant and cooperative, no obvious depression or anxiety, speech and thought processing grossly intact  ASSESSMENT AND PLAN:  Discussed the following assessment and plan:  Nephrolithiasis  Anxiety and depression  Lobular carcinoma in situ (LCIS) of right breast  History of right breast cancer  S/P laparoscopic sleeve gastrectomy - Plan: VITAMIN D 25 Hydroxy (Vit-D Deficiency, Fractures), Comprehensive metabolic panel, CBC with Differential/Platelet  Hypertension, unspecified type  Obesity (BMI 30.0-34.9) - Plan: Lipid panel, TSH  Nephrolithiasis She has retained stones in left kidney per urology office note.  Advised her to suspend hctz as this may be contributing to the precipitation of stones.   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  She has two sons,  One with downs syndrome who is in day school and doing well;  Her other son is 46  And doing well. Continue wellbutrin 300 mg daily and paxil 10 mg daily   S/P laparoscopic sleeve gastrectomy With weight loss of 50 lbs since surgery in Dec 2020.  She has not had any labs done since 2020   Hypertension Resolved with loss of 20% of body weight.  hctz discontinued today   History of right breast cancer S/p lumpectomy .  Continue Evista,  Follow up with Byrnett.     Obesity (BMI 30.0-34.9) Improved with 50 lb weightt loss secondary to gastri sleeve Dec 2020     I discussed the assessment and treatment plan with the patient. The patient was provided an opportunity to ask questions and all were answered. The patient agreed with the plan and demonstrated an understanding of the instructions.   The patient was advised to call back or seek an in-person evaluation if the symptoms worsen or if the condition fails to improve as anticipated.   I spent 30 minutes dedicated to the care  of this patient on the date of this encounter to include pre-visit review of his medical history,  Face-to-face time with the patient , and post visit ordering of testing and therapeutics.    [name], MD

## 2020-11-05 NOTE — Assessment & Plan Note (Deleted)
Diagnosed in 2018 lobular carcinoma in situ left breast . S/p lumpectomy .  Continue Evista,  Follow up with Byrnett.

## 2020-11-05 NOTE — Assessment & Plan Note (Signed)
She has retained stones in left kidney per urology office note.  Advised her to suspend hctz as this may be contributing to the precipitation of stones.

## 2020-11-05 NOTE — Assessment & Plan Note (Signed)
Improved with 50 lb weightt loss secondary to [facility] sleeve Dec 2020

## 2020-11-05 NOTE — Assessment & Plan Note (Signed)
With weight loss of 50 lbs since surgery in Dec 2020.  She has not had any labs done since 2020

## 2020-11-05 NOTE — Assessment & Plan Note (Signed)
Resolved with loss of 20% of body weight.  hctz discontinued today

## 2020-11-05 NOTE — Assessment & Plan Note (Signed)
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  She has two sons,  One with downs syndrome who is in day school and doing well;  Her other son is 40  And doing well. Continue wellbutrin 300 mg daily and paxil 10 mg daily

## 2020-11-05 NOTE — Patient Instructions (Signed)
HCTZ is a blood pressure medication that is a diuretic: it works by getting rid  of excess fluid.  It MAY be increasing your risk for more kidney stones  Since your blood pressure is so much better, Please suspend  The hctz  for one week  Get your BP checked at work and follow your weights.  Send me an update in one week (bp and any weight /fluid gain?)  We can choose a different diuretic if you have regained fluid , depending on your blood pressure reading (120/70 or less )

## 2020-11-19 ENCOUNTER — Other Ambulatory Visit: Payer: Self-pay | Admitting: Internal Medicine

## 2021-01-04 ENCOUNTER — Other Ambulatory Visit: Payer: Self-pay | Admitting: Family Medicine

## 2021-01-04 DIAGNOSIS — M5416 Radiculopathy, lumbar region: Secondary | ICD-10-CM

## 2021-01-15 ENCOUNTER — Ambulatory Visit: Payer: BC Managed Care – PPO

## 2021-01-23 ENCOUNTER — Ambulatory Visit: Payer: BC Managed Care – PPO | Admitting: Podiatry

## 2021-01-30 ENCOUNTER — Ambulatory Visit: Payer: BC Managed Care – PPO | Admitting: Podiatry

## 2021-01-30 ENCOUNTER — Other Ambulatory Visit: Payer: Self-pay

## 2021-01-30 ENCOUNTER — Ambulatory Visit (INDEPENDENT_AMBULATORY_CARE_PROVIDER_SITE_OTHER): Payer: BC Managed Care – PPO

## 2021-01-30 VITALS — BP 102/64 | HR 86 | Temp 98.9°F | Resp 16

## 2021-01-30 DIAGNOSIS — M722 Plantar fascial fibromatosis: Secondary | ICD-10-CM

## 2021-01-30 MED ORDER — MELOXICAM 15 MG PO TABS: 15.0000 mg | ORAL_TABLET | Freq: Every day | ORAL | 3 refills | Status: DC

## 2021-01-30 MED ORDER — METHYLPREDNISOLONE 4 MG PO TBPK: ORAL_TABLET | ORAL | 0 refills | Status: DC

## 2021-01-30 MED ORDER — TRIAMCINOLONE ACETONIDE 40 MG/ML IJ SUSP
20.0000 mg | Freq: Once | INTRAMUSCULAR | Status: AC
  Administered 2021-01-30: 20 mg

## 2021-01-30 NOTE — Patient Instructions (Signed)

## 2021-01-30 NOTE — Progress Notes (Signed)
Subjective:  Patient ID: Ariana Weeks, female    DOB: 07-13-1971,  MRN: [id number] HPI Chief Complaint  Patient presents with  . Foot Pain    "It's my right heel.  It's a sharp pain.  I went to Dr. Jens Som.  He said it was Plantar Fasciitis and a heel spur.  I just want a second opinion."    50 y.o. female presents with the above complaint.   ROS: Denies fever chills nausea vomiting muscle aches pains calf pain back pain chest pain shortness of breath.  Past Medical History:  Diagnosis Date  . Anxiety   . BRCA negative 02/2019   MyRisk neg except AXIN2 VUS  . Depression   . Head cold   . History of frequent urinary tract infections   . History of kidney stones   . Hypertension    before gastric bypass  . Increased risk of breast cancer 02/2019   IBIS=66% due to hx of lobular CIS  . Kidney stones   . Left ureteral calculus   . 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 [name]  . Sleep apnea    no- c-pap  . Supervision of high risk pregnancy, antepartum 05/18/2014   Formatting of this note might be different from the original. Case discussed at Peds/Perinatal multidisciplinary conf 05/18/2014. Aortic atresia/mitral stenosis Delivery plan: repeat c-section at 39 weeks   Past Surgical History:  Procedure Laterality Date  . BREAST BIOPSY Right 02/02/2013   [place] bx/clip-neg  . BREAST BIOPSY Right 02/19/2017   affirm, ATYPICAL LOBULAR HYPERPLASIA  . BREAST BIOPSY Right 03/04/2017   Procedure: BREAST BIOPSY WITH NEEDLE LOCALIZATION;  Surgeon: [name], MD;  Location: [facility] ORS;  Service: General;  Laterality: Right;  . BREAST LUMPECTOMY Right 2018  . CESAREAN SECTION  2005 2015  . CHOLECYSTECTOMY  09/2012  . CYSTOSCOPY W/ RETROGRADES N/A 02/02/2017   Procedure: CYSTOSCOPY WITH RETROGRADE PYELOGRAM;  Surgeon: [name], MD;  Location: [facility] ORS;  Service: Urology;  Laterality: N/A;  . CYSTOSCOPY/URETEROSCOPY/HOLMIUM LASER/STENT PLACEMENT Left 07/10/2020    Procedure: CYSTOSCOPY/URETEROSCOPY/HOLMIUM LASER/STENT PLACEMENT;  Surgeon: [name], MD;  Location: [facility] ORS;  Service: Urology;  Laterality: Left;  . EXTRACORPOREAL SHOCK WAVE LITHOTRIPSY  2009  . INTRAUTERINE DEVICE INSERTION  MARCH 2012   MURINA  . LAPAROSCOPIC GASTRIC SLEEVE RESECTION N/A 08/09/2019   Procedure: LAPAROSCOPIC GASTRIC SLEEVE RESECTION, Upper Endo, ER[facility] [place];  Surgeon: [name], MD;  Location: [facility];  Service: General;  Laterality: N/A;  . [place]  (APPROX)   Kidney Stone  . URETEROSCOPY WITH HOLMIUM LASER LITHOTRIPSY Left 02/02/2017   Procedure: URETEROSCOPY WITH HOLMIUM LASER LITHOTRIPSY;  Surgeon: [name], MD;  Location: [facility] ORS;  Service: Urology;  Laterality: Left;    Current Outpatient Medications:  .  meloxicam (MOBIC) 15 MG tablet, Take 1 tablet (15 mg total) by mouth daily., Disp: 30 tablet, Rfl: 3 .  methylPREDNISolone (MEDROL DOSEPAK) 4 MG TBPK tablet, 6 day dose pack - take as directed, Disp: 21 tablet, Rfl: 0 .  buPROPion (WELLBUTRIN XL) 300 MG 24 hr tablet, TAKE 1 TABLET(300 MG) BY MOUTH DAILY, Disp: 30 tablet, Rfl: 1 .  levonorgestrel (MIRENA) 20 MCG/24HR IUD, 1 each by Intrauterine route once., Disp: , Rfl:  .  Multiple Vitamins-Minerals (BARIATRIC FUSION) CHEW, Chew 1 each by mouth 2 (two) times daily., Disp: , Rfl:  .  PARoxetine (PAXIL) 10 MG tablet, TAKE 1 TABLET(10 MG) BY  MOUTH DAILY, Disp: 90 tablet, Rfl: 1 .  raloxifene (EVISTA) 60 MG tablet, TAKE 1 TABLET(60 MG) BY MOUTH DAILY (Patient taking differently: Take 60 mg by mouth daily.), Disp: 30 tablet, Rfl: 12  Allergies  Allergen Reactions  . Dilaudid [Hydromorphone Hcl] Itching    Red face and itching  . Azithromycin     Other reaction(s): Unknown  . Nitrofurantoin Hives    Macrobid  . Morphine And Related Hives and Rash   Review of Systems Objective:   Vitals:   01/30/21 1525  BP: 102/64  Pulse: 86  Resp: 16  Temp: 98.9 F (37.2 C)     General: Well developed, nourished, in no acute distress, alert and oriented x3   Dermatological: Skin is warm, dry and supple bilateral. Nails x 10 are well maintained; remaining integument appears unremarkable at this time. There are no open sores, no preulcerative lesions, no rash or signs of infection present.  Vascular: Dorsalis Pedis artery and Posterior Tibial artery pedal pulses are 2/4 bilateral with immedate capillary fill time. Pedal hair growth present. No varicosities and no lower extremity edema present bilateral.   Neruologic: Grossly intact via light touch bilateral. Vibratory intact via tuning fork bilateral. Protective threshold with Semmes Wienstein monofilament intact to all pedal sites bilateral. Patellar and Achilles deep tendon reflexes 2+ bilateral. No Babinski or clonus noted bilateral.   Musculoskeletal: No gross boney pedal deformities bilateral. No pain, crepitus, or limitation noted with foot and ankle range of motion bilateral. Muscular strength 5/5 in all groups tested bilateral.  Pain on palpation medial calcaneal tubercle of the right heel.  No pain on medial lateral compression of the calcaneus.  Gait: Unassisted, Nonantalgic.    Radiographs:  Radiographs taken today demonstrate plantar distally [place]aine heel spur soft tissue increase in density plantar fascial cannula surgical site consistent with Planter fasciitis.  Assessment & Plan:   Assessment: Proximal Planter fasciitis  Plan: Discussed etiology pathology conservative versus surgical therapies at this point I injected her with 20 maximal tenderness 20 mg Kenalog 5 mg Marcaine point maximal tenderness.  Tolerated procedure well.  Also started her on Medrol Dosepak to be followed by meloxicam.  I also placed her in plantar fascial brace and a night splint discussed appropriate shoe gear stretching exercises ice therapy and shoe gear modifications.     Nabilah Davoli T. [place], [place]

## 2021-02-27 ENCOUNTER — Ambulatory Visit: Payer: BC Managed Care – PPO | Admitting: Podiatry

## 2021-03-05 ENCOUNTER — Other Ambulatory Visit: Payer: Self-pay | Admitting: General Surgery

## 2021-03-05 DIAGNOSIS — D0501 Lobular carcinoma in situ of right breast: Secondary | ICD-10-CM

## 2021-03-05 DIAGNOSIS — R920 Mammographic microcalcification found on diagnostic imaging of breast: Secondary | ICD-10-CM

## 2021-03-08 ENCOUNTER — Encounter (HOSPITAL_COMMUNITY): Payer: Self-pay | Admitting: *Deleted

## 2021-03-27 ENCOUNTER — Other Ambulatory Visit: Payer: Self-pay

## 2021-03-27 ENCOUNTER — Encounter: Payer: Self-pay | Admitting: Podiatry

## 2021-03-27 ENCOUNTER — Ambulatory Visit: Payer: BC Managed Care – PPO | Admitting: Podiatry

## 2021-03-27 DIAGNOSIS — M722 Plantar fascial fibromatosis: Secondary | ICD-10-CM | POA: Diagnosis not present

## 2021-03-27 MED ORDER — TRIAMCINOLONE ACETONIDE 40 MG/ML IJ SUSP
20.0000 mg | Freq: Once | INTRAMUSCULAR | Status: AC
  Administered 2021-03-27: 20 mg

## 2021-03-27 MED ORDER — METHYLPREDNISOLONE 4 MG PO TBPK: ORAL_TABLET | ORAL | 0 refills | Status: DC

## 2021-03-27 MED ORDER — MELOXICAM 15 MG PO TABS: 15.0000 mg | ORAL_TABLET | Freq: Every day | ORAL | 3 refills | Status: DC

## 2021-03-27 NOTE — Progress Notes (Signed)
She presents today for follow-up of her Planter fasciitis of her right foot.  States that it was really doing well until just the other week and it started to act up and she states now it is more painful than it was prior to the initial injection.  Objective: Vital signs are stable she is alert and oriented x3 pulses are palpable.  No neurological changes.  She has moderate to severe pain on palpation medial calcaneal tubercle.  Assessment: Chronic intractable Planter fasciitis right.  Plan: At this point we started deliverer with the methylprednisolone and meloxicam plantar fascial brace and I reinjected the heel with 20 mg Kenalog 5 mg Marcaine after sterile Betadine skin prep.  Tolerated procedure well without complications none like to follow-up with her in 1 month.  If not improved we will consider MRI.

## 2021-04-08 ENCOUNTER — Ambulatory Visit
Admission: RE | Admit: 2021-04-08 | Discharge: 2021-04-08 | Disposition: A | Discharge status: Home / Self Care | Payer: BC Managed Care – PPO | Source: Ambulatory Visit | Attending: General Surgery | Admitting: General Surgery

## 2021-04-08 ENCOUNTER — Other Ambulatory Visit: Payer: Self-pay

## 2021-04-08 DIAGNOSIS — D0501 Lobular carcinoma in situ of right breast: Secondary | ICD-10-CM | POA: Diagnosis not present

## 2021-05-01 ENCOUNTER — Ambulatory Visit: Payer: BC Managed Care – PPO | Admitting: Podiatry

## 2021-05-25 ENCOUNTER — Other Ambulatory Visit: Payer: Self-pay | Admitting: Podiatry

## 2021-06-03 ENCOUNTER — Other Ambulatory Visit: Payer: Self-pay | Admitting: Internal Medicine

## 2021-06-04 ENCOUNTER — Other Ambulatory Visit: Payer: Self-pay | Admitting: Internal Medicine

## 2021-06-17 ENCOUNTER — Encounter: Payer: BC Managed Care – PPO | Admitting: Podiatry

## 2021-06-17 NOTE — Progress Notes (Signed)
This encounter was created in error - please disregard.

## 2021-07-08 ENCOUNTER — Ambulatory Visit: Payer: BC Managed Care – PPO | Admitting: Internal Medicine

## 2021-07-26 NOTE — Telephone Encounter (Signed)
Mirena not rcvd

## 2021-07-30 ENCOUNTER — Telehealth (INDEPENDENT_AMBULATORY_CARE_PROVIDER_SITE_OTHER): Payer: BC Managed Care – PPO | Admitting: Internal Medicine

## 2021-07-30 ENCOUNTER — Encounter: Payer: Self-pay | Admitting: Internal Medicine

## 2021-07-30 DIAGNOSIS — F32A Depression, unspecified: Secondary | ICD-10-CM | POA: Diagnosis not present

## 2021-07-30 DIAGNOSIS — I10 Essential (primary) hypertension: Secondary | ICD-10-CM

## 2021-07-30 DIAGNOSIS — F419 Anxiety disorder, unspecified: Secondary | ICD-10-CM

## 2021-07-30 MED ORDER — PAROXETINE HCL 10 MG PO TABS
ORAL_TABLET | ORAL | 1 refills | Status: DC
Start: 2021-07-30 — End: 2021-11-07

## 2021-07-30 MED ORDER — OSELTAMIVIR PHOSPHATE 75 MG PO CAPS: 75.0000 mg | ORAL_CAPSULE | Freq: Every day | ORAL | 0 refills | Status: DC

## 2021-07-30 MED ORDER — HYDROCHLOROTHIAZIDE 25 MG PO TABS: ORAL_TABLET | ORAL | 1 refills | Status: DC

## 2021-07-30 MED ORDER — BUPROPION HCL ER (XL) 300 MG PO TB24: ORAL_TABLET | ORAL | 1 refills | Status: DC

## 2021-07-30 NOTE — Progress Notes (Signed)
Virtual Visit via [place] Note  This visit type was conducted due to national recommendations for restrictions regarding the COVID-19 pandemic (e.g. social distancing).  This format is felt to be most appropriate for this patient at this time.  All issues noted in this document were discussed and addressed.  No physical exam was performed (except for noted visual exam findings with Video Visits).   I connected withNAME@ on 07/30/21 at  4:00 PM EST by a video enabled telemedicine application  and verified that I am speaking with the correct person using two identifiers. Location patient: home Location provider: work or home office Persons participating in the virtual visit: patient, provider  I discussed the limitations, risks, security and privacy concerns of performing an evaluation and management service by telephone and the availability of in person appointments. I also discussed with the patient that there may be a patient responsible charge related to this service. The patient expressed understanding and agreed to proceed.   Reason for visit: follow up on depression/anxiety and hypertension   HPI:  50 yr old female presents for follow up,  visit was converted to video due to son Lawson's recent diagnosis of influenza A illness   Depresison/anxiety;  functioning  well on wellbutrin and paxil.  Does not want to change meds  HTN:  Hypertension: patient checks blood pressure twice weekly at home.  Readings have been for the most part 140/80 at rest . Patient is following a reduce salt diet most days and is taking medications as prescribed  (HCT) without side effects    ROS: See pertinent positives and negatives per HPI.  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 [name]   Sleep apnea    no- c-pap   Supervision of high risk pregnancy, antepartum 05/18/2014   Formatting of this note might be different from the original. Case discussed at Peds/Perinatal multidisciplinary conf 05/18/2014. Aortic atresia/mitral stenosis Delivery plan: repeat c-section at 39 weeks    Past Surgical History:  Procedure Laterality Date   BREAST BIOPSY Right 02/02/2013   [place] bx/clip-neg   BREAST BIOPSY Right 02/19/2017   affirm, ATYPICAL LOBULAR HYPERPLASIA   BREAST BIOPSY Right 03/04/2017   Procedure: BREAST BIOPSY WITH NEEDLE LOCALIZATION;  Surgeon: [name], MD;  Location: [facility];  Service: General;  Laterality: Right;   BREAST LUMPECTOMY Right 2018   CESAREAN SECTION  2005 2015   CHOLECYSTECTOMY  09/2012   CYSTOSCOPY W/ RETROGRADES N/A 02/02/2017   Procedure: CYSTOSCOPY WITH RETROGRADE PYELOGRAM;  Surgeon: [name], MD;  Location: [facility];  Service: Urology;  Laterality: N/A;   CYSTOSCOPY/URETEROSCOPY/HOLMIUM LASER/STENT PLACEMENT Left 07/10/2020   Procedure: CYSTOSCOPY/URETEROSCOPY/HOLMIUM LASER/STENT PLACEMENT;  Surgeon: [name], MD;  Location: [facility];  Service: Urology;  Laterality: Left;   EXTRACORPOREAL SHOCK WAVE LITHOTRIPSY  2009   INTRAUTERINE DEVICE INSERTION  MARCH 2012   MURINA   LAPAROSCOPIC GASTRIC SLEEVE RESECTION N/A 08/09/2019   Procedure: LAPAROSCOPIC GASTRIC SLEEVE RESECTION,  Upper Endo, [facility] [place];  Surgeon: [name], MD;  Location: [facility];  Service: General;  Laterality: N/A;   [place]  (APPROX)   Kidney Stone   URETEROSCOPY WITH HOLMIUM LASER LITHOTRIPSY Left 02/02/2017   Procedure: URETEROSCOPY WITH HOLMIUM LASER LITHOTRIPSY;  Surgeon: [name], MD;  Location: [facility];  Service: Urology;  Laterality: Left;    Family  History  Problem Relation Age of Onset   Lung cancer Father    Other Mother        breast cyst   Breast cancer Mother 31       tested   Alcohol abuse Maternal Uncle    Alcohol abuse Paternal Uncle    Ovarian cancer Maternal Grandmother 76   Lymphoma Maternal Grandfather        cancer   Breast cancer Maternal Aunt 70       Great Aunt, late 1s   Alcohol abuse Paternal Grandfather     SOCIAL HX: reports that she quit smoking about 7 years ago. Her smoking use included cigarettes. She has a 8.00 pack-year smoking history. She has never used smokeless tobacco. She reports that she does not currently use alcohol. She reports that she does not use drugs.    Current Outpatient Medications:    levonorgestrel (MIRENA) 20 MCG/24HR IUD, 1 each by Intrauterine route once., Disp: , Rfl:    metroNIDAZOLE (METROCREAM) 0.75 % cream, Apply topically 2 (two) times daily., Disp: , Rfl:    oseltamivir (TAMIFLU) 75 MG capsule, Take 1 capsule (75 mg total) by mouth daily. FOR INFLUENZA PROPHYLAXIS, Disp: 10 capsule, Rfl: 0   raloxifene (EVISTA) 60 MG tablet, TAKE 1 TABLET(60 MG) BY MOUTH DAILY (Patient taking differently: Take 60 mg by mouth daily.), Disp: 30 tablet, Rfl: 12   buPROPion (WELLBUTRIN XL) 300 MG 24 hr tablet, TAKE 1 TABLET(300 MG) BY MOUTH DAILY, Disp: 90 tablet, Rfl: 1   gabapentin (NEURONTIN) 300 MG capsule, Take 300 mg by mouth at bedtime. (Patient not taking: Reported on 07/30/2021), Disp: , Rfl:    hydrochlorothiazide (HYDRODIURIL) 25 MG tablet, TAKE 1 TABLET(25 MG) BY MOUTH DAILY, Disp: 90 tablet, Rfl: 1   meloxicam (MOBIC) 15 MG tablet, TAKE 1 TABLET(15 MG) BY MOUTH DAILY (Patient not taking: Reported on 07/30/2021), Disp: 30 tablet, Rfl: 3   Multiple Vitamins-Minerals (BARIATRIC FUSION) CHEW, Chew 1 each by mouth 2 (two) times daily. (Patient not taking: Reported on 07/30/2021), Disp: , Rfl:    PARoxetine (PAXIL) 10 MG tablet, TAKE 1 TABLET(10 MG) BY MOUTH DAILY, Disp: 90 tablet, Rfl:  1  EXAM:  VITALS per patient if applicable:  GENERAL: alert, oriented, appears well and in no acute distress  HEENT: atraumatic, conjunttiva clear, no obvious abnormalities on inspection of external nose and ears  NECK: normal movements of the head and neck  LUNGS: on inspection no signs of respiratory distress, breathing rate appears normal, no obvious gross SOB, gasping or wheezing  CV: no obvious cyanosis  MS: moves all visible extremities without noticeable abnormality  PSYCH/NEURO: pleasant and cooperative, no obvious depression or anxiety, speech and thought processing grossly intact  ASSESSMENT AND PLAN:  Discussed the following assessment and plan:  Anxiety and depression  Hypertension, unspecified type  Anxiety and depression Symptoms have improved with life changes and medication, 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  She has two sons,  One with downs syndrome who is in day school and currently sick with  influenza but in good spirits. ;  Her other son is 52  And doing well. Continue wellbutrin 300 mg daily and paxil 10 mg daily .   Hypertension She has rresumed hctz and will return for labs next week     I discussed the assessment and treatment plan with the patient. The patient was provided an opportunity to ask questions and all were answered. The patient agreed with the plan and demonstrated an understanding of the instructions.   The patient was advised to call back or seek an in-person evaluation if the symptoms worsen or if the condition fails to improve as anticipated.   I spent 20 minutes dedicated to the care of this patient on the date of this encounter to include pre-visit review of his medical history,  Face-to-face time with the patient , and post visit ordering of testing and therapeutics.    [name], MD

## 2021-07-30 NOTE — Patient Instructions (Signed)
PLEASE CALL THE OFFICE TO SCHEDULE A LAB APPOINTMENT ASAP   I HAVE REFILLED WELLBUTRIN PAXIL AND HCTZ.  I RECOMMEND TAKING TAMIFLU FOR THE NEXT 10 DAYS TO PREVENT YOU FROM GETTING THE FLU FROM [facility]:  TAKE IT ONCE DAILY  FOR TEN DAYS  .Marland Kitchen  CONVERT TO TWICE DAILY IF YOU DEVELOP THE FLU

## 2021-07-30 NOTE — Assessment & Plan Note (Signed)
She has rresumed hctz and will return for labs next week

## 2021-07-30 NOTE — Assessment & Plan Note (Addendum)
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  She has two sons,  One with downs syndrome who is in day school and currently sick with influenza but in good spirits. ;  Her other son is 88  And doing well. Continue wellbutrin 300 mg daily and paxil 10 mg daily .

## 2021-08-22 ENCOUNTER — Encounter: Payer: Self-pay | Admitting: Urology

## 2021-08-22 ENCOUNTER — Ambulatory Visit: Payer: BC Managed Care – PPO | Admitting: Urology

## 2021-08-27 ENCOUNTER — Telehealth: Payer: Self-pay | Admitting: Internal Medicine

## 2021-08-27 NOTE — Telephone Encounter (Signed)
Pt is calling in regards to raloxifene (EVISTA) 60 MG tablet Pt is scheduled to be seen with surgeon for breast issues Jan 24th. Due to pt not seeing provider in a year that she needs to have it refilled by PCP. Pt is completely out of medication and doesn't want to stop it. Pt is wanting to know if her prescription can be refilled at least up until her appt with surgeon. Pt is also scheduled for lab work 1/6 at 3:30. Pt uses [facility] on [address]

## 2021-08-28 MED ORDER — RALOXIFENE HCL 60 MG PO TABS: 60.0000 mg | ORAL_TABLET | Freq: Every day | ORAL | 0 refills | Status: DC

## 2021-08-28 NOTE — Telephone Encounter (Signed)
Spoke with pt to let her know that the medication has been refilled for 30 days.

## 2021-08-30 ENCOUNTER — Other Ambulatory Visit (INDEPENDENT_AMBULATORY_CARE_PROVIDER_SITE_OTHER): Payer: BC Managed Care – PPO

## 2021-08-30 ENCOUNTER — Other Ambulatory Visit: Payer: Self-pay

## 2021-08-30 DIAGNOSIS — Z9884 Bariatric surgery status: Secondary | ICD-10-CM

## 2021-08-30 DIAGNOSIS — E669 Obesity, unspecified: Secondary | ICD-10-CM | POA: Diagnosis not present

## 2021-08-30 DIAGNOSIS — E559 Vitamin D deficiency, unspecified: Secondary | ICD-10-CM

## 2021-08-30 NOTE — Addendum Note (Signed)
Addended by: Leeanne Rio on: 08/30/2021 03:34 PM   Modules accepted: Orders

## 2021-08-30 NOTE — Addendum Note (Signed)
Addended by: Ariel Dimitri S on: 08/30/2021 01:20 PM ° ° Modules accepted: Orders ° °

## 2021-08-30 NOTE — Addendum Note (Signed)
Addended by: Jevonte Clanton S on: 08/30/2021 01:20 PM ° ° Modules accepted: Orders ° °

## 2021-08-30 NOTE — Addendum Note (Signed)
Addended by: Zamarian Scarano S on: 08/30/2021 01:20 PM ° ° Modules accepted: Orders ° °

## 2021-08-30 NOTE — Addendum Note (Signed)
Addended by: Neta Ehlers on: 08/30/2021 01:20 PM   Modules accepted: Orders

## 2021-08-30 NOTE — Addendum Note (Signed)
Addended by: Lanaya Bennis S on: 08/30/2021 01:20 PM ° ° Modules accepted: Orders ° °

## 2021-08-31 LAB — CBC WITH DIFFERENTIAL/PLATELET
Basophils Absolute: 0 10*3/uL (ref 0.0–0.2)
Basos: 0 %
EOS (ABSOLUTE): 0.1 10*3/uL (ref 0.0–0.4)
Eos: 1 %
Hematocrit: 40.4 % (ref 34.0–46.6)
Hemoglobin: 13.8 g/dL (ref 11.1–15.9)
Immature Grans (Abs): 0 10*3/uL (ref 0.0–0.1)
Immature Granulocytes: 1 %
Lymphocytes Absolute: 1.9 10*3/uL (ref 0.7–3.1)
Lymphs: 27 %
MCH: 30.3 pg (ref 26.6–33.0)
MCHC: 34.2 g/dL (ref 31.5–35.7)
MCV: 89 fL (ref 79–97)
Monocytes Absolute: 0.7 10*3/uL (ref 0.1–0.9)
Monocytes: 10 %
Neutrophils Absolute: 4.2 10*3/uL (ref 1.4–7.0)
Neutrophils: 61 %
Platelets: 259 10*3/uL (ref 150–450)
RBC: 4.56 x10E6/uL (ref 3.77–5.28)
RDW: 13.3 % (ref 11.7–15.4)
WBC: 6.8 10*3/uL (ref 3.4–10.8)

## 2021-08-31 LAB — VITAMIN D 25 HYDROXY (VIT D DEFICIENCY, FRACTURES): Vit D, 25-Hydroxy: 20.4 ng/mL — ABNORMAL LOW (ref 30.0–100.0)

## 2021-08-31 LAB — COMPREHENSIVE METABOLIC PANEL
ALT: 16 IU/L (ref 0–32)
AST: 16 IU/L (ref 0–40)
Albumin/Globulin Ratio: 1.7 (ref 1.2–2.2)
Albumin: 4.1 g/dL (ref 3.8–4.8)
Alkaline Phosphatase: 63 IU/L (ref 44–121)
BUN/Creatinine Ratio: 25 — ABNORMAL HIGH (ref 9–23)
BUN: 20 mg/dL (ref 6–24)
Bilirubin Total: 0.3 mg/dL (ref 0.0–1.2)
CO2: 27 mmol/L (ref 20–29)
Calcium: 9.4 mg/dL (ref 8.7–10.2)
Chloride: 101 mmol/L (ref 96–106)
Creatinine, Ser: 0.81 mg/dL (ref 0.57–1.00)
Globulin, Total: 2.4 g/dL (ref 1.5–4.5)
Glucose: 72 mg/dL (ref 70–99)
Potassium: 3.7 mmol/L (ref 3.5–5.2)
Sodium: 140 mmol/L (ref 134–144)
Total Protein: 6.5 g/dL (ref 6.0–8.5)
eGFR: 89 mL/min/{1.73_m2} (ref >59)

## 2021-08-31 LAB — LIPID PANEL
Chol/HDL Ratio: 4.4 ratio (ref 0.0–4.4)
Cholesterol, Total: 200 mg/dL — ABNORMAL HIGH (ref 100–199)
HDL: 45 mg/dL (ref >39)
LDL Chol Calc (NIH): 128 mg/dL — ABNORMAL HIGH (ref 0–99)
Triglycerides: 154 mg/dL — ABNORMAL HIGH (ref 0–149)
VLDL Cholesterol Cal: 27 mg/dL (ref 5–40)

## 2021-08-31 LAB — TSH: TSH: 1.95 u[IU]/mL (ref 0.450–4.500)

## 2021-09-01 DIAGNOSIS — E559 Vitamin D deficiency, unspecified: Secondary | ICD-10-CM | POA: Insufficient documentation

## 2021-09-01 MED ORDER — ERGOCALCIFEROL 1.25 MG (50000 UT) PO CAPS: 50000.0000 [IU] | ORAL_CAPSULE | ORAL | 0 refills | Status: DC

## 2021-09-01 NOTE — Addendum Note (Signed)
Addended by: Sherlene Shams on: 09/01/2021 04:38 PM   Modules accepted: Orders

## 2021-09-16 ENCOUNTER — Telehealth: Payer: Self-pay | Admitting: Internal Medicine

## 2021-09-16 NOTE — Telephone Encounter (Signed)
Pt called in stating she had lab work done a few weeks ago. Pt stated that she never received her lab results. Pt stated that medication was sent to pharmacy but no one had called her about lab results. Pt requesting callback.

## 2021-09-17 NOTE — Telephone Encounter (Signed)
Spoke with pt and informed her of her lab results. Pt gave a verbal understanding.  

## 2021-09-25 ENCOUNTER — Other Ambulatory Visit: Payer: Self-pay | Admitting: Internal Medicine

## 2021-09-30 ENCOUNTER — Ambulatory Visit: Payer: BC Managed Care – PPO | Admitting: Obstetrics and Gynecology

## 2021-10-27 ENCOUNTER — Other Ambulatory Visit: Payer: Self-pay | Admitting: Internal Medicine

## 2021-11-07 ENCOUNTER — Other Ambulatory Visit: Payer: Self-pay

## 2021-11-07 ENCOUNTER — Ambulatory Visit: Payer: BC Managed Care – PPO | Admitting: Obstetrics and Gynecology

## 2021-11-07 MED ORDER — PAROXETINE HCL 10 MG PO TABS: ORAL_TABLET | ORAL | 0 refills | Status: DC

## 2021-11-29 ENCOUNTER — Other Ambulatory Visit: Payer: Self-pay | Admitting: Internal Medicine

## 2021-12-06 ENCOUNTER — Other Ambulatory Visit: Payer: Self-pay | Admitting: Internal Medicine

## 2021-12-11 ENCOUNTER — Encounter: Payer: Self-pay | Admitting: Obstetrics and Gynecology

## 2021-12-11 ENCOUNTER — Ambulatory Visit (INDEPENDENT_AMBULATORY_CARE_PROVIDER_SITE_OTHER): Payer: BC Managed Care – PPO | Admitting: Obstetrics and Gynecology

## 2021-12-11 VITALS — BP 120/70 | Ht 62.0 in | Wt 191.0 lb

## 2021-12-11 DIAGNOSIS — Z01419 Encounter for gynecological examination (general) (routine) without abnormal findings: Secondary | ICD-10-CM

## 2021-12-11 DIAGNOSIS — D0501 Lobular carcinoma in situ of right breast: Secondary | ICD-10-CM

## 2021-12-11 DIAGNOSIS — Z30431 Encounter for routine checking of intrauterine contraceptive device: Secondary | ICD-10-CM

## 2021-12-11 DIAGNOSIS — Z1211 Encounter for screening for malignant neoplasm of colon: Secondary | ICD-10-CM

## 2021-12-11 DIAGNOSIS — Z1231 Encounter for screening mammogram for malignant neoplasm of breast: Secondary | ICD-10-CM | POA: Diagnosis not present

## 2021-12-11 DIAGNOSIS — Z9189 Other specified personal risk factors, not elsewhere classified: Secondary | ICD-10-CM

## 2021-12-11 DIAGNOSIS — Z803 Family history of malignant neoplasm of breast: Secondary | ICD-10-CM

## 2021-12-11 NOTE — Progress Notes (Signed)
? ?PCP:  [name], MD ? ? ?Chief Complaint  ?Patient presents with  ? Gynecologic Exam  ?  No concerns  ? ? ? ?HPI: ?     [name] is a 51 y.o. [id number] who LMP was No LMP recorded. (Menstrual status: IUD)., presents today for her annual examination.  Her menses are absent with IUD.  Dysmenorrhea none. She does not have intermenstrual bleeding. No vasomotor sx. ? ?Sex activity: single partner, contraception - IUD. Mirena placed 09/25/14. Has 8 yr indication. No pain/dryness ?Last Pap: 09/26/20  Results were: no abnormalities /neg HPV DNA  ?Hx of STDs: none ? ?Last mammogram: 04/08/21 Results were normal; Hx of atypical lobular hyperplasia and lobular CIS with Dr. [name] . Pt doing evista for 5 yrs--on year 3 (couldn't take tamoxifen with her mood meds); sees DR. Byrnett Q6 months ? ?There is a FH of breast cancer in her mom (lobular CIS). There is a FH of ovarian cancer in her MGM, Pt is MyRisk neg except AXIN2 VUS 2020; IBIS=66% due to hx of lobular CIS. The patient does not do self-breast exams.  ? ?Tobacco use: The patient denies current or previous tobacco use. ?Alcohol use: none ?No drug use.  ?Exercise: min active ? ?Colonoscopy: never; wants to do with Dr. [name] ? ?She does not get adequate calcium but does get Vitamin D in her diet. ? ?Labs with PCP.  ? ? ?Past Medical History:  ?Diagnosis Date  ? Anxiety   ? BRCA negative 02/2019  ? MyRisk neg except AXIN2 VUS  ? Depression   ? Head cold   ? History of frequent urinary tract infections   ? History of kidney stones   ? Hypertension   ? before gastric bypass  ? Increased risk of breast cancer 02/2019  ? IBIS=66% due to hx of lobular CIS  ? Kidney stones   ? Left ureteral calculus   ? Lobular carcinoma in situ 2018  ? RIGHT;     Breast Dr. [name]  ? Sleep apnea   ? no- c-pap  ? Supervision of high risk pregnancy, antepartum 05/18/2014  ? Formatting of this note might be different from the original. Case discussed at Peds/Perinatal  multidisciplinary conf 05/18/2014. Aortic atresia/mitral stenosis Delivery plan: repeat c-section at 39 weeks  ? ? ?Past Surgical History:  ?Procedure Laterality Date  ? BREAST BIOPSY Right 02/02/2013  ? [place] bx/clip-neg  ? BREAST BIOPSY Right 02/19/2017  ? affirm, ATYPICAL LOBULAR HYPERPLASIA  ? BREAST BIOPSY Right 03/04/2017  ? Procedure: BREAST BIOPSY WITH NEEDLE LOCALIZATION;  Surgeon: [name], MD;  Location: [facility];  Service: General;  Laterality: Right;  ? BREAST LUMPECTOMY Right 2018  ? CESAREAN SECTION  2005 2015  ? CHOLECYSTECTOMY  09/2012  ? CYSTOSCOPY W/ RETROGRADES N/A 02/02/2017  ? Procedure: CYSTOSCOPY WITH RETROGRADE PYELOGRAM;  Surgeon: [name], MD;  Location: [facility];  Service: Urology;  Laterality: N/A;  ? CYSTOSCOPY/URETEROSCOPY/HOLMIUM LASER/STENT PLACEMENT Left 07/10/2020  ? Procedure: CYSTOSCOPY/URETEROSCOPY/HOLMIUM LASER/STENT PLACEMENT;  Surgeon: [name], MD;  Location: [facility];  Service: Urology;  Laterality: Left;  ? EXTRACORPOREAL SHOCK WAVE LITHOTRIPSY  2009  ? INTRAUTERINE DEVICE INSERTION  MARCH 2012  ? MURINA  ? LAPAROSCOPIC GASTRIC SLEEVE RESECTION N/A 08/09/2019  ? Procedure: LAPAROSCOPIC GASTRIC SLEEVE RESECTION, Upper Endo, [facility] [place];  Surgeon: [name], MD;  Location: [facility];  Service: General;  Laterality: N/A;  ? [place]  (APPROX)  ? Kidney Stone  ?  URETEROSCOPY WITH HOLMIUM LASER LITHOTRIPSY Left 02/02/2017  ? Procedure: URETEROSCOPY WITH HOLMIUM LASER LITHOTRIPSY;  Surgeon: [name], MD;  Location: [facility];  Service: Urology;  Laterality: Left;  ? ? ?Family History  ?Problem Relation Age of Onset  ? Lung cancer Father   ? Other Mother   ?     breast cyst  ? Breast cancer Mother 33  ?     tested  ? Alcohol abuse Maternal Uncle   ? Alcohol abuse Paternal Uncle   ? Ovarian cancer Maternal Grandmother 37  ? Lymphoma Maternal Grandfather   ?     cancer  ? Breast cancer Maternal Aunt 33  ?     Margot Chimes, late 44s  ?  Alcohol abuse Paternal Grandfather   ? ? ?Social History  ? ?Socioeconomic History  ? Marital status: Married  ?  Spouse name: Not on file  ? Number of children: 1  ? Years of education: Not on file  ? Highest education level: Not on file  ?Occupational History  ? Occupation: Special ED Teacher  ?  Comment: [employer]  ?Tobacco Use  ? Smoking status: Former  ?  Packs/day: 0.50  ?  Years: 16.00  ?  Pack years: 8.00  ?  Types: Cigarettes  ?  Quit date: 06/25/2014  ?  Years since quitting: 7.4  ? Smokeless tobacco: Never  ?Vaping Use  ? Vaping Use: Never used  ?Substance and Sexual Activity  ? Alcohol use: Not Currently  ?  Alcohol/week: 0.0 standard drinks  ? Drug use: No  ? Sexual activity: Yes  ?  Birth control/protection: I.U.D.  ?  Comment: Mirena  ?Other Topics Concern  ? Not on file  ?Social History Narrative  ? No regular exercise.  ?   ? Nutrition: 3 meals a day, limited fruits and vegetables, fast meals occasionally  ?   ? Cell: [phone]  ?   ? Married  ? Employed Teacher  ? College education  ? 2 children   ? Caffeine- 1-2 cups of coke daily  ? ?Social Determinants of Health  ? ?Financial Resource Strain: Not on file  ?Food Insecurity: Not on file  ?Transportation Needs: Not on file  ?Physical Activity: Not on file  ?Stress: Not on file  ?Social Connections: Not on file  ?Intimate Partner Violence: Not on file  ? ? ?Current Meds  ?Medication Sig  ? buPROPion (WELLBUTRIN XL) 300 MG 24 hr tablet TAKE 1 TABLET(300 MG) BY MOUTH DAILY  ? ergocalciferol (VITAMIN D2) 1.25 MG (50000 UT) capsule Take 1 capsule (50,000 Units total) by mouth once a week.  ? hydrochlorothiazide (HYDRODIURIL) 25 MG tablet TAKE 1 TABLET(25 MG) BY MOUTH DAILY  ? PARoxetine (PAXIL) 10 MG tablet TAKE 1 TABLET(10 MG) BY MOUTH DAILY  ? raloxifene (EVISTA) 60 MG tablet TAKE 1 TABLET(60 MG) BY MOUTH DAILY  ? ? ? ?ROS: ? ?Review of Systems  ?Constitutional:  Negative for fatigue, fever and unexpected weight change.  ?Respiratory:   Negative for cough, shortness of breath and wheezing.   ?Cardiovascular:  Negative for chest pain, palpitations and leg swelling.  ?Gastrointestinal:  Negative for blood in stool, constipation, diarrhea, nausea and vomiting.  ?Endocrine: Negative for cold intolerance, heat intolerance and polyuria.  ?Genitourinary:  Negative for dyspareunia, dysuria, flank pain, frequency, genital sores, hematuria, menstrual problem, pelvic pain, urgency, vaginal bleeding, vaginal discharge and vaginal pain.  ?Musculoskeletal:  Negative for back pain, joint swelling and myalgias.  ?Skin:  Negative for rash.  ?Neurological:  Negative for dizziness, syncope, light-headedness, numbness and headaches.  ?Hematological:  Negative for adenopathy.  ?Psychiatric/Behavioral:  Negative for agitation, confusion, sleep disturbance and suicidal ideas. The patient is not nervous/anxious.   ? ? ?Objective: ?BP 120/70   Ht 5' 2"  (1.575 m)   Wt 191 lb (86.6 kg)   BMI 34.93 kg/m?  ? ? ?Physical Exam ?Constitutional:   ?   Appearance: She is well-developed.  ?Genitourinary:  ?   Vulva normal.  ?   Right Labia: No rash, tenderness or lesions. ?   Left Labia: No tenderness, lesions or rash. ?   No vaginal discharge, erythema or tenderness.  ? ?   Right Adnexa: not tender and no mass present. ?   Left Adnexa: not tender and no mass present. ?   No cervical motion tenderness, friability or polyp.  ?   IUD strings visualized.  ?   Uterus is not enlarged or tender.  ?Breasts: ?   Right: No mass, nipple discharge, skin change or tenderness.  ?   Left: No mass, nipple discharge, skin change or tenderness.  ?Neck:  ?   Thyroid: No thyromegaly.  ?Cardiovascular:  ?   Rate and Rhythm: Normal rate and regular rhythm.  ?   Heart sounds: Normal heart sounds. No murmur heard. ?Pulmonary:  ?   Effort: Pulmonary effort is normal.  ?   Breath sounds: Normal breath sounds.  ?Abdominal:  ?   Palpations: Abdomen is soft.  ?   Tenderness: There is no abdominal  tenderness. There is no guarding or rebound.  ?Musculoskeletal:     ?   General: Normal range of motion.  ?   Cervical back: Normal range of motion.  ?Lymphadenopathy:  ?   Cervical: No cervical adenopathy.  ?Neurological:  ?   General:

## 2021-12-11 NOTE — Patient Instructions (Signed)
I value your feedback and you entrusting us with your care. If you get a [facility] patient survey, I would appreciate you taking the time to let us know about your experience today. Thank you! ? ? ?

## 2021-12-12 ENCOUNTER — Encounter: Payer: Self-pay | Admitting: Obstetrics and Gynecology

## 2021-12-12 DIAGNOSIS — Z9189 Other specified personal risk factors, not elsewhere classified: Secondary | ICD-10-CM | POA: Insufficient documentation

## 2021-12-12 DIAGNOSIS — Z803 Family history of malignant neoplasm of breast: Secondary | ICD-10-CM | POA: Insufficient documentation

## 2021-12-18 ENCOUNTER — Telehealth: Payer: Self-pay | Admitting: Internal Medicine

## 2021-12-18 NOTE — Telephone Encounter (Signed)
Pt called in statin that she is having shoulder blade pain in the back of her shoulder... Pt stated that the shoulder blade pain have been going on for a little while... Pt stated that the pain from her shoulder blade shots down her arm into her hand making her feel numbness and tingling in her hands... Pt did stated that the pain is on the right side... No avail appt today... Transfer pt to Access Nurse...  ?

## 2021-12-18 NOTE — Telephone Encounter (Signed)
LMTCB

## 2021-12-18 NOTE — Telephone Encounter (Signed)
Patient refused to go to the ED .  

## 2022-02-17 ENCOUNTER — Other Ambulatory Visit: Payer: Self-pay | Admitting: General Surgery

## 2022-02-17 DIAGNOSIS — R920 Mammographic microcalcification found on diagnostic imaging of breast: Secondary | ICD-10-CM

## 2022-02-19 ENCOUNTER — Other Ambulatory Visit: Payer: Self-pay | Admitting: General Surgery

## 2022-02-19 NOTE — Progress Notes (Signed)
Subjective:     Patient ID: Ariana Weeks is a 51 y.o. female.   HPI   The following portions of the patient's history were reviewed and updated as appropriate.   This an established patient is here today for: office visit. Patient has been referred by Ardeth Perfect, PA for evaluation of a colonoscopy.  She states her bowels move dialy, no bleeding. She denies nay family history of colon cancer. History of LCIS in 2018, she denies any breast issues.  Mammograms are scheduled for in the next month or 2.            Chief Complaint  Patient presents with   Pre-op Exam      BP 92/64   Temp 36.6 C (97.8 F)   Ht 157.5 cm (5' 2.01")   Wt 84.8 kg (187 lb)   BMI 34.19 kg/m        Past Medical History:  Diagnosis Date   Advanced maternal age in multigravida     Anxiety     BRCA negative     Calculus of kidney     Depression     Diabetes mellitus type 2, uncomplicated (CMS-HCC)      GESTATIONAL DIBETES SINCE SEPT   History of abnormal cervical Pap smear     History of frequent urinary tract infections     Hypercholesteremia     IBS (irritable bowel syndrome)     Kidney stones     Malignant neoplasm of right breast (CMS-HCC) 01/2017    LCIS/ALH identified on excision.  Treatment with Evista.   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  Chemoprevention with Evista   BREAST EXCISIONAL BIOPSY Right 03/04/2017   OTHER SURGERY   07/2019    gastric sleeve   CYSTOSCOPY W/ URETEROSCOPY Left 07/10/2020    holmium laser/stent placement        OB History       Gravida  2   Para  2   Term  1   Preterm  1   AB      Living  2        SAB      IAB      Ectopic      Molar      Multiple  0   Live Births  2        Obstetric Comments  Age at first period 31 Age at first pregnancy 78             Social History           Socioeconomic History   Marital status: Married  Tobacco  Use   Smoking status: Former      Packs/day: 0.50      Years: 16.00      Pack years: 8.00      Types: Cigarettes   Smokeless tobacco: Never  Substance and Sexual Activity   Alcohol use: Yes      Comment: once a year   Drug use: No   Sexual activity: Yes  Social History Narrative    Lives in [place] with husband. She is a [employer]. Her husband works in Writer.             Allergies  Allergen Reactions   Hydromorphone Hcl Itching      Red face and itching   Azithromycin Unknown   Macrobid [Nitrofurantoin Monohyd/M-Cryst] Unknown   Morphine Hives and Rash      Current Medications        Current Outpatient Medications  Medication Sig Dispense Refill   buPROPion (WELLBUTRIN XL) 150 MG XL tablet bupropion HCl XL 150 mg 24 hr tablet, extended release       ergocalciferol, vitamin D2, 1,250 mcg (50,000 unit) capsule Take 50,000 Units by mouth every 7 (seven) days       hydroCHLOROthiazide (HYDRODIURIL) 25 MG tablet Take 25 mg by mouth once daily          PARoxetine (PAXIL) 10 MG tablet         phentermine (ADIPEX-P) 37.5 mg tablet Take 37.5 mg by mouth every morning before breakfast       raloxifene (EVISTA) 60 mg tablet Take 1 tablet (60 mg total) by mouth once daily  90 tablet 3   cyclobenzaprine (FLEXERIL) 10 MG tablet 1/2-1 po qHS prn (Patient not taking: Reported on 02/18/2022) 30 tablet 5   predniSONE (DELTASONE) 10 MG tablet 6 day taper - Take as directed (Patient not taking: Reported on 02/18/2022) 21 tablet 0    No current facility-administered medications for this visit.             Family History  Problem Relation Age of Onset   Breast cancer Mother 76   Lung cancer Father 55   Breast cancer Maternal Aunt 32   Alcohol abuse Maternal Uncle     Alcohol abuse Paternal Uncle     Ovarian cancer Maternal Grandmother 76   Lymphoma Maternal Grandfather     Alcohol abuse Paternal Grandfather     Irregular Heart Beat (Arrhythmia) Neg Hx     Congenital heart disease Neg Hx     SIDS Neg Hx     Sudden death (unexpected death due to unknown cause) Neg Hx     Colon cancer Neg Hx          Labs and Radiology:    August 30, 2021 laboratory:   WBC 3.4 - 10.8 x10E3/uL 6.8   RBC 3.77 - 5.28 x10E6/uL 4.56   Hemoglobin 11.1 - 15.9 g/dL 13.8   Hematocrit 34.0 - 46.6 % 40.4   MCV 79 - 97 fL 89   MCH 26.6 - 33.0 pg 30.3   MCHC 31.5 - 35.7 g/dL 34.2   RDW 11.7 - 15.4 % 13.3   Platelets 150 - 450 x10E3/uL 259   Neutrophils Not Estab. % 61   Lymphs Not Estab. % 27   Monocytes Not Estab. % 10   Eos Not Estab. % 1   Basos Not Estab. % 0   Neutrophils Absolute 1.4 - 7.0 x10E3/uL 4.2   Lymphocytes Absolute 0.7 - 3.1  x10E3/uL 1.9     Glucose 70 - 99 mg/dL 72   BUN 6 - 24 mg/dL 20   Creatinine, Ser 0.57 - 1.00 mg/dL 0.81   eGFR >59 mL/min/1.73 89   BUN/Creatinine Ratio 9 - 23 25 High    Sodium 134 - 144 mmol/L 140   Potassium 3.5 - 5.2 mmol/L 3.7   Chloride 96 - 106 mmol/L 101   CO2 20 - 29 mmol/L 27   Calcium 8.7 - 10.2 mg/dL 9.4   Total Protein 6.0 - 8.5 g/dL 6.5   Albumin 3.8 - 4.8 g/dL 4.1   Globulin, Total 1.5 - 4.5 g/dL 2.4   Albumin/Globulin Ratio 1.2 - 2.2 1.7   Bilirubin Total 0.0 - 1.2 mg/dL 0.3   Alkaline Phosphatase 44 - 121 IU/L 63    AST 0 - 40 IU/L 16   ALT 0 - 32 IU/L 16       April 08, 2021 laboratory review:   Stable left breast central microcalcification.   February 19, 2017 pathology:   DIAGNOSIS:  A. BREAST, RIGHT, UPPER OUTER QUADRANT; STEREOTACTIC-GUIDED CORE BIOPSY:  - COLUMNAR CELL CHANGE ASSOCIATED WITH CALCIFICATIONS.  - A FEW FOCI OF ATYPICAL LOBULAR HYPERPLASIA.  - NEGATIVE FOR CARCINOMA.   March 04, 2017 surgical excision:   A.  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  - FOCAL FLAT EPITHELIAL ATYPIA.  - FIBROADENOMATOUS CHANGE.  - COLUMNAR CELL CHANGE.  - PSEUDO-ANGIOMATOUS STROMAL HYPERPLASIA.  - FIBROCYSTIC CHANGE.  - BIOPSY SITE CHANGES, MARKER CLIP PRESENT.    Review of Systems  Constitutional:  Negative for chills and fever.  Respiratory:  Negative for cough.   Gastrointestinal:  Negative for anal bleeding and blood in stool.       Objective:   Physical Exam Exam conducted with a chaperone present.  Constitutional:      Appearance: Normal appearance.  Cardiovascular:     Rate and Rhythm: Normal rate and regular rhythm.     Pulses: Normal pulses.     Heart sounds: Normal heart sounds.  Pulmonary:     Effort: Pulmonary effort is normal.     Breath sounds: Normal breath sounds.  Chest:  Breasts:    Right: Normal.     Left: Normal.        Comments: Well-healed wide excision site in the upper outer quadrant of the right breast. Musculoskeletal:     Cervical back: Neck supple.  Lymphadenopathy:     Upper Body:     Right upper body: No supraclavicular or axillary adenopathy.     Left upper body: No supraclavicular or axillary adenopathy.  Skin:    General: Skin is warm and dry.  Neurological:     Mental Status: She is alert and oriented to person, place, and time.  Psychiatric:        Mood and Affect: Mood normal.        Behavior: Behavior normal.         Assessment:     LCIS making use of  Evista for chemoprevention.   Candidate for screening colonoscopy.    Plan:     Options for colon cancer screening were reviewed: 1) Cologuard versus 2) colonoscopy.  Pros and cons/risk and benefits of each discussed.  Patient desires to proceed to colonoscopy.  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   She has been asked to contact the office after her  upcoming mammogram so they can be reviewed.   She is now completing 5 years of chemoprevention and will discuss cessation of Evista therapy.    This note is partially prepared by Ledell Noss, CMA acting as a scribe in the presence of Dr. Hervey Ard, MD.    The documentation recorded by the scribe accurately reflects the service I personally performed and the decisions made by me.    [name], MD FACS

## 2022-03-05 ENCOUNTER — Telehealth: Payer: Self-pay

## 2022-03-05 NOTE — Telephone Encounter (Signed)
Patient states she needs a refill for her buPROPion (WELLBUTRIN XL) 300 MG 24 hr tablet.  Patient states she has two or three pills left.  *Patient states her preferred pharmacy is [facility] on the corner of [address] and [address].

## 2022-03-06 MED ORDER — BUPROPION HCL ER (XL) 300 MG PO TB24: ORAL_TABLET | ORAL | 0 refills | Status: DC

## 2022-03-06 NOTE — Telephone Encounter (Signed)
LMTCB. Medication has been refilled for 30 days only. Pt will need to schedule an appt before any more refills.

## 2022-03-07 NOTE — Telephone Encounter (Signed)
noted 

## 2022-03-07 NOTE — Telephone Encounter (Signed)
Patient returned office phone call. Note was read. Patient stated she was driving and will call back to schedule a medication follow up before next refill.

## 2022-03-13 ENCOUNTER — Encounter (HOSPITAL_COMMUNITY): Payer: Self-pay | Admitting: *Deleted

## 2022-03-25 ENCOUNTER — Ambulatory Visit: Payer: BC Managed Care – PPO | Admitting: Internal Medicine

## 2022-03-25 ENCOUNTER — Encounter: Payer: Self-pay | Admitting: Internal Medicine

## 2022-03-25 VITALS — BP 118/70 | HR 97 | Temp 97.6°F | Ht 62.0 in | Wt 185.6 lb

## 2022-03-25 DIAGNOSIS — E876 Hypokalemia: Secondary | ICD-10-CM | POA: Diagnosis not present

## 2022-03-25 DIAGNOSIS — T502X5A Adverse effect of carbonic-anhydrase inhibitors, benzothiadiazides and other diuretics, initial encounter: Secondary | ICD-10-CM

## 2022-03-25 DIAGNOSIS — F419 Anxiety disorder, unspecified: Secondary | ICD-10-CM

## 2022-03-25 DIAGNOSIS — F32A Depression, unspecified: Secondary | ICD-10-CM

## 2022-03-25 DIAGNOSIS — E785 Hyperlipidemia, unspecified: Secondary | ICD-10-CM | POA: Diagnosis not present

## 2022-03-25 DIAGNOSIS — I10 Essential (primary) hypertension: Secondary | ICD-10-CM

## 2022-03-25 LAB — COMPREHENSIVE METABOLIC PANEL
ALT: 17 U/L (ref 0–35)
AST: 16 U/L (ref 0–37)
Albumin: 3.9 g/dL (ref 3.5–5.2)
Alkaline Phosphatase: 45 U/L (ref 39–117)
BUN: 19 mg/dL (ref 6–23)
CO2: 29 mEq/L (ref 19–32)
Calcium: 9.6 mg/dL (ref 8.4–10.5)
Chloride: 98 mEq/L (ref 96–112)
Creatinine, Ser: 0.8 mg/dL (ref 0.40–1.20)
GFR: 86.82 mL/min (ref >60.00)
Glucose, Bld: 87 mg/dL (ref 70–99)
Potassium: 3 mEq/L — ABNORMAL LOW (ref 3.5–5.1)
Sodium: 136 mEq/L (ref 135–145)
Total Bilirubin: 0.7 mg/dL (ref 0.2–1.2)
Total Protein: 6.7 g/dL (ref 6.0–8.3)

## 2022-03-25 LAB — MICROALBUMIN / CREATININE URINE RATIO
Creatinine,U: 38.9 mg/dL
Microalb Creat Ratio: 1.8 mg/g (ref 0.0–30.0)
Microalb, Ur: <0.7 mg/dL (ref 0.0–1.9)

## 2022-03-25 MED ORDER — BUPROPION HCL ER (XL) 300 MG PO TB24: ORAL_TABLET | ORAL | 1 refills | Status: DC

## 2022-03-25 NOTE — Patient Instructions (Signed)
Continue current medications.  I have refilled for 90 days  Hold the hctz if you have a bout of gastroenteritis until you are feeling better.  (To avoid dehydration)

## 2022-03-25 NOTE — Progress Notes (Signed)
Subjective:  Patient ID: Ariana Weeks, female    DOB: 1970-12-02  Age: 51 y.o. MRN: [id number]  CC: The primary encounter diagnosis was Hypertension, unspecified type. Diagnoses of Hyperlipidemia, unspecified hyperlipidemia type and Anxiety and depression were also pertinent to this visit.   HPI Ariana Weeks presents for follow up on several issues.  Chief Complaint  Patient presents with   Follow-up    Follow up for medication refills   1) GAD/depression: symptoms are currently controlled, pratly because her schedule is less hectic because of summer season.  She is out of work for the summer.  Sleeping well . 2) son Kellie Simmering needs ABA ,   applied behavioral analysis,  has been trying to get it for him for the past 3 years; he is starting 2nd grade.  He has Autism and Down's . Going to [employer]. Finallly on the waiting list.  .  3( HTN:  patient is taking hctz  as prescribed and notes no adverse effects.  Home BP readings have been done about once per week and are  generally < 130/80 .  She is avoiding added salt in her diet and walking regularly about 3 times per week for exercise  .    Outpatient Medications Prior to Visit  Medication Sig Dispense Refill   ergocalciferol (VITAMIN D2) 1.25 MG (50000 UT) capsule Take 1 capsule (50,000 Units total) by mouth once a week. 12 capsule 0   hydrochlorothiazide (HYDRODIURIL) 25 MG tablet TAKE 1 TABLET(25 MG) BY MOUTH DAILY 90 tablet 1   PARoxetine (PAXIL) 10 MG tablet TAKE 1 TABLET(10 MG) BY MOUTH DAILY 90 tablet 0   raloxifene (EVISTA) 60 MG tablet TAKE 1 TABLET(60 MG) BY MOUTH DAILY 30 tablet 0   buPROPion (WELLBUTRIN XL) 300 MG 24 hr tablet TAKE 1 TABLET(300 MG) BY MOUTH DAILY 30 tablet 0   levonorgestrel (MIRENA) 20 MCG/24HR IUD 1 each by Intrauterine route once.     No facility-administered medications prior to visit.    Review of Systems;  Patient denies headache, fevers, malaise, unintentional weight loss, skin rash, eye  pain, sinus congestion and sinus pain, sore throat, dysphagia,  hemoptysis , cough, dyspnea, wheezing, chest pain, palpitations, orthopnea, edema, abdominal pain, nausea, melena, diarrhea, constipation, flank pain, dysuria, hematuria, urinary  Frequency, nocturia, numbness, tingling, seizures,  Focal weakness, Loss of consciousness,  Tremor, insomnia, depression, anxiety, and suicidal ideation.      Objective:  BP 118/70 (BP Location: Left Arm, Patient Position: Sitting, Cuff Size: Normal)   Pulse 97   Temp 97.6 F (36.4 C) (Oral)   Ht 5' 2"  (1.575 m)   Wt 185 lb 9.6 oz (84.2 kg)   LMP  (LMP Unknown)   SpO2 98%   BMI 33.95 kg/m   BP Readings from Last 3 Encounters:  03/25/22 118/70  12/11/21 120/70  01/30/21 102/64    Wt Readings from Last 3 Encounters:  03/25/22 185 lb 9.6 oz (84.2 kg)  12/11/21 191 lb (86.6 kg)  07/30/21 172 lb (78 kg)    General appearance: alert, cooperative and appears stated age Ears: normal TM's and external ear canals both ears Throat: lips, mucosa, and tongue normal; teeth and gums normal Neck: no adenopathy, no carotid bruit, supple, symmetrical, trachea midline and thyroid not enlarged, symmetric, no tenderness/mass/nodules Back: symmetric, no curvature. ROM normal. No CVA tenderness. Lungs: clear to auscultation bilaterally Heart: regular rate and rhythm, S1, S2 normal, no murmur, click, rub or gallop Abdomen: soft, non-tender; bowel  sounds normal; no masses,  no organomegaly Pulses: 2+ and symmetric Skin: Skin color, texture, turgor normal. No rashes or lesions Lymph nodes: Cervical, supraclavicular, and axillary nodes normal. Psych: affect normal, makes good eye contact. No fidgeting,  Smiles easily.  Denies suicidal thoughts    Lab Results  Component Value Date   HGBA1C 5.3 06/09/2018   HGBA1C 5.4 06/14/2015    Lab Results  Component Value Date   CREATININE 0.80 03/25/2022   CREATININE 0.81 08/30/2021   CREATININE 0.77 08/09/2019     Lab Results  Component Value Date   WBC 6.8 08/30/2021   HGB 13.8 08/30/2021   HCT 40.4 08/30/2021   PLT 259 08/30/2021   GLUCOSE 87 03/25/2022   CHOL 200 (H) 08/30/2021   TRIG 154 (H) 08/30/2021   HDL 45 08/30/2021   LDLDIRECT 115.0 05/28/2018   LDLCALC 128 (H) 08/30/2021   ALT 17 03/25/2022   AST 16 03/25/2022   NA 136 03/25/2022   K 3.0 (L) 03/25/2022   CL 98 03/25/2022   CREATININE 0.80 03/25/2022   BUN 19 03/25/2022   CO2 29 03/25/2022   TSH 1.950 08/30/2021   HGBA1C 5.3 06/09/2018   MICROALBUR <0.7 03/25/2022    MM DIAG BREAST TOMO BILATERAL  Result Date: 04/08/2021 CLINICAL DATA:  51 year old female presenting for annual bilateral mammogram in 1 year follow-up probably benign left breast calcifications. EXAM: DIGITAL DIAGNOSTIC BILATERAL MAMMOGRAM WITH TOMOSYNTHESIS AND CAD TECHNIQUE: Bilateral digital diagnostic mammography and breast tomosynthesis was performed. The images were evaluated with computer-aided detection. COMPARISON:  Previous exam(s). ACR Breast Density Category c: The breast tissue is heterogeneously dense, which may obscure small masses. FINDINGS: Grouped punctate calcifications in the central medial left breast are mammographically stable. No new or suspicious findings are identified in either breast. The parenchymal pattern is stable. IMPRESSION: 1. Stable, probably benign left breast calcifications. Recommend a final follow-up in 1 year. 2. No mammographic evidence of malignancy on the right. RECOMMENDATION: Bilateral diagnostic mammogram in 1 year. I have discussed the findings and recommendations with the patient. If applicable, a reminder letter will be sent to the patient regarding the next appointment. BI-RADS CATEGORY  3: Probably benign. Electronically Signed   By: Kristopher Oppenheim M.D.   On: 04/08/2021 15:30   Assessment & Plan:   Problem List Items Addressed This Visit     Anxiety and depression    sympotms are well controlled currently on  wellbutrin and paxil.  No changes today       Relevant Medications   buPROPion (WELLBUTRIN XL) 300 MG 24 hr tablet   Hypertension - Primary    Well controlled on current regimen of hctz 25 mg . Renal function stable, no changes today.      Relevant Orders   Comp Met (CMET) (Completed)   Urine Microalbumin w/creat. ratio (Completed)   Other Visit Diagnoses     Hyperlipidemia, unspecified hyperlipidemia type           Follow-up: Return in about 6 months (around 09/25/2022).   [name], MD

## 2022-03-25 NOTE — Progress Notes (Signed)
Has colonoscopy scheduled for next week

## 2022-03-25 NOTE — Assessment & Plan Note (Signed)
sympotms are well controlled currently on wellbutrin and paxil.  No changes today

## 2022-03-25 NOTE — Assessment & Plan Note (Signed)
Well controlled on current regimen of hctz 25 mg . Renal function stable, no changes today.

## 2022-03-26 DIAGNOSIS — E876 Hypokalemia: Secondary | ICD-10-CM | POA: Insufficient documentation

## 2022-03-26 MED ORDER — POTASSIUM CHLORIDE CRYS ER 20 MEQ PO TBCR: 20.0000 meq | EXTENDED_RELEASE_TABLET | Freq: Two times a day (BID) | ORAL | 0 refills | Status: DC

## 2022-03-26 NOTE — Assessment & Plan Note (Signed)
Prescribing 40 meq daily for 5 days

## 2022-03-26 NOTE — Addendum Note (Signed)
Addended by: Sherlene Shams on: 03/26/2022 09:29 PM   Modules accepted: Orders

## 2022-03-28 ENCOUNTER — Other Ambulatory Visit: Payer: Self-pay | Admitting: Internal Medicine

## 2022-04-01 ENCOUNTER — Telehealth: Payer: Self-pay | Admitting: Internal Medicine

## 2022-04-01 NOTE — Telephone Encounter (Signed)
Attempted to call pt. No answer no voicemail.  

## 2022-04-01 NOTE — Telephone Encounter (Signed)
Pt called in stating that Dr. Darrick Huntsman prescribe her medication (potassium chloride SA (KLOR-CON M) 20 MEQ tablet)... Pt stated that she cant swallow the pills because they are to big... Pt was wondering if there is something else that she can prescribe.., Pt requesting callback.Marland KitchenMarland Kitchen

## 2022-04-02 ENCOUNTER — Ambulatory Visit: Admission: RE | Admit: 2022-04-02 | Payer: BC Managed Care – PPO | Source: Home / Self Care | Admitting: General Surgery

## 2022-04-02 ENCOUNTER — Encounter: Admission: RE | Payer: Self-pay | Source: Home / Self Care

## 2022-04-02 SURGERY — COLONOSCOPY WITH PROPOFOL
Anesthesia: General

## 2022-04-02 MED ORDER — POTASSIUM CHLORIDE 20 MEQ/15ML (10%) PO SOLN: 20.0000 meq | Freq: Two times a day (BID) | ORAL | 0 refills | Status: DC

## 2022-04-02 NOTE — Telephone Encounter (Signed)
Pt stated that she was unable to get any of them down. Can they be cut in half or is there an OTC dose that she can take.

## 2022-04-02 NOTE — Telephone Encounter (Signed)
Pt is aware of the medication change and how to take it.

## 2022-04-02 NOTE — Telephone Encounter (Signed)
Liquid potassium sent for 8 doses  ( twice daily x 4 )_

## 2022-04-02 NOTE — Telephone Encounter (Signed)
LMTCB

## 2022-04-03 ENCOUNTER — Other Ambulatory Visit: Payer: Self-pay | Admitting: Internal Medicine

## 2022-04-09 ENCOUNTER — Ambulatory Visit
Admission: RE | Admit: 2022-04-09 | Discharge: 2022-04-09 | Disposition: A | Discharge status: Home / Self Care | Payer: BC Managed Care – PPO | Source: Ambulatory Visit | Attending: General Surgery | Admitting: General Surgery

## 2022-04-09 DIAGNOSIS — R920 Mammographic microcalcification found on diagnostic imaging of breast: Secondary | ICD-10-CM | POA: Diagnosis present

## 2022-04-15 LAB — COLOGUARD: Cologuard: NEGATIVE

## 2022-04-20 LAB — COLOGUARD: COLOGUARD: NEGATIVE

## 2022-04-20 LAB — EXTERNAL GENERIC LAB PROCEDURE: COLOGUARD: NEGATIVE

## 2022-04-25 ENCOUNTER — Encounter: Payer: Self-pay | Admitting: Internal Medicine

## 2022-05-12 ENCOUNTER — Telehealth: Payer: Self-pay | Admitting: Internal Medicine

## 2022-05-12 NOTE — Telephone Encounter (Signed)
Spoke with pt and she stated that she has been taking phentermine. She stated that the bariatric center in [place] has been filling but she does not want to continue driving there to get it filled. Pt would like to see if you could fill it for her.

## 2022-05-12 NOTE — Telephone Encounter (Signed)
Pt would like to be called regarding refilling the medication-sertraline

## 2022-05-13 NOTE — Telephone Encounter (Signed)
Pt is aware.  

## 2022-07-28 ENCOUNTER — Other Ambulatory Visit: Payer: Self-pay

## 2022-07-28 ENCOUNTER — Telehealth: Payer: Self-pay | Admitting: Internal Medicine

## 2022-07-28 MED ORDER — PAROXETINE HCL 10 MG PO TABS
ORAL_TABLET | ORAL | 1 refills | Status: DC
Start: 2022-07-28 — End: 2023-03-20

## 2022-07-28 NOTE — Telephone Encounter (Signed)
Patient requesting a refill on her PARoxetine (PAXIL) 10 MG tablet

## 2022-07-29 NOTE — Telephone Encounter (Signed)
Medication was refilled yesterday.

## 2022-09-15 ENCOUNTER — Other Ambulatory Visit: Payer: Self-pay

## 2022-09-15 MED ORDER — BUPROPION HCL ER (XL) 300 MG PO TB24
ORAL_TABLET | ORAL | 1 refills | Status: DC
Start: 2022-09-15 — End: 2023-03-03

## 2022-09-26 ENCOUNTER — Telehealth (INDEPENDENT_AMBULATORY_CARE_PROVIDER_SITE_OTHER): Payer: BC Managed Care – PPO | Admitting: Internal Medicine

## 2022-09-26 ENCOUNTER — Encounter: Payer: Self-pay | Admitting: Internal Medicine

## 2022-09-26 VITALS — Ht 62.0 in | Wt 186.0 lb

## 2022-09-26 DIAGNOSIS — Z9189 Other specified personal risk factors, not elsewhere classified: Secondary | ICD-10-CM

## 2022-09-26 DIAGNOSIS — E785 Hyperlipidemia, unspecified: Secondary | ICD-10-CM | POA: Diagnosis not present

## 2022-09-26 DIAGNOSIS — D0501 Lobular carcinoma in situ of right breast: Secondary | ICD-10-CM

## 2022-09-26 DIAGNOSIS — R0683 Snoring: Secondary | ICD-10-CM

## 2022-09-26 DIAGNOSIS — Z975 Presence of (intrauterine) contraceptive device: Secondary | ICD-10-CM

## 2022-09-26 DIAGNOSIS — R5383 Other fatigue: Secondary | ICD-10-CM | POA: Diagnosis not present

## 2022-09-26 DIAGNOSIS — Z853 Personal history of malignant neoplasm of breast: Secondary | ICD-10-CM

## 2022-09-26 DIAGNOSIS — I10 Essential (primary) hypertension: Secondary | ICD-10-CM

## 2022-09-26 DIAGNOSIS — R7301 Impaired fasting glucose: Secondary | ICD-10-CM | POA: Diagnosis not present

## 2022-09-26 DIAGNOSIS — Z803 Family history of malignant neoplasm of breast: Secondary | ICD-10-CM

## 2022-09-26 MED ORDER — CHERATUSSIN AC 100-10 MG/5ML PO SOLN: 5.0000 mL | Freq: Three times a day (TID) | ORAL | 0 refills | Status: DC | PRN

## 2022-09-26 NOTE — Progress Notes (Unsigned)
Virtual Visit via [place]   Note   This format is felt to be most appropriate for this patient at this time.  All issues noted in this document were discussed and addressed.  No physical exam was performed (except for noted visual exam findings with Video Visits).   I connected with elley on 09/26/22 at  4:00 PM EST by a video enabled telemedicine application or telephone and verified that I am speaking with the correct person using two identifiers. Location patient: home Location provider: work or home office Persons participating in the virtual visit: patient, provider  I discussed the limitations, risks, security and privacy concerns of performing an evaluation and management service by telephone and the availability of in person appointments. I also discussed with the patient that there may be a patient responsible charge related to this service. The patient expressed understanding and agreed to proceed.  Interactive audio and video telecommunications were attempted between this provider and patient, however failed, due to patient having technical difficulties OR patient did not have access to video capability.  We continued and completed visit with audio only. ***  Reason for visit: med refill  HPI:  1) HTN:  taking hctz.  Last potassium was 3.0 August 2023 .  Was given one week of potassium supplement and advised to retrun in 2 weeks for repeat level  has not been done   2) Depression/anxiety   ROS: See pertinent positives and negatives per HPI.  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  [name]   Sleep apnea    no- c-pap   Supervision of high risk pregnancy, antepartum 05/18/2014   Formatting of this note might be different from the original. Case discussed at Peds/Perinatal multidisciplinary conf 05/18/2014. Aortic atresia/mitral stenosis Delivery plan: repeat c-section at 39 weeks    Past Surgical History:  Procedure Laterality Date   BREAST BIOPSY Right 02/02/2013   [place] bx/clip-neg   BREAST BIOPSY Right 02/19/2017   affirm, ATYPICAL LOBULAR HYPERPLASIA   BREAST BIOPSY Right 03/04/2017   Procedure: BREAST BIOPSY WITH NEEDLE LOCALIZATION;  Surgeon: [name], MD;  Location: [facility];  Service: General;  Laterality: Right;   BREAST LUMPECTOMY Right 2018   CESAREAN SECTION  2005 2015   CHOLECYSTECTOMY  09/2012   CYSTOSCOPY W/ RETROGRADES N/A 02/02/2017   Procedure: CYSTOSCOPY WITH RETROGRADE PYELOGRAM;  Surgeon: [name], MD;  Location: [facility];  Service: Urology;  Laterality: N/A;   CYSTOSCOPY/URETEROSCOPY/HOLMIUM LASER/STENT PLACEMENT Left 07/10/2020   Procedure: CYSTOSCOPY/URETEROSCOPY/HOLMIUM LASER/STENT PLACEMENT;  Surgeon: [name], MD;  Location: [facility];  Service: Urology;  Laterality: Left;   EXTRACORPOREAL SHOCK WAVE LITHOTRIPSY  2009   INTRAUTERINE DEVICE INSERTION  MARCH 2012   [place]UR[place]   LAPAROSCOPIC GASTRIC SLEEVE RESECTION N/A 08/09/2019   Procedure: LAPAROSCOPIC GASTRIC SLEEVE RESECTION, Upper Endo, ER[place];  Surgeon: [name], MD;  Location: [facility];  Service: General;  Laterality: N/A;   [place]  (  APPROX)   Kidney Stone   URETEROSCOPY WITH HOLMIUM LASER LITHOTRIPSY Left 02/02/2017   Procedure: URETEROSCOPY WITH HOLMIUM LASER LITHOTRIPSY;  Surgeon: [name], MD;  Location: [facility];  Service: Urology;  Laterality: Left;    Family History  Problem Relation Age of Onset   Lung cancer Father    Other Mother        breast cyst   Breast cancer Mother 79       tested   Alcohol abuse Maternal Uncle     Alcohol abuse Paternal Uncle    Ovarian cancer Maternal Grandmother 76   Lymphoma Maternal Grandfather        cancer   Breast cancer Maternal Aunt 70       Great Aunt, late 20s   Alcohol abuse Paternal Grandfather     SOCIAL HX: ***   Current Outpatient Medications:    buPROPion (WELLBUTRIN XL) 300 MG 24 hr tablet, TAKE 1 TABLET(300 MG) BY MOUTH DAILY, Disp: 90 tablet, Rfl: 1   hydrochlorothiazide (HYDRODIURIL) 25 MG tablet, TAKE 1 TABLET(25 MG) BY MOUTH DAILY, Disp: 90 tablet, Rfl: 1   levonorgestrel (MIRENA) 20 MCG/24HR IUD, 1 each by Intrauterine route once., Disp: , Rfl:    PARoxetine (PAXIL) 10 MG tablet, TAKE 1 TABLET(10 MG) BY MOUTH DAILY, Disp: 90 tablet, Rfl: 1   potassium chloride 20 MEQ/15ML (10%) SOLN, Take 15 mLs (20 mEq total) by mouth 2 (two) times daily for 4 days., Disp: 120 mL, Rfl: 0   potassium chloride SA (KLOR-CON M) 20 MEQ tablet, Take 1 tablet (20 mEq total) by mouth 2 (two) times daily., Disp: 10 tablet, Rfl: 0   raloxifene (EVISTA) 60 MG tablet, TAKE 1 TABLET(60 MG) BY MOUTH DAILY, Disp: 30 tablet, Rfl: 0  EXAM:  VITALS per patient if applicable:  GENERAL: alert, oriented, appears well and in no acute distress  HEENT: atraumatic, conjunttiva clear, no obvious abnormalities on inspection of external nose and ears  NECK: normal movements of the head and neck  LUNGS: on inspection no signs of respiratory distress, breathing rate appears normal, no obvious gross SOB, gasping or wheezing  CV: no obvious cyanosis  MS: moves all visible extremities without noticeable abnormality  PSYCH/NEURO: pleasant and cooperative, no obvious depression or anxiety, speech and thought processing grossly intact  ASSESSMENT AND PLAN: Hypertension, unspecified type  Hyperlipidemia, unspecified hyperlipidemia type  Impaired fasting glucose  Other fatigue      I discussed the assessment and treatment plan with the patient. The patient was provided an opportunity to  ask questions and all were answered. The patient agreed with the plan and demonstrated an understanding of the instructions.   The patient was advised to call back or seek an in-person evaluation if the symptoms worsen or if the condition fails to improve as anticipated.   I spent 30 minutes dedicated to the care of this patient on the date of this encounter to include pre-visit review of his medical history,  Face-to-face time with the patient , and post visit ordering of testing and therapeutics.    Ariana Mc, MD

## 2022-09-26 NOTE — Assessment & Plan Note (Signed)
Has completed 5 years of Evista .  Dr [name] has retired.  Referring to Windell Moment for consideration of follow up

## 2022-09-28 DIAGNOSIS — Z975 Presence of (intrauterine) contraceptive device: Secondary | ICD-10-CM

## 2022-09-28 HISTORY — DX: Presence of (intrauterine) contraceptive device: Z97.5

## 2022-09-28 NOTE — Assessment & Plan Note (Signed)
She has multiple risks for OSA: snoroing  obesity, and hypertension.  Sleep study ordered

## 2022-09-28 NOTE — Assessment & Plan Note (Signed)
Continue follow up with surgery.  Referral made

## 2022-10-09 ENCOUNTER — Ambulatory Visit (INDEPENDENT_AMBULATORY_CARE_PROVIDER_SITE_OTHER): Payer: BC Managed Care – PPO

## 2022-10-09 ENCOUNTER — Ambulatory Visit: Payer: BC Managed Care – PPO

## 2022-10-09 DIAGNOSIS — R7301 Impaired fasting glucose: Secondary | ICD-10-CM

## 2022-10-09 DIAGNOSIS — E785 Hyperlipidemia, unspecified: Secondary | ICD-10-CM

## 2022-10-09 DIAGNOSIS — R5383 Other fatigue: Secondary | ICD-10-CM

## 2022-10-09 DIAGNOSIS — I10 Essential (primary) hypertension: Secondary | ICD-10-CM | POA: Diagnosis not present

## 2022-10-09 NOTE — Progress Notes (Signed)
Pt presents today for a blood pressure check. Pt takes HCTZ 25 mg daily and last dose was this morning. BP in the left arm is 126/74 Pulse 100 and the right arm is 118/78 pulse 101. Pt was advised that she was good to go have labs drawn and we would call if any changes were needed.

## 2022-10-10 LAB — CBC WITH DIFFERENTIAL/PLATELET
Basophils Absolute: 0.1 10*3/uL (ref 0.0–0.1)
Basophils Relative: 0.8 % (ref 0.0–3.0)
Eosinophils Absolute: 0.1 10*3/uL (ref 0.0–0.7)
Eosinophils Relative: 1.2 % (ref 0.0–5.0)
HCT: 37.4 % (ref 36.0–46.0)
Hemoglobin: 13 g/dL (ref 12.0–15.0)
Lymphocytes Relative: 24.2 % (ref 12.0–46.0)
Lymphs Abs: 1.6 10*3/uL (ref 0.7–4.0)
MCHC: 34.9 g/dL (ref 30.0–36.0)
MCV: 87.5 fl (ref 78.0–100.0)
Monocytes Absolute: 0.5 10*3/uL (ref 0.1–1.0)
Monocytes Relative: 7.1 % (ref 3.0–12.0)
Neutro Abs: 4.3 10*3/uL (ref 1.4–7.7)
Neutrophils Relative %: 66.7 % (ref 43.0–77.0)
Platelets: 262 10*3/uL (ref 150.0–400.0)
RBC: 4.27 Mil/uL (ref 3.87–5.11)
RDW: 13.6 % (ref 11.5–15.5)
WBC: 6.5 10*3/uL (ref 4.0–10.5)

## 2022-10-10 LAB — MICROALBUMIN / CREATININE URINE RATIO
Creatinine,U: 101.7 mg/dL
Microalb Creat Ratio: 0.7 mg/g (ref 0.0–30.0)
Microalb, Ur: <0.7 mg/dL (ref 0.0–1.9)

## 2022-10-10 LAB — LIPID PANEL
Cholesterol: 190 mg/dL (ref 0–200)
HDL: 38.2 mg/dL — ABNORMAL LOW (ref >39.00)
Total CHOL/HDL Ratio: 5
Triglycerides: 682 mg/dL — ABNORMAL HIGH (ref 0.0–149.0)

## 2022-10-10 LAB — COMPREHENSIVE METABOLIC PANEL
ALT: 12 U/L (ref 0–35)
AST: 13 U/L (ref 0–37)
Albumin: 3.7 g/dL (ref 3.5–5.2)
Alkaline Phosphatase: 54 U/L (ref 39–117)
BUN: 20 mg/dL (ref 6–23)
CO2: 28 mEq/L (ref 19–32)
Calcium: 9.2 mg/dL (ref 8.4–10.5)
Chloride: 103 mEq/L (ref 96–112)
Creatinine, Ser: 0.88 mg/dL (ref 0.40–1.20)
GFR: 77.15 mL/min (ref >60.00)
Glucose, Bld: 136 mg/dL — ABNORMAL HIGH (ref 70–99)
Potassium: 3.5 mEq/L (ref 3.5–5.1)
Sodium: 139 mEq/L (ref 135–145)
Total Bilirubin: 0.3 mg/dL (ref 0.2–1.2)
Total Protein: 6 g/dL (ref 6.0–8.3)

## 2022-10-10 LAB — TSH: TSH: 1.26 u[IU]/mL (ref 0.35–5.50)

## 2022-10-10 LAB — LDL CHOLESTEROL, DIRECT: Direct LDL: 110 mg/dL

## 2022-10-10 LAB — HEMOGLOBIN A1C: Hgb A1c MFr Bld: 5.9 % (ref 4.6–6.5)

## 2022-10-12 ENCOUNTER — Other Ambulatory Visit: Payer: Self-pay | Admitting: Internal Medicine

## 2022-10-12 DIAGNOSIS — R7303 Prediabetes: Secondary | ICD-10-CM

## 2022-10-12 DIAGNOSIS — E781 Pure hyperglyceridemia: Secondary | ICD-10-CM | POA: Insufficient documentation

## 2022-11-12 ENCOUNTER — Telehealth: Payer: Self-pay | Admitting: Internal Medicine

## 2022-11-12 NOTE — Telephone Encounter (Signed)
FYI  I called and left a message with patient and sent a mycahrt message.

## 2022-11-12 NOTE — Telephone Encounter (Signed)
Melanie from [facility] called stating she reached out to the pt several times and they pt is not responding back to the appointment

## 2022-11-24 ENCOUNTER — Encounter: Payer: Self-pay | Admitting: Obstetrics and Gynecology

## 2023-01-12 ENCOUNTER — Ambulatory Visit: Payer: BC Managed Care – PPO | Admitting: Obstetrics and Gynecology

## 2023-01-18 NOTE — Progress Notes (Unsigned)
PCP:  Sherlene Shams, MD   No chief complaint on file.    HPI:      [name] is a 52 y.o. [id number] who LMP was No LMP recorded. (Menstrual status: IUD)., presents today for her annual examination.  Her menses are absent with IUD.  Dysmenorrhea none. She does not have intermenstrual bleeding. No vasomotor sx.  Sex activity: single partner, contraception - IUD. Mirena placed 09/25/14. Has 8 yr indication. No pain/dryness Last Pap: 09/26/20  Results were: no abnormalities /neg HPV DNA  Hx of STDs: none  Last mammogram: 04/09/22 Results were normal; Hx of atypical lobular hyperplasia and lobular CIS with Dr. Lemar Livings . Pt doing evista for 5 yrs--on year 4 (couldn't take tamoxifen with her mood meds); sees DR. Byrnett Q6 months  There is a FH of breast cancer in her mom (lobular CIS). There is a FH of ovarian cancer in her MGM, Pt is MyRisk neg except AXIN2 VUS 2020; IBIS=66% due to hx of lobular CIS. The patient does not do self-breast exams.   Tobacco use: The patient denies current or previous tobacco use. Alcohol use: none No drug use.  Exercise: min active  Colonoscopy: never; wants to do with Dr. Lemar Livings  She does not get adequate calcium but does get Vitamin D in her diet.  Labs with PCP.    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 Lemar Livings   Sleep apnea    no- c-pap   Supervision of high risk pregnancy, antepartum 05/18/2014   Formatting of this note might be different from the original. Case discussed at Peds/Perinatal multidisciplinary conf 05/18/2014. Aortic atresia/mitral stenosis  Delivery plan: repeat c-section at 39 weeks    Past Surgical History:  Procedure Laterality Date   BREAST BIOPSY Right 02/02/2013   [place] bx/clip-neg   BREAST BIOPSY Right 02/19/2017   affirm, ATYPICAL LOBULAR HYPERPLASIA   BREAST BIOPSY Right 03/04/2017   Procedure: BREAST BIOPSY WITH NEEDLE LOCALIZATION;  Surgeon: Earline Mayotte, MD;  Location: [facility];  Service: General;  Laterality: Right;   BREAST LUMPECTOMY Right 2018   CESAREAN SECTION  2005 2015   CHOLECYSTECTOMY  09/2012   CYSTOSCOPY W/ RETROGRADES N/A 02/02/2017   Procedure: CYSTOSCOPY WITH RETROGRADE PYELOGRAM;  Surgeon: Bjorn Pippin, MD;  Location: [facility];  Service: Urology;  Laterality: N/A;   CYSTOSCOPY/URETEROSCOPY/HOLMIUM LASER/STENT PLACEMENT Left 07/10/2020   Procedure: CYSTOSCOPY/URETEROSCOPY/HOLMIUM LASER/STENT PLACEMENT;  Surgeon: Riki Altes, MD;  Location: [facility];  Service: Urology;  Laterality: Left;   EXTRACORPOREAL SHOCK WAVE LITHOTRIPSY  2009   INTRAUTERINE DEVICE INSERTION  MARCH 2012   MURINA   LAPAROSCOPIC GASTRIC SLEEVE RESECTION N/A 08/09/2019   Procedure: LAPAROSCOPIC GASTRIC SLEEVE RESECTION, Upper Endo, ERAS [place];  Surgeon: Luretha Murphy, MD;  Location: [facility];  Service: General;  Laterality: N/A;   URETERAL STENT PLACEMENT  1998  (APPROX)   Kidney Stone   URETEROSCOPY WITH HOLMIUM LASER LITHOTRIPSY Left 02/02/2017  Procedure: URETEROSCOPY WITH HOLMIUM LASER LITHOTRIPSY;  Surgeon: Bjorn Pippin, MD;  Location: [facility];  Service: Urology;  Laterality: Left;    Family History  Problem Relation Age of Onset   Lung cancer Father    Other Mother        breast cyst   Breast cancer Mother 1       tested   Alcohol abuse Maternal Uncle    Alcohol abuse Paternal Uncle    Ovarian cancer Maternal Grandmother 70   Lymphoma Maternal Grandfather        cancer   Breast cancer Maternal Aunt 54       Great Aunt, late 34s   Alcohol abuse Paternal Grandfather     Social History    Socioeconomic History   Marital status: Married    Spouse name: Not on file   Number of children: 1   Years of education: Not on file   Highest education level: Not on file  Occupational History   Occupation: Special ED Teacher    Comment: [employer]  Tobacco Use   Smoking status: Former    Packs/day: 0.50    Years: 16.00    Additional pack years: 0.00    Total pack years: 8.00    Types: Cigarettes    Quit date: 06/25/2014    Years since quitting: 8.5   Smokeless tobacco: Never  Vaping Use   Vaping Use: Never used  Substance and Sexual Activity   Alcohol use: Not Currently    Alcohol/week: 0.0 standard drinks of alcohol   Drug use: No   Sexual activity: Yes    Birth control/protection: I.U.D.    Comment: Mirena  Other Topics Concern   Not on file  Social History Narrative   No regular exercise.      Nutrition: 3 meals a day, limited fruits and vegetables, fast meals occasionally      Cell: [phone]      Married   Technical brewer education   2 children    Caffeine- 1-2 cups of coke daily   Social Determinants of Health   Financial Resource Strain: Not on file  Food Insecurity: Not on file  Transportation Needs: Not on file  Physical Activity: Not on file  Stress: Not on file  Social Connections: Not on file  Intimate Partner Violence: Not on file    No outpatient medications have been marked as taking for the 01/20/23 encounter (Appointment) with Kendria Halberg, Ilona Sorrel, PA-C.     ROS:  Review of Systems  Constitutional:  Negative for fatigue, fever and unexpected weight change.  Respiratory:  Negative for cough, shortness of breath and wheezing.   Cardiovascular:  Negative for chest pain, palpitations and leg swelling.  Gastrointestinal:  Negative for blood in stool, constipation, diarrhea, nausea and vomiting.  Endocrine: Negative for cold intolerance, heat intolerance and polyuria.  Genitourinary:  Negative for dyspareunia,  dysuria, flank pain, frequency, genital sores, hematuria, menstrual problem, pelvic pain, urgency, vaginal bleeding, vaginal discharge and vaginal pain.  Musculoskeletal:  Negative for back pain, joint swelling and myalgias.  Skin:  Negative for rash.  Neurological:  Negative for dizziness, syncope, light-headedness, numbness and headaches.  Hematological:  Negative for adenopathy.  Psychiatric/Behavioral:  Negative for agitation, confusion, sleep disturbance and suicidal ideas. The patient is not nervous/anxious.      Objective: There were no vitals taken for this visit.   Physical Exam Constitutional:      Appearance: She is well-developed.  Genitourinary:     Vulva normal.     Right Labia: No rash, tenderness or lesions.    Left Labia: No tenderness, lesions or rash.    No vaginal discharge, erythema or tenderness.      Right Adnexa: not tender and no mass present.    Left Adnexa: not tender and no mass present.    No cervical motion tenderness, friability or polyp.     IUD strings visualized.     Uterus is not enlarged or tender.  Breasts:    Right: No mass, nipple discharge, skin change or tenderness.     Left: No mass, nipple discharge, skin change or tenderness.  Neck:     Thyroid: No thyromegaly.  Cardiovascular:     Rate and Rhythm: Normal rate and regular rhythm.     Heart sounds: Normal heart sounds. No murmur heard. Pulmonary:     Effort: Pulmonary effort is normal.     Breath sounds: Normal breath sounds.  Abdominal:     Palpations: Abdomen is soft.     Tenderness: There is no abdominal tenderness. There is no guarding or rebound.  Musculoskeletal:        General: Normal range of motion.     Cervical back: Normal range of motion.  Lymphadenopathy:     Cervical: No cervical adenopathy.  Neurological:     General: No focal deficit present.     Mental Status: She is alert and oriented to person, place, and time.     Cranial Nerves: No cranial nerve deficit.   Skin:    General: Skin is warm and dry.  Psychiatric:        Mood and Affect: Mood normal.        Behavior: Behavior normal.        Thought Content: Thought content normal.        Judgment: Judgment normal.  Vitals reviewed.     Assessment/Plan: Encounter for annual routine gynecological examination  Encounter for routine checking of intrauterine contraceptive device (IUD)--IUD strings in cx os. Has 8 yr indication. Will change to paragard with removal 2/24 so no hormones, although inconclusive about Mirena IUD with her hx.   Encounter for screening mammogram for malignant neoplasm of breast--pt current on mammo, followed by DR. Byrnett  Lobular carcinoma in situ (LCIS) of right breast--on evista, followed by Dr. Lemar Livings  Family history of breast cancer--pt is MyRisk neg.   Increased risk of breast cancer--followed by Dr. Lemar Livings. Cont SBE, yearly CBE and mammos, could do screening breast MRI. Cont Vit D supp.   Screening for colon cancer--refer to DR. Byrnett for scr colonoscopy due to age  GYN counsel breast self exam, mammography screening, adequate intake of calcium and vitamin D, diet and exercise     F/U  No follow-ups on file.  Eleonore Shippee B. Chaunce Winkels, PA-C 01/18/2023 4:42 PM

## 2023-01-20 ENCOUNTER — Ambulatory Visit (INDEPENDENT_AMBULATORY_CARE_PROVIDER_SITE_OTHER): Payer: BC Managed Care – PPO | Admitting: Obstetrics and Gynecology

## 2023-01-20 ENCOUNTER — Other Ambulatory Visit: Payer: Self-pay

## 2023-01-20 ENCOUNTER — Encounter: Payer: Self-pay | Admitting: Obstetrics and Gynecology

## 2023-01-20 VITALS — BP 118/80 | Ht 62.0 in | Wt 178.0 lb

## 2023-01-20 DIAGNOSIS — Z803 Family history of malignant neoplasm of breast: Secondary | ICD-10-CM

## 2023-01-20 DIAGNOSIS — Z1231 Encounter for screening mammogram for malignant neoplasm of breast: Secondary | ICD-10-CM

## 2023-01-20 DIAGNOSIS — Z01419 Encounter for gynecological examination (general) (routine) without abnormal findings: Secondary | ICD-10-CM

## 2023-01-20 DIAGNOSIS — Z9189 Other specified personal risk factors, not elsewhere classified: Secondary | ICD-10-CM

## 2023-01-20 DIAGNOSIS — Z30431 Encounter for routine checking of intrauterine contraceptive device: Secondary | ICD-10-CM

## 2023-01-20 DIAGNOSIS — D0501 Lobular carcinoma in situ of right breast: Secondary | ICD-10-CM

## 2023-01-20 MED ORDER — HYDROCHLOROTHIAZIDE 25 MG PO TABS: ORAL_TABLET | ORAL | 1 refills | Status: DC

## 2023-01-20 NOTE — Patient Instructions (Addendum)
I value your feedback and you entrusting us with your care. If you get a [facility] patient survey, I would appreciate you taking the time to let us know about your experience today. Thank you!  [facility] ([place])--[phone]  

## 2023-02-06 ENCOUNTER — Other Ambulatory Visit: Payer: Self-pay | Admitting: General Surgery

## 2023-02-06 DIAGNOSIS — Z1231 Encounter for screening mammogram for malignant neoplasm of breast: Secondary | ICD-10-CM

## 2023-03-03 ENCOUNTER — Other Ambulatory Visit: Payer: Self-pay

## 2023-03-03 MED ORDER — BUPROPION HCL ER (XL) 300 MG PO TB24: ORAL_TABLET | ORAL | 1 refills | Status: DC

## 2023-03-20 ENCOUNTER — Other Ambulatory Visit: Payer: Self-pay | Admitting: Internal Medicine

## 2023-03-29 ENCOUNTER — Emergency Department: Payer: BC Managed Care – PPO

## 2023-03-29 ENCOUNTER — Emergency Department
Admission: EM | Admit: 2023-03-29 | Discharge: 2023-03-29 | Disposition: A | Discharge status: Home / Self Care | Payer: BC Managed Care – PPO | Source: Home / Self Care | Attending: Emergency Medicine | Admitting: Emergency Medicine

## 2023-03-29 ENCOUNTER — Other Ambulatory Visit: Payer: Self-pay

## 2023-03-29 DIAGNOSIS — M545 Low back pain, unspecified: Secondary | ICD-10-CM | POA: Diagnosis present

## 2023-03-29 DIAGNOSIS — I10 Essential (primary) hypertension: Secondary | ICD-10-CM | POA: Diagnosis not present

## 2023-03-29 DIAGNOSIS — M5441 Lumbago with sciatica, right side: Secondary | ICD-10-CM | POA: Diagnosis not present

## 2023-03-29 MED ORDER — FENTANYL CITRATE PF 50 MCG/ML IJ SOSY
50.0000 ug | PREFILLED_SYRINGE | Freq: Once | INTRAMUSCULAR | Status: AC
  Administered 2023-03-29: 50 ug via INTRAVENOUS
  Filled 2023-03-29: qty 1

## 2023-03-29 MED ORDER — PREDNISONE 20 MG PO TABS
60.0000 mg | ORAL_TABLET | Freq: Once | ORAL | Status: AC
  Administered 2023-03-29: 60 mg via ORAL
  Filled 2023-03-29: qty 3

## 2023-03-29 MED ORDER — CYCLOBENZAPRINE HCL 10 MG PO TABS: 10.0000 mg | ORAL_TABLET | Freq: Three times a day (TID) | ORAL | 0 refills | Status: DC | PRN

## 2023-03-29 MED ORDER — OXYCODONE-ACETAMINOPHEN 5-325 MG PO TABS
1.0000 | ORAL_TABLET | Freq: Once | ORAL | Status: AC
  Administered 2023-03-29: 1 via ORAL
  Filled 2023-03-29: qty 1

## 2023-03-29 MED ORDER — LIDOCAINE 5 % EX PTCH
1.0000 | MEDICATED_PATCH | CUTANEOUS | Status: DC
  Administered 2023-03-29: 1 via TRANSDERMAL
  Filled 2023-03-29: qty 1

## 2023-03-29 MED ORDER — KETOROLAC TROMETHAMINE 30 MG/ML IJ SOLN
30.0000 mg | Freq: Once | INTRAMUSCULAR | Status: AC
  Administered 2023-03-29: 30 mg via INTRAVENOUS
  Filled 2023-03-29: qty 1

## 2023-03-29 MED ORDER — PREDNISONE 50 MG PO TABS: ORAL_TABLET | ORAL | 0 refills | Status: DC

## 2023-03-29 NOTE — ED Provider Notes (Signed)
[facility] Provider Note    Event Date/Time   First MD Initiated Contact with Patient 03/29/23 1644     (approximate)   History   Back Pain   HPI  Ariana Weeks is a 52 y.o. female with PMH of HTN, nephrolithiasis and arthritis who presents for evaluation of severe lower right-sided back pain.  She describes the pain as starting in her back and radiating down her leg.  She states she has had multiple kidney stones before and this pain is different and more severe.  Patient states her pain began last night and thinks it may have been aggravated by picking up her son.  Today she reports she has been unable to walk due to her pain.  She denies changes in bladder and bowel function.  No fevers.  She has tried Robaxin, gabapentin, tramadol and Tylenol at home for pain.     Physical Exam   Triage Vital Signs: ED Triage Vitals  Encounter Vitals Group     BP 03/29/23 1604 116/75     Systolic BP Percentile --      Diastolic BP Percentile --      Pulse Rate 03/29/23 1604 86     Resp 03/29/23 1604 18     Temp 03/29/23 1604 98.6 F (37 C)     Temp Source 03/29/23 1604 Oral     SpO2 03/29/23 1604 98 %     Weight --      Height --      Head Circumference --      Peak Flow --      Pain Score 03/29/23 1608 10     Pain Loc --      Pain Education --      Exclude from Growth Chart --     Most recent vital signs: Vitals:   03/29/23 1604 03/29/23 2105  BP: 116/75 110/72  Pulse: 86 (!) 101  Resp: 18 18  Temp: 98.6 F (37 C) 98.2 F (36.8 C)  SpO2: 98% 96%    General: Awake, moderate distress.  Patient unable to get comfortable in bed. CV:  Good peripheral perfusion.  Resp:  Normal effort.  Abd:  No distention.  Other:  Tender to palpation in right lower back, negative straight leg raise.  Patient difficult to examine due to the severity of her pain.   ED Results / Procedures / Treatments   Labs (all labs ordered are listed, but only abnormal  results are displayed) Labs Reviewed - No data to display  RADIOLOGY  MRI of the lumbar spine without contrast ordered, I interpreted the images as well as reviewed the radiologist report.   PROCEDURES:  Critical Care performed: No  Procedures   MEDICATIONS ORDERED IN ED: Medications  lidocaine (LIDODERM) 5 % 1 patch (has no administration in time range)  oxyCODONE-acetaminophen (PERCOCET/ROXICET) 5-325 MG per tablet 1 tablet (1 tablet Oral Given 03/29/23 1735)  fentaNYL (SUBLIMAZE) injection 50 mcg (50 mcg Intravenous Given 03/29/23 2053)  ketorolac (TORADOL) 30 MG/ML injection 30 mg (30 mg Intravenous Given 03/29/23 2054)  predniSONE (DELTASONE) tablet 60 mg (60 mg Oral Given 03/29/23 2104)     IMPRESSION / MDM / ASSESSMENT AND PLAN / ED COURSE  I reviewed the triage vital signs and the nursing notes.                              Differential diagnosis includes, but  is not limited to, cauda equina, muscle strain, disc herniation, degenerative joint disease.  Patient's presentation is most consistent with acute complicated illness / injury requiring diagnostic workup.  I spoke with Dr. Vicente Males about how to manage this patient's pain, and since she is no longer able to walk he felt it appropriate for patient to get an MRI.  I was in agreement.  I interpreted the images as well as reviewed the radiologist report.  Patient has a grade 1 anterior listhesis of L4 on L5 with severe narrowing of the bilateral recesses and moderate to severe bilateral neural foraminal narrowing at this level.  There is asymmetric soft tissue edema along the right lateral aspect of the L4-L5 facet joint.  5 x 4 mm fluid collection adjacent to the right-sided facet joint which likely represents an extraspinal synovial cyst. Her bladder is distended.  MRI confirmed my suspicion that her pain is musculoskeletal and inflammatory in nature.  Patient was given Percocet while in the ED which she stated has not  improved her pain.   I discussed pain management with Dr. Vicente Males, and the patient.  Since she is not responding to the Percocet and has tried multiple other pain medications, I gave her the option to try IV narcotics or be discharged home with steroids in addition to muscle relaxer and anti-inflammatory. If she did not get pain relief from this we would move forward with an inpatient admission for intractable back pain.  Patient elected to try IV pain medications as she did not want to be discharged without getting her pain under control in the ED.  IV was started to give patient IV narcotics.  She was given fentanyl, Toradol and p.o. prednisone.  Patient did report an improvement in her symptoms after these medications.  I will send patient home with 5 days of prednisone and Flexeril.  I also advised patient to apply topical pain relievers as needed.  She can ice and heat as needed.  Patient has a orthopedic doctor that she is an established relationship with and I advised her to follow-up with them.       FINAL CLINICAL IMPRESSION(S) / ED DIAGNOSES   Final diagnoses:  Acute right-sided low back pain with right-sided sciatica     Rx / DC Orders   ED Discharge Orders          Ordered    predniSONE (DELTASONE) 50 MG tablet        03/29/23 2246    cyclobenzaprine (FLEXERIL) 10 MG tablet  3 times daily PRN        03/29/23 2246             Note:  This document was prepared using Dragon voice recognition software and may include unintentional dictation errors.   Cameron Ali, PA-C 03/29/23 2255    Merwyn Katos, MD 03/29/23 [phone]

## 2023-03-29 NOTE — ED Triage Notes (Signed)
Pt states she has arthritis in her back- c/o severe lower right sided back pain that started last night was not relieved by gabapentin and robaxin at home today. Pt reports picking her son up yesterday as possible aggravating factor. Pt states she was unable to walk today due to the pain, denies urinary s/s.

## 2023-03-29 NOTE — Discharge Instructions (Addendum)
Your MRI results showed arthritis and inflammation.  Please take the prednisone once a day for 5 days.  You can take the Flexeril, which is a muscle relaxer, 3 times daily as needed for muscle spasms.  Do not drive while taking this medication as it can make you sleepy.  You can also take Tylenol, 650 mg every 6 hours as needed for pain. You can apply topical pain relievers like lidocaine patches, IcyHot and Biofreeze.  You can also use a heating pad and ice packs.  Please follow-up with your orthopedic doctor.

## 2023-04-01 ENCOUNTER — Encounter: Payer: Self-pay | Admitting: Internal Medicine

## 2023-04-01 ENCOUNTER — Ambulatory Visit: Payer: BC Managed Care – PPO | Admitting: Internal Medicine

## 2023-04-01 ENCOUNTER — Telehealth: Payer: Self-pay | Admitting: Internal Medicine

## 2023-04-01 VITALS — BP 116/84 | HR 84 | Temp 98.2°F | Ht 62.0 in | Wt 177.4 lb

## 2023-04-01 DIAGNOSIS — D72829 Elevated white blood cell count, unspecified: Secondary | ICD-10-CM

## 2023-04-01 DIAGNOSIS — M5387 Other specified dorsopathies, lumbosacral region: Secondary | ICD-10-CM

## 2023-04-01 MED ORDER — ONDANSETRON HCL 4 MG PO TABS: 4.0000 mg | ORAL_TABLET | Freq: Three times a day (TID) | ORAL | 0 refills | Status: DC | PRN

## 2023-04-01 MED ORDER — PREDNISONE 10 MG PO TABS: ORAL_TABLET | ORAL | 0 refills | Status: DC

## 2023-04-01 NOTE — Progress Notes (Signed)
Subjective:  Patient ID: [name], female    DOB: 01-Jun-1971  Age: 52 y.o. MRN: [id number]  CC: {There were no encounter diagnoses. (Refresh or delete this SmartLink)}   HPI TYSHELL BREITLING presents for No chief complaint on file.  Makaelah is a 52 yr old female with chronic neck, shoulder and low back pain secondary to degenerative disk disease who is being seen urgently today at the request of her physiatrist due to leukocytosis noted on August 6 labs in the setting of prednisone use and and abnormal lumbar MRI.   HPI:  Brett was evaluated in the emergency department on Sunday, 03/29/2023 for acute onset of severe back pain with right sided sciatica  prohibiting  ambulation .  an updated MRI was performed with grade 1 anterolisthesis of L4 and L5 moderate to severe foraminal narrowing, and a synovial cyst on the right at L4-5. No labs were done She was sent home with prednisone and Flexeril and to use heat and ice as needed.  At follow up visit with physiatry on August 6 she stated that  she had picked up her son on Friday August 2  without notable pain  but then on Sunday she  woke up unable to  move due to severe pain in the right lower back and buttock.  She is taking the prednisone and Flexeril.   Her pain was moderate to severe at this time. She states that she has been the last 2 days on a heating pad on the couch. . Denies any recent illness denies any fever, chills. No recent ESI (none in>2 years).  Neuromuscular exam was documented as normal by NP Tyler Deis and exam was notable for tenderness in the paraspinus muscles.    NP Tyler Deis noted  leukocytosis with normal ESR; however these were drawn at her office on August 6 (and patient had taken 50 mg daily for 2 days prior to the blood draw  She has had urinary frequency for the last several days and mild nausea . No flank pain or gross hematuria       Outpatient Medications Prior to Visit  Medication Sig Dispense Refill   buPROPion  (WELLBUTRIN XL) 300 MG 24 hr tablet TAKE 1 TABLET(300 MG) BY MOUTH DAILY 90 tablet 1   cyclobenzaprine (FLEXERIL) 10 MG tablet Take 1 tablet (10 mg total) by mouth 3 (three) times daily as needed for muscle spasms. 30 tablet 0   hydrochlorothiazide (HYDRODIURIL) 25 MG tablet TAKE 1 TABLET(25 MG) BY MOUTH DAILY 90 tablet 1   levonorgestrel (MIRENA) 20 MCG/24HR IUD 1 each by Intrauterine route once.     PARoxetine (PAXIL) 10 MG tablet TAKE 1 TABLET(10 MG) BY MOUTH DAILY 90 tablet 1   predniSONE (DELTASONE) 50 MG tablet Take one tablet for 5 days. 5 tablet 0   No facility-administered medications prior to visit.    Review of Systems;  Patient denies headache, fevers, malaise, unintentional weight loss, skin rash, eye pain, sinus congestion and sinus pain, sore throat, dysphagia,  hemoptysis , cough, dyspnea, wheezing, chest pain, palpitations, orthopnea, edema, abdominal pain, nausea, melena, diarrhea, constipation, flank pain, dysuria, hematuria, urinary  Frequency, nocturia, numbness, tingling, seizures,  Focal weakness, Loss of consciousness,  Tremor, insomnia, depression, anxiety, and suicidal ideation.      Objective:  There were no vitals taken for this visit.  BP Readings from Last 3 Encounters:  03/29/23 112/74  01/20/23 118/80  10/09/22 118/78    Wt Readings from Last 3  Encounters:  01/20/23 178 lb (80.7 kg)  09/26/22 186 lb (84.4 kg)  03/25/22 185 lb 9.6 oz (84.2 kg)    Physical Exam  Lab Results  Component Value Date   HGBA1C 5.9 10/09/2022   HGBA1C 5.3 06/09/2018   HGBA1C 5.4 06/14/2015    Lab Results  Component Value Date   CREATININE 0.88 10/09/2022   CREATININE 0.80 03/25/2022   CREATININE 0.81 08/30/2021    Lab Results  Component Value Date   WBC 6.5 10/09/2022   HGB 13.0 10/09/2022   HCT 37.4 10/09/2022   PLT 262.0 10/09/2022   GLUCOSE 136 (H) 10/09/2022   CHOL 190 10/09/2022   TRIG (H) 10/09/2022    682.0 Triglyceride is over 400; calculations on  Lipids are invalid.   HDL 38.20 (L) 10/09/2022   LDLDIRECT 110.0 10/09/2022   LDLCALC 128 (H) 08/30/2021   ALT 12 10/09/2022   AST 13 10/09/2022   NA 139 10/09/2022   K 3.5 10/09/2022   CL 103 10/09/2022   CREATININE 0.88 10/09/2022   BUN 20 10/09/2022   CO2 28 10/09/2022   TSH 1.26 10/09/2022   HGBA1C 5.9 10/09/2022   MICROALBUR <0.7 10/09/2022    MR Lumbar Spine Wo Contrast  Result Date: 03/29/2023 CLINICAL DATA:  Low back pain, cauda equina syndrome suspected EXAM: MRI LUMBAR SPINE WITHOUT CONTRAST TECHNIQUE: Multiplanar, multisequence MR imaging of the lumbar spine was performed. No intravenous contrast was administered. COMPARISON:  Lumbar spine MRI 06/16/2018 FINDINGS: Segmentation:  Standard. Alignment:  Grade 1 anterolisthesis of L4 on L5. Vertebrae:  No fracture, evidence of discitis, or bone lesion. Conus medullaris and cauda equina: Conus extends to the L1-L2 disc space level. Conus and cauda equina appear normal. Paraspinal and other soft tissues: Urinary bladder is distended. There is asymmetric soft tissue edema along the right lateral aspect of the L4-L5 facet joint (series 6, image 4). There is a 5 x 4 mm fluid collection adjacent to the right-sided facet joint, which could represent an extra-spinal synovial cyst. Disc levels: T12-L1: Mild bilateral facet degenerative change. No spinal canal or neural foraminal stenosis. L1-L2: Mild bilateral facet degenerative change. Minimal disc bulge. No spinal canal or neural foraminal stenosis. L2-L3: Mild bilateral facet degenerative change. No significant disc bulge. No spinal canal or neural foraminal stenosis. L3-L4: Moderate bilateral facet degenerative change. No significant disc bulge. No spinal canal stenosis. Mild bilateral neural foraminal stenosis. L4-L5: Grade 1 anterolisthesis, new from prior exam. Severe bilateral facet degenerative change. Severe narrowing of the bilateral lateral recesses. Moderate to severe bilateral neural  foraminal narrowing. L5-S1: Circumferential disc bulge. Mild bilateral facet degenerative change. Mild spinal canal narrowing. Mild bilateral neural foraminal narrowing. IMPRESSION: 1. Grade 1 anterolisthesis of L4 on L5, new from prior exam, with severe narrowing of the bilateral lateral recesses and moderate to severe bilateral neural foraminal narrowing at this level. 2. Asymmetric soft tissue edema along the right lateral aspect of the L4-L5 facet joint, which is nonspecific but may be degenerative in etiology. Correlation with ESR and CRP is recommended to exclude the possibility infection. If the inflammatory markers are elevated, further evaluation with contrast-enhanced lumbar spine MRI could be considered to assess for the possibility of septic arthritis. There is a 5 x 4 mm fluid collection adjacent to the right-sided facet joint, which likely represents an extra-spinal synovial cyst. 3. Distended urinary bladder. Electronically Signed   By: Lorenza Cambridge M.D.   On: 03/29/2023 19:42    Assessment & Plan:  .There are  no diagnoses linked to this encounter.   I provided 30 minutes of face-to-face time during this encounter reviewing patient's last visit with me, patient's  most recent visit with cardiology,  nephrology,  and neurology,  recent surgical and non surgical procedures, previous  labs and imaging studies, counseling on currently addressed issues,  and post visit ordering to diagnostics and therapeutics .   Follow-up: No follow-ups on file.   Sherlene Shams, MD

## 2023-04-01 NOTE — Progress Notes (Unsigned)
Referring Physician:  Sherlene Shams, MD [address] [place],  [place] [address]  Primary Physician:  Sherlene Shams, MD  History of Present Illness: 04/02/2023 Ariana Weeks is here today with a chief complaint of low back pain radiating mostly into the right buttocks and groin.  She states that this is gotten significantly worse over the past few months.  And has progressed significantly especially over the past week.  She is not having any noticeable weakness or numbness.  States this is mostly sharp pain.  Worsened when she is lifting or bending.  She has not noticed any positions that give her considerable relief.  She does have a history of back pain and has had previous lumbar spinal injections.  Had a previous L5-S1 disc herniation which was treated conservatively with TFESI.  She was recently seen in pain clinic, there is some concern for possible septic arthritis of the facet joint so inflammatory markers were sent, she had a slightly elevated WBC but is on steroids.  Her sed rate was normal.  She has not had any fevers or constitutional symptoms.  No bowel or bladder signs or symptoms of cauda equina syndrome.  Conservative measures:  Physical therapy:denies  Multimodal medical therapy including regular antiinflammatories: Tylenol, Prednisone, Flexeril Injections:  01/04/2021: Left S1 trigger point injection (moderate benefit) 04/27/2019: Left S1 transforaminal ESI (mild benefit)  07/06/2018: Left S1 transforaminal ESI (approximately 80% relief good relief of lower extremity pain) 07/12/2018: Left S1 transforaminal ESI (40% relief for 2 weeks)   The symptoms are causing a significant impact on the patient's life.   I have utilized the care everywhere function in epic to review the outside records available from external health systems.  Review of Systems:  A 10 point review of systems is negative, except for the pertinent positives and negatives detailed in  the HPI.  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 Lemar Livings   Sleep apnea    no- c-pap   Supervision of high risk pregnancy, antepartum 05/18/2014   Formatting of this note might be different from the original. Case discussed at Peds/Perinatal multidisciplinary conf 05/18/2014. Aortic atresia/mitral stenosis Delivery plan: repeat c-section at 39 weeks    Past Surgical History: Past Surgical History:  Procedure Laterality Date   BREAST BIOPSY Right 02/02/2013   [place] bx/clip-neg   BREAST BIOPSY Right 02/19/2017   affirm, ATYPICAL LOBULAR HYPERPLASIA   BREAST BIOPSY Right 03/04/2017   Procedure: BREAST BIOPSY WITH NEEDLE LOCALIZATION;  Surgeon: Earline Mayotte, MD;  Location: [facility];  Service: General;  Laterality: Right;   BREAST LUMPECTOMY Right 2018   CESAREAN SECTION  2005 2015   CHOLECYSTECTOMY  09/2012   CYSTOSCOPY W/ RETROGRADES N/A 02/02/2017   Procedure: CYSTOSCOPY WITH RETROGRADE PYELOGRAM;  Surgeon: Bjorn Pippin, MD;  Location: [facility];  Service: Urology;  Laterality: N/A;   CYSTOSCOPY/URETEROSCOPY/HOLMIUM LASER/STENT PLACEMENT Left 07/10/2020   Procedure: CYSTOSCOPY/URETEROSCOPY/HOLMIUM LASER/STENT  PLACEMENT;  Surgeon: Riki Altes, MD;  Location: [facility];  Service: Urology;  Laterality: Left;   EXTRACORPOREAL SHOCK WAVE LITHOTRIPSY  2009   INTRAUTERINE DEVICE INSERTION  MARCH 2012   MURINA   LAPAROSCOPIC GASTRIC SLEEVE RESECTION N/A 08/09/2019   Procedure: LAPAROSCOPIC GASTRIC SLEEVE RESECTION, Upper Endo, ERAS [place];  Surgeon: Luretha Murphy, MD;   Location: [facility];  Service: General;  Laterality: N/A;   URETERAL STENT PLACEMENT  1998  (APPROX)   Kidney Stone   URETEROSCOPY WITH HOLMIUM LASER LITHOTRIPSY Left 02/02/2017   Procedure: URETEROSCOPY WITH HOLMIUM LASER LITHOTRIPSY;  Surgeon: Bjorn Pippin, MD;  Location: [facility];  Service: Urology;  Laterality: Left;    Allergies: Allergies as of 04/02/2023 - Review Complete 04/02/2023  Allergen Reaction Noted   Dilaudid [hydromorphone hcl] Itching 06/14/2015   Azithromycin  04/12/2014   Nitrofurantoin Hives 01/29/2004   Morphine and codeine Hives and Rash 06/14/2015    Medications:  Current Outpatient Medications:    buPROPion (WELLBUTRIN XL) 300 MG 24 hr tablet, TAKE 1 TABLET(300 MG) BY MOUTH DAILY, Disp: 90 tablet, Rfl: 1   cyclobenzaprine (FLEXERIL) 10 MG tablet, Take 1 tablet (10 mg total) by mouth 3 (three) times daily as needed for muscle spasms., Disp: 30 tablet, Rfl: 0   hydrochlorothiazide (HYDRODIURIL) 25 MG tablet, TAKE 1 TABLET(25 MG) BY MOUTH DAILY, Disp: 90 tablet, Rfl: 1   HYDROcodone-acetaminophen (NORCO/VICODIN) 5-325 MG tablet, Take 1 tablet by mouth 2 (two) times daily as needed., Disp: , Rfl:    levonorgestrel (MIRENA) 20 MCG/24HR IUD, 1 each by Intrauterine route once., Disp: , Rfl:    ondansetron (ZOFRAN) 4 MG tablet, Take 1 tablet (4 mg total) by mouth every 8 (eight) hours as needed for nausea or vomiting., Disp: 20 tablet, Rfl: 0   PARoxetine (PAXIL) 10 MG tablet, TAKE 1 TABLET(10 MG) BY MOUTH DAILY, Disp: 90 tablet, Rfl: 1   predniSONE (DELTASONE) 10 MG tablet, 6 tablets on Day 1 , then reduce by 1 tablet daily until gone, Disp: 21 tablet, Rfl: 0   predniSONE (DELTASONE) 50 MG tablet, Take one tablet for 5 days., Disp: 5 tablet, Rfl: 0  Social History: Social History   Tobacco Use   Smoking status: Former    Current packs/day: 0.00    Average packs/day: 0.5 packs/day for 16.0 years (8.0 ttl pk-yrs)    Types: Cigarettes    Start date: 06/25/1998    Quit  date: 06/25/2014    Years since quitting: 8.7   Smokeless tobacco: Never  Vaping Use   Vaping status: Never Used  Substance Use Topics   Alcohol use: Not Currently    Alcohol/week: 0.0 standard drinks of alcohol   Drug use: No    Family Medical History: Family History  Problem Relation Age of Onset   Lung cancer Father    Other Mother        breast cyst   Breast cancer Mother 62       tested   Alcohol abuse Maternal Uncle    Alcohol abuse Paternal Uncle    Ovarian cancer Maternal Grandmother 61   Lymphoma Maternal Grandfather        cancer   Breast cancer Maternal Aunt 67       Great Aunt, late 61s   Alcohol abuse Paternal Grandfather     Physical Examination: Vitals:   04/02/23 0954  BP: 130/82    General: Patient is in no apparent distress. Attention to examination is appropriate.  Neck:  Supple.  Full range of motion.  Respiratory: Patient is breathing without any difficulty.   NEUROLOGICAL:     Awake, alert, oriented to person, place, and time.  Speech is clear and fluent.   Cranial Nerves: Pupils equal round and reactive to light.  Facial tone is symmetric.  Facial sensation is symmetric. Shoulder shrug is symmetric. Tongue protrusion is midline.    Strength:  Side Iliopsoas Quads Hamstring PF DF EHL  R 5 5 5 5 5 5   L 5 5 5 5 5 5    Reflexes are 2+ and symmetric at the biceps, triceps, brachioradialis, patella and achilles.   Hoffman's is absent. Clonus is absent  Bilateral upper and lower extremity sensation is intact to light touch  Gait is normal.    Imaging: Narrative & Impression  CLINICAL DATA:  Low back pain, cauda equina syndrome suspected   EXAM: MRI LUMBAR SPINE WITHOUT CONTRAST   TECHNIQUE: Multiplanar, multisequence MR imaging of the lumbar spine was performed. No intravenous contrast was administered.   COMPARISON:  Lumbar spine MRI 06/16/2018   FINDINGS: Segmentation:  Standard.   Alignment:  Grade 1 anterolisthesis of L4  on L5.   Vertebrae:  No fracture, evidence of discitis, or bone lesion.   Conus medullaris and cauda equina: Conus extends to the L1-L2 disc space level. Conus and cauda equina appear normal.   Paraspinal and other soft tissues: Urinary bladder is distended. There is asymmetric soft tissue edema along the right lateral aspect of the L4-L5 facet joint (series 6, image 4). There is a 5 x 4 mm fluid collection adjacent to the right-sided facet joint, which could represent an extra-spinal synovial cyst.   Disc levels:   T12-L1: Mild bilateral facet degenerative change. No spinal canal or neural foraminal stenosis.   L1-L2: Mild bilateral facet degenerative change. Minimal disc bulge. No spinal canal or neural foraminal stenosis.   L2-L3: Mild bilateral facet degenerative change. No significant disc bulge. No spinal canal or neural foraminal stenosis.   L3-L4: Moderate bilateral facet degenerative change. No significant disc bulge. No spinal canal stenosis. Mild bilateral neural foraminal stenosis.   L4-L5: Grade 1 anterolisthesis, new from prior exam. Severe bilateral facet degenerative change. Severe narrowing of the bilateral lateral recesses. Moderate to severe bilateral neural foraminal narrowing.   L5-S1: Circumferential disc bulge. Mild bilateral facet degenerative change. Mild spinal canal narrowing. Mild bilateral neural foraminal narrowing.   IMPRESSION: 1. Grade 1 anterolisthesis of L4 on L5, new from prior exam, with severe narrowing of the bilateral lateral recesses and moderate to severe bilateral neural foraminal narrowing at this level. 2. Asymmetric soft tissue edema along the right lateral aspect of the L4-L5 facet joint, which is nonspecific but may be degenerative in etiology. Correlation with ESR and CRP is recommended to exclude the possibility infection. If the inflammatory markers are elevated, further evaluation with contrast-enhanced lumbar spine MRI  could be considered to assess for the possibility of septic arthritis. There is a 5 x 4 mm fluid collection adjacent to the right-sided facet joint, which likely represents an extra-spinal synovial cyst. 3. Distended urinary bladder.     Electronically Signed   By: Lorenza Cambridge M.D.   On: 03/29/2023 19:42    I have personally reviewed the images and agree with the above interpretation.  Medical Decision Making/Assessment and Plan: Ariana Weeks is a pleasant 52 y.o. female with with a spondylolisthesis at L4-5.  She has had worsening back pain over the past few months that  has been exacerbated over the past week.  Recently seen by her pain team who evaluated her with an MRI found to have a new slip at L4-5 with severe reactive joint changes.  There is some question on whether or not this could be an infectious process, the sed rate was normal, she did have a mildly elevated white blood cell count but is on prednisone.  She has not had any fevers.  However some of the imaging characteristics raised concern for possible infection and requested a MRI with and without contrast to evaluate.  From a degenerative spondylolisthesis standpoint, we feel that her back and buttocks pain is likely a result of this spondylolisthesis at L4-5.  At this time she does have good strength and sensation however.  We like to exhaust conservative options prior to discussing any plans for surgical intervention.  Will plan to get upright x-rays with flexion-extension films to evaluate how mobile this listhesis is.  We also recommend that should the MRI be negative infection that she would likely benefit from injection therapy.  Also she has not yet started physical therapy so we have made a referral as well.  Would like to follow her up in approximately 2 to 3 months to make sure that she is moving in a good direction.  She was instructed on all the red flag signs and symptoms to present earlier for.  Thank you for  involving me in the care of this patient.    Lovenia Kim MD/MSCR Neurosurgery

## 2023-04-01 NOTE — Patient Instructions (Signed)
Finish the current prednisone prescription, then start the 6 day taper I have sent in  Check mychart for urine results

## 2023-04-01 NOTE — Telephone Encounter (Signed)
Patient called and wanted to know why she needs to schedule an appointment today with Dr. Darrick Huntsman. She would like for someone to call her.

## 2023-04-02 ENCOUNTER — Ambulatory Visit
Admission: RE | Admit: 2023-04-02 | Discharge: 2023-04-02 | Disposition: A | Discharge status: Home / Self Care | Payer: BC Managed Care – PPO | Attending: Neurosurgery | Admitting: Neurosurgery

## 2023-04-02 ENCOUNTER — Encounter: Payer: Self-pay | Admitting: Neurosurgery

## 2023-04-02 ENCOUNTER — Ambulatory Visit
Admission: RE | Admit: 2023-04-02 | Discharge: 2023-04-02 | Disposition: A | Discharge status: Home / Self Care | Payer: BC Managed Care – PPO | Source: Ambulatory Visit | Attending: Neurosurgery | Admitting: Neurosurgery

## 2023-04-02 ENCOUNTER — Ambulatory Visit (INDEPENDENT_AMBULATORY_CARE_PROVIDER_SITE_OTHER): Payer: BC Managed Care – PPO | Admitting: Neurosurgery

## 2023-04-02 VITALS — BP 130/82 | Ht 62.0 in | Wt 179.2 lb

## 2023-04-02 DIAGNOSIS — M5387 Other specified dorsopathies, lumbosacral region: Secondary | ICD-10-CM | POA: Insufficient documentation

## 2023-04-02 DIAGNOSIS — D72829 Elevated white blood cell count, unspecified: Secondary | ICD-10-CM | POA: Insufficient documentation

## 2023-04-02 DIAGNOSIS — M4316 Spondylolisthesis, lumbar region: Secondary | ICD-10-CM | POA: Diagnosis not present

## 2023-04-02 NOTE — Patient Instructions (Signed)
LOCAL PHYSICAL THERAPY  [facility]  [address]  [place], [place] [address]  [phone]  [facility]  [address] [place], [place] [address]  [phone]  [facility] (2 locations)  [address]  [place], [place] [address]  [phone]          or  [address]  [place], [place] [address]  [phone]  [facility]  [address]  [place], [place] [address]  [phone]  **dry needling**  [facility])  [address]  [place], [place] [address]  [phone]  Fax: [phone]  ** Aquatic therapy[address] [place], [place] [address] [phone] **Aquatic therapy**  [facility]  [facility]  [address]  [place], [place] [address]  [phone]  [facility]  [address]  [place], [place] [address]  [phone]  [facility]  [address]   Janora Norlander  [place], [place] [address]  [phone]  [facility]  [address]  [place], [place] [address]  [phone]  **dry needling**   PELVIC FLOOR/SI JOINT  [facility]  Mariane Masters, PT  [email or web address]   [place]  [facility]  [address]  Suite [place], [place] [address]  [phone]   [facility] [place]  [address], [place] [address]  [phone]   [facility], [place]  [facility]  Raymond Gurney, PT  [address]  [place], [place] [address] [phone]   Samara Deist  [facility]  [address]  [phone]   [facility]  [address]  [phone]   [facility]  Annamaria Boots [facility]  [address]  [place], [place] [address]  [phone]   **No Aetna or medicaid**  [facility]  [phone] [address] [place] [address]  [phone]  [facility]  [address]  [place], [place] [address]  [phone]  Dannial Monarch, DC  [address], [place] [address]  [phone]  [facility]  [address], [place] [address]  [phone]  [facility]  [address]  [place], [place] [address]  [phone]  [facility]  (3 locations)  [address]  [place], [place] [address]  [phone]  **dry needling**           or  [address] [place], [place] [address]  [phone]  **Additionally has Gloris Manchester, OT**           or  [address]  [place], [place] [address]  [phone]  **Pediatric therapy**  [facility]  [address]  [phone]  **dry needlingVerdie Drown [facility]  [address]  [place], [place] [address]  [phone]  [facility]   (Inside [address] Fitness)  [address]  [place], [place] [address]  [phone]  **dry needling**  **MEDICAID or UNINSURED** [facility] dept. Of Physical Therapy [place], [place] [address] [phone]  [facility]  [address] [place], [place] [address]  [phone]   [facility]  [address]  [place], [place] [address]  [phone]   Doreatha Martin  [facility]  [address] [place], [place] [address]  [phone]   [facility]  [address]  [place], [place] [address]  [phone]   [facility]  [address] [place], [place] [address]  [phone]  [facility]  [address]  [place], [place] [address]  [phone]  AQUATIC  Kathalene Frames [facility]  [employer]  [facility]  [place]  [place]  *Residents only*   [facility] [employer]  *[facility]  Exercise class  [facility]  Exercise class  [facility]  [address]  [place], [place]   [phone]  [facility]  [address]  [place], [place] [address]  [phone]   [place], [place]  [facility]  [address]  [phone]   [facility]  [facility]  [address]  [phone]           or  [address]  [phone]   [facility] Support [facility]   Provides education and support and practical information for coping with arthritis for arthritis sufferers and their families.   When: 12:15 - 1:30 p.m. the second Monday of each month, March through December  Info: Call Rehabilitation Services at [phone]

## 2023-04-02 NOTE — Assessment & Plan Note (Signed)
Likely secondary to prednisone use  for lumbar radiculitis (started 2 days prior to CBC draw) , but will rule out UTI

## 2023-04-02 NOTE — Telephone Encounter (Signed)
Pt was called yesterday to discuss the need for the appt.

## 2023-04-02 NOTE — Assessment & Plan Note (Signed)
Recent aggravation of pain after dead lifting her 70 lb special needs son .  Mri repeated,  nothing to suggest diskitis.  Small fluid collection  4 x 5 mm noted ;  to see neurosurgery to consider aspiration /biopsy of area per physiatry

## 2023-04-03 ENCOUNTER — Telehealth: Payer: Self-pay | Admitting: Internal Medicine

## 2023-04-03 NOTE — Telephone Encounter (Signed)
Pt is requesting lab results.

## 2023-04-03 NOTE — Telephone Encounter (Signed)
Pt called in stating if Dr. Darrick Huntsman can give her a call when her results are back. Pt stated she does not understand the MyChart results.

## 2023-04-06 NOTE — Telephone Encounter (Signed)
Pt is aware of results notes.

## 2023-04-09 ENCOUNTER — Ambulatory Visit
Admission: RE | Admit: 2023-04-09 | Discharge: 2023-04-09 | Disposition: A | Discharge status: Home / Self Care | Payer: BC Managed Care – PPO | Source: Ambulatory Visit | Attending: Neurosurgery | Admitting: Neurosurgery

## 2023-04-09 DIAGNOSIS — M5387 Other specified dorsopathies, lumbosacral region: Secondary | ICD-10-CM | POA: Diagnosis present

## 2023-04-09 MED ORDER — GADOBUTROL 1 MMOL/ML IV SOLN
8.0000 mL | Freq: Once | INTRAVENOUS | Status: AC | PRN
  Administered 2023-04-09: 8 mL via INTRAVENOUS

## 2023-04-14 ENCOUNTER — Telehealth: Payer: Self-pay | Admitting: Internal Medicine

## 2023-04-14 ENCOUNTER — Other Ambulatory Visit: Payer: Self-pay | Admitting: Internal Medicine

## 2023-04-14 MED ORDER — HYDROCODONE-ACETAMINOPHEN 5-325 MG PO TABS: 1.0000 | ORAL_TABLET | Freq: Two times a day (BID) | ORAL | 0 refills | Status: DC | PRN

## 2023-04-14 NOTE — Telephone Encounter (Signed)
Requesting: Hydrocodone Contract: No UDS: No Last Visit: 04/01/2023 Next Visit: Not scheduled Last Refill: 03/31/2023  Please Advise

## 2023-04-14 NOTE — Telephone Encounter (Signed)
Prescription Request  04/14/2023  LOV: 04/01/2023  What is the name of the medication or equipment? HYDROcodone-acetaminophen (NORCO/VICODIN) 5-325 MG tablet  Have you contacted your pharmacy to request a refill? Yes   Which pharmacy would you like this sent to?   [id number] [place], [place] - [address] AT [facility] [address] [place] [address] Phone: [phone] Fax: [phone]    Patient notified that their request is being sent to the clinical staff for review and that they should receive a response within 2 business days.   Please advise at Mobile [phone] (mobile)

## 2023-04-15 NOTE — Telephone Encounter (Signed)
noted 

## 2023-04-17 ENCOUNTER — Ambulatory Visit
Admission: RE | Admit: 2023-04-17 | Discharge: 2023-04-17 | Disposition: A | Discharge status: Home / Self Care | Payer: BC Managed Care – PPO | Source: Ambulatory Visit | Attending: General Surgery | Admitting: General Surgery

## 2023-04-17 DIAGNOSIS — Z1231 Encounter for screening mammogram for malignant neoplasm of breast: Secondary | ICD-10-CM | POA: Insufficient documentation

## 2023-05-21 ENCOUNTER — Encounter: Payer: Self-pay | Admitting: Obstetrics and Gynecology

## 2023-05-21 ENCOUNTER — Ambulatory Visit: Payer: BC Managed Care – PPO | Admitting: Obstetrics and Gynecology

## 2023-05-21 VITALS — BP 92/60 | HR 87 | Ht 62.0 in | Wt 171.0 lb

## 2023-05-21 DIAGNOSIS — Z30011 Encounter for initial prescription of contraceptive pills: Secondary | ICD-10-CM

## 2023-05-21 DIAGNOSIS — L710 Perioral dermatitis: Secondary | ICD-10-CM

## 2023-05-21 DIAGNOSIS — Z30432 Encounter for removal of intrauterine contraceptive device: Secondary | ICD-10-CM

## 2023-05-21 MED ORDER — NORETHINDRONE 0.35 MG PO TABS
1.0000 | ORAL_TABLET | Freq: Every day | ORAL | 2 refills | Status: DC
Start: 2023-05-21 — End: 2024-01-26

## 2023-05-21 MED ORDER — METRONIDAZOLE 1 % EX GEL
Freq: Every day | CUTANEOUS | 0 refills | Status: DC
Start: 2023-05-21 — End: 2023-09-09

## 2023-05-21 NOTE — Progress Notes (Signed)
Chief Complaint  Patient presents with   Contraception    IUD removal, not interested in replacing it.     History of Present Illness:  Ariana Weeks is a 52 y.o. that had a Mirena IUD placed 09/25/14. Menses had been absent with random spotting occas; now monthly for the past few months, lasting 4-5 days, light flow, no BTB, mild dysmen. Would like IUD removed and try POPs. Doesn't want to get pregnant. On hydrochlorothiazide for swelling for many yrs, no hx of HTN, but has never gone off it to see if she has HTN dx.  Having issues with rash around mouth for a few months (upper lip RT>LT and full chin). Son rubs her face/mouth regularly. Uses dove sens skin to wash, no lotion at night. Rash is papular with crusts, burns sometimes. Not acne. Has derm appt 11/24.   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 Lemar Livings   Sleep apnea    no- c-pap   Supervision of high risk pregnancy, antepartum 05/18/2014   Formatting of this note might be different from the original. Case discussed at Peds/Perinatal multidisciplinary conf 05/18/2014. Aortic atresia/mitral stenosis Delivery plan: repeat c-section at 39 weeks   Past Surgical History:  Procedure Laterality Date   BREAST BIOPSY Right 02/02/2013   [place] bx/clip-neg   BREAST BIOPSY Right 02/19/2017   affirm, ATYPICAL LOBULAR HYPERPLASIA   BREAST BIOPSY Right 03/04/2017   Procedure: BREAST BIOPSY WITH NEEDLE LOCALIZATION;  Surgeon: Earline Mayotte, MD;  Location: [facility];  Service: General;  Laterality: Right;   BREAST LUMPECTOMY Right 2018   CESAREAN SECTION   2005 2015   CHOLECYSTECTOMY  09/2012   CYSTOSCOPY W/ RETROGRADES N/A 02/02/2017   Procedure: CYSTOSCOPY WITH RETROGRADE PYELOGRAM;  Surgeon: Bjorn Pippin, MD;  Location: [facility];  Service: Urology;  Laterality: N/A;   CYSTOSCOPY/URETEROSCOPY/HOLMIUM LASER/STENT PLACEMENT Left 07/10/2020   Procedure: CYSTOSCOPY/URETEROSCOPY/HOLMIUM LASER/STENT PLACEMENT;  Surgeon: Riki Altes, MD;  Location: [facility];  Service: Urology;  Laterality: Left;   EXTRACORPOREAL SHOCK WAVE LITHOTRIPSY  2009   INTRAUTERINE DEVICE INSERTION  MARCH 2012   MURINA   LAPAROSCOPIC GASTRIC SLEEVE RESECTION N/A 08/09/2019   Procedure: LAPAROSCOPIC GASTRIC SLEEVE RESECTION, Upper Endo, ERAS Path[place];  Surgeon: Luretha Murphy, MD;  [facility];  Service: General;  Laterality: N/A;   URETERAL STENT PLACEMENT  1998  (APPROX)   Kidney Stone   URETEROSCOPY WITH HOLMIUM LASER LITHOTRIPSY Left 02/02/2017   Procedure: URETEROSCOPY WITH HOLMIUM LASER LITHOTRIPSY;  Surgeon: Bjorn Pippin, MD;  Location: [facility];  Service: Urology;  Laterality: Left;   Family History  Problem Relation Age of Onset   Lung cancer Father    Other Mother        breast cyst   Breast cancer Mother 37       tested   Alcohol abuse Maternal Uncle    Alcohol abuse Paternal Uncle    Ovarian cancer Maternal Grandmother 37   Lymphoma  Maternal Grandfather        cancer   Breast cancer Maternal Aunt 29       Great Aunt, late 57s   Alcohol abuse Paternal Grandfather        BP 92/60   Pulse 87   Ht 5\' 2"  (1.575 m)   Wt 171 lb (77.6 kg)   LMP 05/18/2023 (Approximate)   BMI 31.28 kg/m   Physical Exam Constitutional:      General: She is not in acute distress. Genitourinary:     Vulva normal.     Right Labia: No rash, tenderness or lesions.    Left Labia: No tenderness, lesions or rash.    No vaginal discharge, erythema, tenderness or bleeding.      Right Adnexa: not tender and no mass present.    Left Adnexa: not tender and no mass  present.    No cervical motion tenderness or friability.     IUD strings visualized.     Uterus is not enlarged or tender.  Pulmonary:     Effort: Pulmonary effort is normal.  Musculoskeletal:        General: Normal range of motion.  Neurological:     General: No focal deficit present.     Mental Status: She is alert.     Cranial Nerves: No cranial nerve deficit.  Skin:    General: Skin is warm and dry.     Findings: Rash present. Rash is crusting and papular.       Psychiatric:        Mood and Affect: Mood normal.        Behavior: Behavior normal.        Thought Content: Thought content normal.        Judgment: Judgment normal.  Vitals and nursing note reviewed.     IUD Removal Strings of IUD identified and grasped.  IUD removed without problem with ring forceps.  Pt tolerated this well.  IUD noted to be intact.  Assessment/Plan:  Encounter for initial prescription of contraceptive pills - Plan: norethindrone (MICRONOR) 0.35 MG tablet; IUD removed, start POPs. Aware of 3-hr-late decreased contraception, although unlikely given her age. Rx eRxd. F/u prn.   Perioral dermatitis - Plan: metroNIDAZOLE (METROGEL) 1 % gel; Rx eRxd. Cetaphil facial cleanser and lotion, f/u with derm 11/24.   Encounter for IUD removal  Meds ordered this encounter  Medications   metroNIDAZOLE (METROGEL) 1 % gel    Sig: Apply topically daily. For 8 weeks    Dispense:  60 g    Refill:  0    Order Specific Question:   Supervising Provider    Answer:   Hildred Laser [[id number]]   norethindrone (MICRONOR) 0.35 MG tablet    Sig: Take 1 tablet (0.35 mg total) by mouth daily.    Dispense:  84 tablet    Refill:  2    Order Specific Question:   Supervising Provider    Answer:   Hildred Laser [[id number]]     Return if symptoms worsen or fail to improve.  Ariana Lyne B. Kimbly Eanes, PA-C 05/21/2023 5:10 PM

## 2023-05-21 NOTE — Patient Instructions (Signed)
I value your feedback and you entrusting us with your care. If you get a [facility] patient survey, I would appreciate you taking the time to let us know about your experience today. Thank you! ? ? ?

## 2023-07-08 ENCOUNTER — Ambulatory Visit: Payer: BC Managed Care – PPO | Admitting: Neurosurgery

## 2023-08-19 ENCOUNTER — Other Ambulatory Visit: Payer: Self-pay | Admitting: Internal Medicine

## 2023-08-29 ENCOUNTER — Other Ambulatory Visit: Payer: Self-pay | Admitting: Internal Medicine

## 2023-09-09 ENCOUNTER — Ambulatory Visit: Payer: Self-pay | Admitting: Neurosurgery

## 2023-09-09 ENCOUNTER — Encounter: Payer: Self-pay | Admitting: Neurosurgery

## 2023-09-09 VITALS — BP 103/63 | Ht 62.0 in | Wt 171.0 lb

## 2023-09-09 DIAGNOSIS — M5416 Radiculopathy, lumbar region: Secondary | ICD-10-CM | POA: Diagnosis not present

## 2023-09-09 DIAGNOSIS — M4316 Spondylolisthesis, lumbar region: Secondary | ICD-10-CM

## 2023-09-09 DIAGNOSIS — M5387 Other specified dorsopathies, lumbosacral region: Secondary | ICD-10-CM

## 2023-09-09 DIAGNOSIS — M48061 Spinal stenosis, lumbar region without neurogenic claudication: Secondary | ICD-10-CM | POA: Diagnosis not present

## 2023-09-09 NOTE — Progress Notes (Signed)
 Referring Physician:  Thersia Flax, MD [address] [place],  [place] [address]  Primary Physician:  Thersia Flax, MD  History of Present Illness: 09/09/2023 Ariana Weeks is here today for follow-up.  We have been following closely for her back pain which has been managed conservatively over the past 5 to 6 years.  When I saw her last she had a significant spondylolisthesis at L4-5 and less severe at L5-S1.  She had severe's positional back pain.  She got better with some conservative care including injections and home exercise.  She was doing well but recently continue to have a worsening exacerbation of her positional back pain as well as her bilateral lower extremity pain.  She feels electric symptoms going into her bilateral buttocks.  She also gets some intermittent numbness.  No weakness noted on her physical exam or per history.  She has no signs of cauda equina syndrome.  She does feel worse when she is upright and better when she is supported.  She improved with a home exercise regimen and did not do formal physical therapy.  Continues to get injections.  Was recently seen for facet injections which did not give her any relief, going now for epidural spinal injections.  She was given a steroid burst as well.  Also takes small amount of gabapentin  nightly.  Conservative measures:  Physical therapy:denies  Multimodal medical therapy including regular antiinflammatories: Tylenol , Prednisone , Flexeril  Injections:  01/04/2021: Left S1 trigger point injection (moderate benefit) 04/27/2019: Left S1 transforaminal ESI (mild benefit)  07/06/2018: Left S1 transforaminal ESI (approximately 80% relief good relief of lower extremity pain) 07/12/2018: Left S1 transforaminal ESI (40% relief for 2 weeks)   The symptoms are causing a significant impact on the patient's life.   I have utilized the care everywhere function in epic to review the outside records available from  external health systems.  Review of Systems:  A 10 point review of systems is negative, except for the pertinent positives and negatives detailed in the HPI.  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 Marquita Situ   Sleep apnea    no- c-pap   Supervision of high risk pregnancy, antepartum 05/18/2014   Formatting of this note might be different from the original. Case discussed at Peds/Perinatal multidisciplinary conf 05/18/2014. Aortic atresia/mitral stenosis Delivery plan: repeat c-section at 39 weeks    Past Surgical History: Past Surgical History:  Procedure Laterality Date   BREAST BIOPSY Right 02/02/2013   us  bx/clip-neg   BREAST BIOPSY Right 02/19/2017   affirm, ATYPICAL LOBULAR HYPERPLASIA   BREAST BIOPSY Right 03/04/2017   Procedure: BREAST BIOPSY WITH NEEDLE LOCALIZATION;  Surgeon: Marshall Skeeter, MD;  Location: [facility];  Service: General;  Laterality: Right;   BREAST LUMPECTOMY Right 2018   CESAREAN SECTION  2005 2015   CHOLECYSTECTOMY  09/2012   CYSTOSCOPY W/ RETROGRADES N/A 02/02/2017   Procedure: CYSTOSCOPY WITH RETROGRADE  PYELOGRAM;  Surgeon: Homero Luster, MD;  Location: [facility];  Service: Urology;  Laterality: N/A;   CYSTOSCOPY/URETEROSCOPY/HOLMIUM LASER/STENT PLACEMENT Left 07/10/2020   Procedure: CYSTOSCOPY/URETEROSCOPY/HOLMIUM LASER/STENT PLACEMENT;  Surgeon: Geraline Knapp, MD;  Location: [facility];  Service: Urology;  Laterality: Left;   EXTRACORPOREAL SHOCK WAVE LITHOTRIPSY  2009   INTRAUTERINE DEVICE INSERTION  MARCH 2012   MURINA   LAPAROSCOPIC GASTRIC  SLEEVE RESECTION N/A 08/09/2019   Procedure: LAPAROSCOPIC GASTRIC SLEEVE RESECTION, Upper Endo, ERAS [place];  Surgeon: Jacolyn Matar, MD;  [facility];  Service: General;  Laterality: N/A;   URETERAL STENT PLACEMENT  1998  (APPROX)   Kidney Stone   URETEROSCOPY WITH HOLMIUM LASER LITHOTRIPSY Left 02/02/2017   Procedure: URETEROSCOPY WITH HOLMIUM LASER LITHOTRIPSY;  Surgeon: Homero Luster, MD;  Location: [facility];  Service: Urology;  Laterality: Left;    Allergies: Allergies as of 09/09/2023 - Review Complete 09/09/2023  Allergen Reaction Noted   Dilaudid  [hydromorphone  hcl] Itching 06/14/2015   Azithromycin   04/12/2014   Nitrofurantoin Hives 01/29/2004   Morphine  and codeine Hives and Rash 06/14/2015    Medications:  Current Outpatient Medications:    buPROPion  (WELLBUTRIN  XL) 300 MG 24 hr tablet, TAKE 1 TABLET(300 MG) BY MOUTH DAILY, Disp: 90 tablet, Rfl: 1   gabapentin  (NEURONTIN ) 100 MG capsule, 2 po qHS, Disp: , Rfl:    hydrochlorothiazide  (HYDRODIURIL ) 25 MG tablet, TAKE 1 TABLET(25 MG) BY MOUTH DAILY, Disp: 90 tablet, Rfl: 1   norethindrone  (MICRONOR ) 0.35 MG tablet, Take 1 tablet (0.35 mg total) by mouth daily., Disp: 84 tablet, Rfl: 2   PARoxetine  (PAXIL ) 10 MG tablet, TAKE 1 TABLET(10 MG) BY MOUTH DAILY, Disp: 90 tablet, Rfl: 1   HYDROcodone -acetaminophen  (NORCO/VICODIN) 5-325 MG tablet, Take 1 tablet by mouth 2 (two) times daily as needed. (Patient not taking: Reported on 09/09/2023), Disp: 60 tablet, Rfl: 0   traMADol  (ULTRAM ) 50 MG tablet, Take by mouth. (Patient not taking: Reported on 09/09/2023), Disp: , Rfl:   Social History: Social History   Tobacco Use   Smoking status: Former    Current packs/day: 0.00    Average packs/day: 0.5 packs/day for 16.0 years (8.0 ttl pk-yrs)    Types: Cigarettes    Start date: 06/25/1998    Quit date: 06/25/2014    Years since quitting: 9.2   Smokeless tobacco: Never  Vaping Use   Vaping status: Never Used  Substance Use  Topics   Alcohol use: Not Currently    Alcohol/week: 0.0 standard drinks of alcohol   Drug use: No    Family Medical History: Family History  Problem Relation Age of Onset   Lung cancer Father    Other Mother        breast cyst   Breast cancer Mother 49       tested   Alcohol abuse Maternal Uncle    Alcohol abuse Paternal Uncle    Ovarian cancer Maternal Grandmother 59   Lymphoma Maternal Grandfather        cancer   Breast cancer Maternal Aunt 60       Great Aunt, late 35s   Alcohol abuse Paternal Grandfather     Physical Examination: Vitals:   09/09/23 1439  BP: 103/63    General: Patient is in no apparent distress. Attention to examination is appropriate.  Neck:   Supple.  Full range of motion.  Respiratory: Patient is breathing without any difficulty.   NEUROLOGICAL:     Awake, alert, oriented to person, place, and time.  Speech is clear and fluent.   Cranial Nerves: Pupils equal round and reactive to light.  Facial tone is symmetric.  Facial sensation is symmetric. Shoulder shrug is symmetric. Tongue protrusion is midline.    Strength:  Side Iliopsoas Quads Hamstring PF DF EHL  R 5 5 5 5 5 5   L 5 5 5 5 5 5    Reflexes are 2+ and symmetric at the biceps, triceps, brachioradialis, patella and achilles.   Hoffman's is absent. Clonus is absent  Bilateral upper and lower extremity sensation is intact to light touch  Gait is normal.    Imaging: Narrative & Impression  CLINICAL DATA:  Low back pain, cauda equina syndrome suspected   EXAM: MRI LUMBAR SPINE WITHOUT CONTRAST   TECHNIQUE: Multiplanar, multisequence MR imaging of the lumbar spine was performed. No intravenous contrast was administered.   COMPARISON:  Lumbar spine MRI 06/16/2018   FINDINGS: Segmentation:  Standard.   Alignment:  Grade 1 anterolisthesis of L4 on L5.   Vertebrae:  No fracture, evidence of discitis, or bone lesion.   Conus medullaris and cauda equina: Conus extends to the  L1-L2 disc space level. Conus and cauda equina appear normal.   Paraspinal and other soft tissues: Urinary bladder is distended. There is asymmetric soft tissue edema along the right lateral aspect of the L4-L5 facet joint (series 6, image 4). There is a 5 x 4 mm fluid collection adjacent to the right-sided facet joint, which could represent an extra-spinal synovial cyst.   Disc levels:   T12-L1: Mild bilateral facet degenerative change. No spinal canal or neural foraminal stenosis.   L1-L2: Mild bilateral facet degenerative change. Minimal disc bulge. No spinal canal or neural foraminal stenosis.   L2-L3: Mild bilateral facet degenerative change. No significant disc bulge. No spinal canal or neural foraminal stenosis.   L3-L4: Moderate bilateral facet degenerative change. No significant disc bulge. No spinal canal stenosis. Mild bilateral neural foraminal stenosis.   L4-L5: Grade 1 anterolisthesis, new from prior exam. Severe bilateral facet degenerative change. Severe narrowing of the bilateral lateral recesses. Moderate to severe bilateral neural foraminal narrowing.   L5-S1: Circumferential disc bulge. Mild bilateral facet degenerative change. Mild spinal canal narrowing. Mild bilateral neural foraminal narrowing.   IMPRESSION: 1. Grade 1 anterolisthesis of L4 on L5, new from prior exam, with severe narrowing of the bilateral lateral recesses and moderate to severe bilateral neural foraminal narrowing at this level. 2. Asymmetric soft tissue edema along the right lateral aspect of the L4-L5 facet joint, which is nonspecific but may be degenerative in etiology. Correlation with ESR and CRP is recommended to exclude the possibility infection. If the inflammatory markers are elevated, further evaluation with contrast-enhanced lumbar spine MRI could be considered to assess for the possibility of septic arthritis. There is a 5 x 4 mm fluid collection adjacent to the  right-sided facet joint, which likely represents an extra-spinal synovial cyst. 3. Distended urinary bladder.     Electronically Signed   By: Clora Dane M.D.   On: 03/29/2023 19:42    I have personally reviewed the images and agree with the above interpretation.  Medical Decision Making/Assessment and Plan: Ms. Sala is a pleasant 53 y.o. female with with a spondylolisthesis at L4-5.  And a more mild spondylolisthesis at L5-S1.  She continues to have intermittent bouts of back pain, after her last visit she had an improvement with a home exercise regimen as well as conservative management and injections.  Unfortunately she has continued  to have a worsening exacerbation of her back pain radiating into her bilateral buttocks and legs.  She states that this is severe at this time and worsening with ambulation and while being upright.  She denies any weakness or persistent numbness.  No signs of cauda equina syndrome.  She is recently seen her pain medicine physician who did bilateral facet injections, we will plan for epidural spinal injections as well.  Was also given steroid taper and gabapentin .  She is not noticing any negative side effects of the gabapentin  at 200 mg nightly.  We encouraged her to establish care with physical therapy as she we will need to have a full evaluation prior to any consideration for surgical fixation of her spondylolisthesis.  Will plan on seeing her back after her physical therapy regimen to see whether or not she has any significant improvement.  Will also follow after her injections.  She does have a significant spondylolisthesis at L4-5 which is likely the cause of her positional/mechanical back pain.  She also has some radicular pain down her buttocks and back of her legs which is likely secondary to the foraminal stenosis caused from the spondylolisthesis.  We consider an interbody fusion procedure at L4-5 and possibly L5-S1 with posterior instrumentation should  she continue to fail conservative therapy.  Her anatomy would be receptive to a lateral interbody approach at L4-5.  Thank you for involving me in the care of this patient.    Carroll Clamp MD/MSCR Neurosurgery

## 2023-09-09 NOTE — Patient Instructions (Signed)
 LOCAL PHYSICAL THERAPY  [facility]  [address]  [place], [place] [address]  [phone]  [facility]  [address] [place], [place] [address]  [phone]  [facility] (2 locations)  [address]  [place], [place] [address]  [phone]          or  [address]  [place], [place] [address]  [phone]  [facility]  [address]  [place], [place] [address]  [phone]  **dry needling**  [facility] [facility])  [address]  [place], [place] [address]  [phone]  Fax: [phone]  ** Aquatic therapy[address] [place], [place] [address] [phone] **Aquatic therapy**  [facility]  [facility]  [address]  [place], [place] [address]  [phone]  [facility]  [address]  [place], [place] [address]  [phone]  [facility]  [address]   Janora Norlander  [place], [place] [address]  [phone]  [facility]  [address]  [place], [place] [address]  [phone]  **dry needling**   PELVIC FLOOR/SI JOINT  [facility]  Mariane Masters, PT  [email or web address]   [place]  [facility]  [address]  [address] [place], [place] [address]  [phone]   [facility] [place]  [address], [place] [address]  [phone]   [facility], [place]  [facility]  Raymond Gurney, PT  [address]  [place], [place] [address] [phone]   Samara Deist  [facility]  [address]  [phone]   [facility]  [address]  [phone]   [facility]  Annamaria Boots [facility]  [address]  [place], [place] [address]  [phone]   **No Aetna or medicaid**  [facility]  [address] [place] [address]  [phone]  [facility]  [address]  [place], [place] [address]  [phone]  Dannial Monarch, [place]  [address] [place], [place] [address]  [phone]  [facility]  [address] [place] [address]  [phone]  [facility]  [address]  [place], [place] [address]  [phone]  [facility]  (3 locations)  [address]  [place], [place] [address]  [phone]  **dry needling**           or  [address] [place], [place] [address]  [phone]  **Additionally has Gloris Manchester, OT**           or  [address]  [place], [place] [address]  [phone]  **Pediatric therapy**  [facility]  [address]  [phone]  **dry needlingVerdie Drown [facility]  [address]  [place], [place] [address]  [phone]  [facility]   (Inside [address] [facility])  [address]  [place], [place] [address]  [phone]  **dry needling**  **MEDICAID or UNINSURED** [facility] dept. Of Physical Therapy [place], [place] [address] [phone]  [facility]  [address] [place], [place] [address]  [phone]   [facility]  [address]  [place], [place] [address]  [phone]   Doreatha Martin  [facility]  [address] [place], [place] [address]  [phone]   [facility]  [address]  [place], [place] [address]  [phone]   [facility]  [address] [place], [place] [address]  [phone]  [facility]  [address]  [place], [place] [address]  [phone]  AQUATIC  Kathalene Frames [facility]  [employer]  [facility]  [place]  [place]  *Residents only*   [facility] [employer]  *[facility]  Exercise class  [facility]  Exercise class  [facility]  [address]  [place], [place]   [phone]  [facility]  [address]  [place], [place] [address]  [phone]   [place], [place]  [facility]  [address]  [phone]   [facility]  [facility]  [address]  [phone]           or  [address]  [phone]   [facility] Arthritis Support Group   Provides education and support and practical information for coping with arthritis for arthritis sufferers and their families.   When: 12:15 - 1:30 p.m. the second Monday of each month, March through December  Info: Call Rehabilitation Services at [phone]

## 2023-10-09 ENCOUNTER — Other Ambulatory Visit: Payer: Self-pay | Admitting: Family Medicine

## 2023-10-09 DIAGNOSIS — M5416 Radiculopathy, lumbar region: Secondary | ICD-10-CM

## 2023-10-10 ENCOUNTER — Ambulatory Visit
Admission: RE | Admit: 2023-10-10 | Discharge: 2023-10-10 | Disposition: A | Discharge status: Home / Self Care | Payer: 59 | Source: Ambulatory Visit | Attending: Family Medicine | Admitting: Family Medicine

## 2023-10-10 DIAGNOSIS — M5416 Radiculopathy, lumbar region: Secondary | ICD-10-CM | POA: Diagnosis present

## 2023-10-11 ENCOUNTER — Other Ambulatory Visit: Payer: Self-pay

## 2023-10-11 DIAGNOSIS — S2002XA Contusion of left breast, initial encounter: Secondary | ICD-10-CM | POA: Diagnosis not present

## 2023-10-11 DIAGNOSIS — W540XXA Bitten by dog, initial encounter: Secondary | ICD-10-CM | POA: Insufficient documentation

## 2023-10-11 DIAGNOSIS — S5782XA Crushing injury of left forearm, initial encounter: Secondary | ICD-10-CM | POA: Insufficient documentation

## 2023-10-11 DIAGNOSIS — S59912A Unspecified injury of left forearm, initial encounter: Secondary | ICD-10-CM | POA: Diagnosis present

## 2023-10-11 DIAGNOSIS — Z23 Encounter for immunization: Secondary | ICD-10-CM | POA: Diagnosis not present

## 2023-10-11 MED ORDER — OXYCODONE-ACETAMINOPHEN 5-325 MG PO TABS
1.0000 | ORAL_TABLET | ORAL | Status: DC | PRN
  Administered 2023-10-11: 1 via ORAL
  Filled 2023-10-11: qty 1

## 2023-10-11 NOTE — ED Triage Notes (Signed)
Bit by own dog. Puncture wounds to L FA and L breast. Bleeding controlled at this time.

## 2023-10-12 ENCOUNTER — Emergency Department
Admission: EM | Admit: 2023-10-12 | Discharge: 2023-10-12 | Disposition: A | Discharge status: Home / Self Care | Payer: 59 | Attending: Emergency Medicine | Admitting: Emergency Medicine

## 2023-10-12 ENCOUNTER — Emergency Department: Payer: 59

## 2023-10-12 DIAGNOSIS — S2002XA Contusion of left breast, initial encounter: Secondary | ICD-10-CM

## 2023-10-12 DIAGNOSIS — S5782XA Crushing injury of left forearm, initial encounter: Secondary | ICD-10-CM

## 2023-10-12 DIAGNOSIS — W540XXA Bitten by dog, initial encounter: Secondary | ICD-10-CM

## 2023-10-12 MED ORDER — AMOXICILLIN-POT CLAVULANATE 875-125 MG PO TABS
1.0000 | ORAL_TABLET | Freq: Once | ORAL | Status: AC
  Administered 2023-10-12: 1 via ORAL
  Filled 2023-10-12: qty 1

## 2023-10-12 MED ORDER — AMOXICILLIN-POT CLAVULANATE 875-125 MG PO TABS: 1.0000 | ORAL_TABLET | Freq: Two times a day (BID) | ORAL | 0 refills | Status: AC

## 2023-10-12 MED ORDER — TETANUS-DIPHTH-ACELL PERTUSSIS 5-2.5-18.5 LF-MCG/0.5 IM SUSY
0.5000 mL | PREFILLED_SYRINGE | Freq: Once | INTRAMUSCULAR | Status: AC
  Administered 2023-10-12: 0.5 mL via INTRAMUSCULAR
  Filled 2023-10-12: qty 0.5

## 2023-10-12 NOTE — Discharge Instructions (Addendum)
As we discussed, you should stay in communication with animal control while your dog is being observed.  If you change your mind about being treated for rabies, you can follow-up at the [facility] urgent care at the contact information listed.  Please take the full course of antibiotics prescribed tonight and keep your wounds clean.  It is okay to wash them normally with soap and water and they need to heal from the inside out.  You can cover them with a thin layer of antibiotic ointment such as bacitracin.  For any bruising or swelling you have, you can use cold packs on the areas to help with the swelling, and you can use ibuprofen and Tylenol as needed according to label instructions help with the pain.  Return to the emergency department if you develop new or worsening symptoms that concern you.

## 2023-10-12 NOTE — ED Provider Notes (Signed)
[facility] Provider Note    Event Date/Time   First MD Initiated Contact with Patient 10/12/23 [phone]     (approximate)   History   Animal Bite   HPI Ariana Weeks is a 53 y.o. female who presents for evaluation of dog bites to her left breast and left forearm.  She was bitten by her own dog who is a 160 pound great Pyrenees.  The dog has a history of aggression and has bitten the patient's husband in the past, and the attack tonight was apparently unprovoked.  The patient's husband is also a patient of mine.  They both report that the dog attacked her and had her on the ground and was dragging her across the garage floor when the husband intervened and was bitten on the left forearm in the process.  Animal control was called and the dog is locked up and under observation for a planned 10 days.  Unfortunately the patient and husband report that the dog is not up-to-date on rabies vaccination.  However the dog is confined to their yard and sleeps inside.  He has not been exhibiting any other abnormal behavior although he has attacked them in the past but they were hopeful they would be able to train him and control that behavior.     Physical Exam   Triage Vital Signs: ED Triage Vitals  Encounter Vitals Group     BP 10/11/23 2116 134/88     Systolic BP Percentile --      Diastolic BP Percentile --      Pulse Rate 10/11/23 2116 (!) 110     Resp 10/11/23 2116 19     Temp 10/11/23 2116 98.3 F (36.8 C)     Temp Source 10/11/23 2116 Oral     SpO2 10/11/23 2116 97 %     Weight 10/11/23 2117 68.9 kg (152 lb)     Height 10/11/23 2117 1.575 m (5\' 2" )     Head Circumference --      Peak Flow --      Pain Score 10/11/23 2117 10     Pain Loc --      Pain Education --      Exclude from Growth Chart --     Most recent vital signs: Vitals:   10/12/23 0221 10/12/23 0535  BP: 123/84 123/87  Pulse: 88 71  Resp: 18 16  Temp: 98.1 F (36.7 C) 97.6 F (36.4 C)   SpO2: 97% 100%    General: Awake, generally well despite the injuries. CV:  Good peripheral perfusion.  Resp:  Normal effort. Speaking easily and comfortably, no accessory muscle usage nor intercostal retractions.   Abd:  No distention.  Other:  Patient has significant swelling and ecchymosis of the left forearm with multiple superficial puncture wounds consistent with dog bites.  No active bleeding and no palpable deformities suggestive of foreign body such as tooth fragments.  Patient is able to flex and extend her wrist without any difficulty and she has no injury above the mid forearm with normal range of motion of her left elbow.  With a nurse present as chaperone, examined the patient's left breast, which is extensively bruised from crush injury from the dog bite, but there is again only 2 superficial, nonbleeding, and generally well-approximated wounds on the left anterior breast they do not require sutures.  No palpable foreign material are present in those wounds either   ED Results / Procedures / Treatments  Labs (all labs ordered are listed, but only abnormal results are displayed) Labs Reviewed - No data to display    RADIOLOGY I viewed and interpreted the patient's forearm x-rays and I see no evidence of fracture nor of canine dental fragments.   PROCEDURES:  Critical Care performed: No  Procedures    IMPRESSION / MDM / ASSESSMENT AND PLAN / ED COURSE  I reviewed the triage vital signs and the nursing notes.                              Differential diagnosis includes, but is not limited to, crush wound, laceration, neurovascular damage, compartment syndrome, hematoma, embedded foreign body.  Patient's presentation is most consistent with acute presentation with potential threat to life or bodily function.  Labs/studies ordered: X-rays of left forearm  Interventions/Medications given:  Medications  oxyCODONE-acetaminophen (PERCOCET/ROXICET) 5-325 MG per  tablet 1 tablet (1 tablet Oral Given 10/11/23 2130)  Tdap (BOOSTRIX) injection 0.5 mL (0.5 mLs Intramuscular Given 10/12/23 0556)  amoxicillin-clavulanate (AUGMENTIN) 875-125 MG per tablet 1 tablet (1 tablet Oral Given 10/12/23 0555)    (Note:  hospital course my include additional interventions and/or labs/studies not listed above.)   Fortunately, reassuring physical exam and imaging.  No evidence of compartment syndrome.  No need for splint.  Irrigated/cleansed wounds copiously and extensively in the ED.  Provided Augmentin in ED and Rx.  Recommended healing by secondary intention.  Had my usual and customary dog bite management and follow-up recommendations.  Of note, I had an extensive conversation with both dog bite victims.  We discussed rabies treatment given that the dog was not up-to-date.  However, after discussing the risks and benefits, they have decided to wait on treatment since the dog is under observation and animal control is involved.  They understand they can follow-up at [facility] urgent care if they decide to pursue treatment or if recommended by animal control.  Medications as listed above including updated Tdap.  I gave my usual return precautions.       FINAL CLINICAL IMPRESSION(S) / ED DIAGNOSES   Final diagnoses:  Dog bite, initial encounter  Crushing injury of left forearm, initial encounter  Contusion of left breast, initial encounter     Rx / DC Orders   ED Discharge Orders          Ordered    amoxicillin-clavulanate (AUGMENTIN) 875-125 MG tablet  2 times daily        10/12/23 0622             Note:  This document was prepared using Dragon voice recognition software and may include unintentional dictation errors.   Loleta Ellingsen, MD 10/12/23 [phone]

## 2023-10-13 ENCOUNTER — Telehealth: Payer: Self-pay

## 2023-10-13 DIAGNOSIS — N83201 Unspecified ovarian cyst, right side: Secondary | ICD-10-CM | POA: Insufficient documentation

## 2023-10-13 NOTE — Telephone Encounter (Signed)
Pt had a MRI done on 2/15, her PCP called to let us know the mri was recommending  a pelvic ultrasound. They just didn't want this to go without being seen since they do not do pelvic ultrasounds.  Pcp staff aware I would send you the message.   Impression number 4 4. Minimally covered 2.1 cm right adnexal cyst with some internal architecture possible on T2 weighted imaging. Recommend follow-up for pelvic ultrasound follow-up.

## 2023-10-13 NOTE — Telephone Encounter (Signed)
I placed the u/s order. Pls tell pt it is very common and not worrisome but u/s are best way to image the ovaries, so I'll follow up with her with the results. Usually cysts go away on their own. She can call Centralized scheduling at [facility] [phone], option #3 To schedule.  Thx

## 2023-10-13 NOTE — Telephone Encounter (Signed)
 Pt aware.

## 2023-10-21 ENCOUNTER — Ambulatory Visit (INDEPENDENT_AMBULATORY_CARE_PROVIDER_SITE_OTHER): Payer: 59 | Admitting: Physician Assistant

## 2023-10-21 ENCOUNTER — Telehealth: Payer: Self-pay | Admitting: Neurosurgery

## 2023-10-21 ENCOUNTER — Ambulatory Visit: Admission: RE | Admit: 2023-10-21 | Payer: 59 | Source: Ambulatory Visit

## 2023-10-21 DIAGNOSIS — M4316 Spondylolisthesis, lumbar region: Secondary | ICD-10-CM | POA: Diagnosis not present

## 2023-10-21 DIAGNOSIS — M48061 Spinal stenosis, lumbar region without neurogenic claudication: Secondary | ICD-10-CM | POA: Diagnosis not present

## 2023-10-21 MED ORDER — CYCLOBENZAPRINE HCL 5 MG PO TABS
5.0000 mg | ORAL_TABLET | Freq: Three times a day (TID) | ORAL | Status: AC | PRN
Start: 2023-10-21 — End: ?

## 2023-10-21 NOTE — Telephone Encounter (Signed)
Error

## 2023-10-21 NOTE — Progress Notes (Signed)
 MRI Lumbar Spine (10/10/23): IMPRESSION: 1. 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 Marrow edema is new from 04/09/2023. 2. L4-5 moderate spinal stenosis with left more than right foraminal impingement. 3. L5-S1 chronic central disc extrusion without neural compression. 4. Minimally covered 2.1 cm right adnexal cyst with some internal architecture possible on T2 weighted imaging. Recommend follow-up for pelvic ultrasound follow-up.   Patient called due to severe pain in her lower back.  She states this is unchanged from its baseline, but she feels as though her injections and prednisone course has worn off and therefore she is in severe pain again.  She said her pain is so bad that she was unable to go to work today.  She describes it as a sharp jabbing pain in her low back.  She denies any new weakness, numbness or tingling.  She did have relief from her previous injection 3 weeks ago.  She was also prescribed hydrocodone, but has not been taking it due to it making her drowsy.  Due to her previous gastric surgery she is unable to use NSAIDs.  She feels as though her back is tightening up and spasming quite a bit.  She is scheduled for a injection in her back next week.  I discussed with the patient that if her pain is intractable and so severe that she can go to the emergency room.  At this point she has been prescribed narcotics, she is unable to take NSAIDs, she has had multiple doses of steroids.  We did discuss using her gabapentin more frequently as well as a muscle relaxer.  Flexeril was prescribed and sent.  All red flag symptoms were reviewed with the patient at length.  We are happy to see her in person or if she feels as though she needs to go to the ER, both options were discussed.  If she continues to have worsening pain, weakness, or numbness or tingling she is asked to notify us as soon as possible.   I connected with  Ariana Weeks on 10/21/23 by  telephone and verified that I am speaking with the correct person using two identifiers. She was in her private residence and I was in the office.   I discussed the limitations of evaluation and management by telemedicine. The patient expressed understanding and agreed to proceed.

## 2023-11-09 ENCOUNTER — Encounter: Payer: Self-pay | Admitting: Obstetrics and Gynecology

## 2023-12-14 ENCOUNTER — Ambulatory Visit
Admission: RE | Admit: 2023-12-14 | Discharge: 2023-12-14 | Disposition: A | Discharge status: Home / Self Care | Source: Ambulatory Visit | Attending: Obstetrics and Gynecology | Admitting: Obstetrics and Gynecology

## 2023-12-14 DIAGNOSIS — N83201 Unspecified ovarian cyst, right side: Secondary | ICD-10-CM | POA: Insufficient documentation

## 2023-12-23 ENCOUNTER — Telehealth: Payer: Self-pay

## 2023-12-23 ENCOUNTER — Encounter: Payer: Self-pay | Admitting: Obstetrics and Gynecology

## 2023-12-23 NOTE — Telephone Encounter (Signed)
 Received call from patient requesting ultrasound results from  12/14/23.  Advised results have not been released yet.  Called radiology room and they stated they would put them in queue to be released.  Called and let patient know we are working on getting those results.  She requests a call back from [place] (not a MyChart message) to discuss when they have been reviewed.    Imogene Mana RN

## 2024-01-01 ENCOUNTER — Other Ambulatory Visit: Payer: Self-pay | Admitting: Internal Medicine

## 2024-01-14 ENCOUNTER — Other Ambulatory Visit: Payer: Self-pay | Admitting: Obstetrics and Gynecology

## 2024-01-14 DIAGNOSIS — Z30011 Encounter for initial prescription of contraceptive pills: Secondary | ICD-10-CM

## 2024-01-19 NOTE — Progress Notes (Signed)
 PCP:  Thersia Flax, MD   Chief Complaint  Patient presents with   Gynecologic Exam    No concerns    HPI:      [name] is a 53 y.o. [id number] who LMP was Patient's last menstrual period was 01/16/2024 (approximate)., presents today for her annual examination.  Her menses are monthly with POPs, started after IUD removal 9/24 because didn't want to get pregnant, light bleeding for 3-4 days, no BTB, no dysmen.  Has occas vasomotor sx. Hx of RT adnexal cyst on MRI for her back 2/25; Gyn u/s 4/25 was normal and no evidence of RTO pathology.   Sex activity: single partner, contraception - POPs. Has some vaginal dryness and pain, uses lubricants with some relief. Also with decreased libido and would like tx.  Last Pap: 09/26/20  Results were: no abnormalities /neg HPV DNA  Hx of STDs: none  Last mammogram: 04/17/23 Results were normal; Hx of atypical lobular hyperplasia and lobular CIS with Dr. Marquita Situ 2018. Pt did evista  for 5 yrs (couldn't take tamoxifen with her mood meds); saw Dr. Marquita Situ Q6 months, now seeing Dr. Irving Mantle. No upcoming appt.  There is a FH of breast cancer in her mom (lobular CIS) and mat grt aunt. There is a FH of ovarian cancer in her MGM, Pt is MyRisk neg except AXIN2 VUS 2020; IBIS=66% due to hx of lobular CIS. The patient does not do self-breast exams.   Tobacco use: The patient denies current or previous tobacco use. Alcohol use: none No drug use.  Exercise: min active  Colonoscopy: never; Neg Cologuard 8/23, repeat due after 3 yrs  She does not get adequate calcium but does get Vitamin D  in her diet.  Labs with PCP.     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 Marquita Situ   Sleep apnea    no- c-pap   Supervision of high risk pregnancy, antepartum 05/18/2014   Formatting of this note might be different from the original. Case discussed at Peds/Perinatal multidisciplinary conf 05/18/2014. Aortic atresia/mitral stenosis Delivery plan: repeat c-section at 39 weeks    Past Surgical History:  Procedure Laterality Date   BREAST BIOPSY Right 02/02/2013   us  bx/clip-neg   BREAST BIOPSY Right 02/19/2017   affirm, ATYPICAL LOBULAR HYPERPLASIA   BREAST BIOPSY Right 03/04/2017   Procedure: BREAST BIOPSY WITH NEEDLE LOCALIZATION;  Surgeon: Marshall Skeeter, MD;  Location: [facility] ORS;  Service: General;  Laterality: Right;   BREAST LUMPECTOMY Right 2018   CESAREAN SECTION  2005 2015   CHOLECYSTECTOMY  09/2012   CYSTOSCOPY W/ RETROGRADES N/A 02/02/2017   Procedure: CYSTOSCOPY WITH RETROGRADE PYELOGRAM;  Surgeon: Homero Luster, MD;  Location: [facility] ORS;  Service: Urology;  Laterality: N/A;   CYSTOSCOPY/URETEROSCOPY/HOLMIUM LASER/STENT PLACEMENT Left 07/10/2020   Procedure: CYSTOSCOPY/URETEROSCOPY/HOLMIUM LASER/STENT PLACEMENT;  Surgeon: Geraline Knapp, MD;  Location: [facility] ORS;  Service: Urology;  Laterality: Left;   EXTRACORPOREAL SHOCK WAVE LITHOTRIPSY  2009   INTRAUTERINE DEVICE INSERTION  MARCH 2012   MURINA   LAPAROSCOPIC GASTRIC SLEEVE RESECTION N/A 08/09/2019   Procedure: LAPAROSCOPIC GASTRIC SLEEVE RESECTION, Upper Endo, ERAS [place];  Surgeon: Jacolyn Matar, MD;  Location: [facility];  Service: General;  Laterality: N/A;   URETERAL STENT PLACEMENT  1998  (APPROX)   Kidney Stone   URETEROSCOPY WITH HOLMIUM LASER LITHOTRIPSY Left 02/02/2017   Procedure: URETEROSCOPY WITH HOLMIUM LASER LITHOTRIPSY;  Surgeon: Homero Luster, MD;  Location: [facility] ORS;  Service: Urology;  Laterality: Left;    Family History  Problem Relation Age of Onset   Other Mother        breast  cyst   Breast cancer Mother 64       tested   Lung cancer Father    Skin cancer Father    Lymphoma Father    Breast cancer Maternal Aunt 9       Great Aunt, late 97s   Alcohol abuse Maternal Uncle    Alcohol abuse Paternal Uncle    Ovarian cancer Maternal Grandmother 79   Lymphoma Maternal Grandfather        cancer   Alcohol abuse Paternal Grandfather     Social History   Socioeconomic History   Marital status: Married    Spouse name: Not on file   Number of children: 1   Years of education: Not on file   Highest education level: Not on file  Occupational History   Occupation: Special ED Teacher    Comment: [employer]  Tobacco Use   Smoking status: Former    Current packs/day: 0.00    Average packs/day: 0.5 packs/day for 16.0 years (8.0 ttl pk-yrs)    Types: Cigarettes    Start date: 06/25/1998    Quit date: 06/25/2014    Years since quitting: 9.5   Smokeless tobacco: Never  Vaping Use   Vaping status: Never Used  Substance and Sexual Activity   Alcohol use: Yes    Comment: very rare   Drug use: No   Sexual activity: Yes    Birth control/protection: Pill  Other Topics Concern   Not on file  Social History Narrative   No regular exercise.      Nutrition: 3 meals a day, limited fruits and vegetables, fast meals occasionally      Cell: [phone]      Married   Technical brewer education   2 children    Caffeine- 1-2 cups of coke daily   Social Drivers of Corporate investment banker Strain: Not on file  Food Insecurity: Not on file  Transportation Needs: Not on file  Physical Activity: Not on file  Stress: Not on file  Social Connections: Not on file  Intimate Partner Violence: Not on file    Current Meds  Medication Sig   AMBULATORY NON FORMULARY MEDICATION Medication Name: Testosterone 1 mg/ml topical cream Apply 1 ml pump to labia daily, avoid transference   buPROPion  (WELLBUTRIN  XL) 300 MG 24 hr tablet TAKE 1 TABLET(300  MG) BY MOUTH DAILY   gabapentin  (NEURONTIN ) 100 MG capsule 2 po qHS   hydrochlorothiazide  (HYDRODIURIL ) 25 MG tablet TAKE 1 TABLET(25 MG) BY MOUTH DAILY   HYDROcodone -acetaminophen  (NORCO/VICODIN) 5-325 MG tablet Take 1 tablet by mouth 2 (two) times daily as needed.   PARoxetine  (PAXIL ) 10 MG tablet TAKE 1 TABLET(10 MG) BY MOUTH DAILY   [DISCONTINUED] norethindrone  (MICRONOR ) 0.35 MG tablet Take 1 tablet (0.35 mg total) by mouth  daily.   Current Facility-Administered Medications for the 01/25/24 encounter (Office Visit) with Ranell Skibinski B, PA-C  Medication   cyclobenzaprine  (FLEXERIL ) tablet 5 mg     ROS:  Review of Systems  Constitutional:  Negative for fatigue, fever and unexpected weight change.  Respiratory:  Negative for cough, shortness of breath and wheezing.   Cardiovascular:  Negative for chest pain, palpitations and leg swelling.  Gastrointestinal:  Negative for blood in stool, constipation, diarrhea, nausea and vomiting.  Endocrine: Negative for cold intolerance, heat intolerance and polyuria.  Genitourinary:  Positive for dyspareunia. Negative for dysuria, flank pain, frequency, genital sores, hematuria, menstrual problem, pelvic pain, urgency, vaginal bleeding, vaginal discharge and vaginal pain.  Musculoskeletal:  Negative for back pain, joint swelling and myalgias.  Skin:  Negative for rash.  Neurological:  Negative for dizziness, syncope, light-headedness, numbness and headaches.  Hematological:  Negative for adenopathy.  Psychiatric/Behavioral:  Negative for agitation, confusion, dysphoric mood, sleep disturbance and suicidal ideas. The patient is not nervous/anxious.      Objective: BP 99/65   Pulse 85   Ht 5\' 2"  (1.575 m)   Wt 159 lb (72.1 kg)   LMP 01/16/2024 (Approximate)   BMI 29.08 kg/m    Physical Exam Constitutional:      Appearance: She is well-developed.  Genitourinary:     Vulva normal.     Right Labia: No rash, tenderness or lesions.     Left Labia: No tenderness, lesions or rash.    No vaginal discharge, erythema or tenderness.     No vaginal atrophy present.     Right Adnexa: not tender and no mass present.    Left Adnexa: not tender and no mass present.    No cervical motion tenderness, friability or polyp.     Uterus is not enlarged or tender.  Breasts:    Right: No mass, nipple discharge, skin change or tenderness.     Left: No mass, nipple discharge, skin change or tenderness.  Neck:     Thyroid : No thyromegaly.  Cardiovascular:     Rate and Rhythm: Normal rate and regular rhythm.     Heart sounds: Normal heart sounds. No murmur heard. Pulmonary:     Effort: Pulmonary effort is normal.     Breath sounds: Normal breath sounds.  Abdominal:     Palpations: Abdomen is soft.     Tenderness: There is no abdominal tenderness. There is no guarding or rebound.  Musculoskeletal:        General: Normal range of motion.     Cervical back: Normal range of motion.  Lymphadenopathy:     Cervical: No cervical adenopathy.  Neurological:     General: No focal deficit present.     Mental Status: She is alert and oriented to person, place, and time.     Cranial Nerves: No cranial nerve deficit.  Skin:    General: Skin is warm and dry.  Psychiatric:        Mood and Affect: Mood normal.        Behavior: Behavior normal.        Thought Content: Thought content normal.        Judgment: Judgment normal.  Vitals reviewed.     Assessment/Plan:  Encounter for annual routine gynecological examination  Cervical cancer screening - Plan: Cytology - PAP  Screening for HPV (human papillomavirus) - Plan: Cytology - PAP  Encounter for screening mammogram for malignant neoplasm of breast - Plan: MM 3D SCREENING MAMMOGRAM BILATERAL  BREAST; pt to schedule mammo  Lobular carcinoma in situ (LCIS) of right breast - Plan: MM 3D SCREENING MAMMOGRAM BILATERAL BREAST; recommended pt call Dr. Irving Mantle office to schedule f/u if needed. D/c  POPs since no longer risk of conception/would rather pt not be on hormones.   Family history of breast cancer - Plan: MM 3D SCREENING MAMMOGRAM BILATERAL BREAST; pt is MyRisk neg  Increased risk of breast cancer - Plan: MM 3D SCREENING MAMMOGRAM BILATERAL BREAST; followed by onc.   Decreased libido - Plan: AMBULATORY NON FORMULARY MEDICATION; discussed trying topical testosterone to labia, Rx faxed to [facility], f/u in 2-3 months.   Meds ordered this encounter  Medications   AMBULATORY NON FORMULARY MEDICATION    Sig: Medication Name: Testosterone 1 mg/ml topical cream Apply 1 ml pump to labia daily, avoid transference    Dispense:  30 mL    Refill:  2    Supervising Provider:   ROBY, MICIA [[id number]]    GYN counsel breast self exam, mammography screening, adequate intake of calcium and vitamin D , diet and exercise     F/U  Return in about 1 year (around 01/24/2025) for annual.  Yamen Castrogiovanni B. Icyss Skog, PA-C 01/26/2024 11:32 AM

## 2024-01-25 ENCOUNTER — Ambulatory Visit (INDEPENDENT_AMBULATORY_CARE_PROVIDER_SITE_OTHER): Admitting: Obstetrics and Gynecology

## 2024-01-25 ENCOUNTER — Other Ambulatory Visit (HOSPITAL_COMMUNITY)
Admission: RE | Admit: 2024-01-25 | Discharge: 2024-01-25 | Disposition: A | Discharge status: Home / Self Care | Source: Ambulatory Visit | Attending: Obstetrics and Gynecology | Admitting: Obstetrics and Gynecology

## 2024-01-25 ENCOUNTER — Encounter: Payer: Self-pay | Admitting: Obstetrics and Gynecology

## 2024-01-25 VITALS — BP 99/65 | HR 85 | Ht 62.0 in | Wt 159.0 lb

## 2024-01-25 DIAGNOSIS — Z01419 Encounter for gynecological examination (general) (routine) without abnormal findings: Secondary | ICD-10-CM | POA: Diagnosis not present

## 2024-01-25 DIAGNOSIS — Z1151 Encounter for screening for human papillomavirus (HPV): Secondary | ICD-10-CM

## 2024-01-25 DIAGNOSIS — R6882 Decreased libido: Secondary | ICD-10-CM

## 2024-01-25 DIAGNOSIS — D0501 Lobular carcinoma in situ of right breast: Secondary | ICD-10-CM

## 2024-01-25 DIAGNOSIS — Z3041 Encounter for surveillance of contraceptive pills: Secondary | ICD-10-CM

## 2024-01-25 DIAGNOSIS — Z124 Encounter for screening for malignant neoplasm of cervix: Secondary | ICD-10-CM | POA: Insufficient documentation

## 2024-01-25 DIAGNOSIS — Z1231 Encounter for screening mammogram for malignant neoplasm of breast: Secondary | ICD-10-CM

## 2024-01-25 DIAGNOSIS — Z803 Family history of malignant neoplasm of breast: Secondary | ICD-10-CM

## 2024-01-25 MED ORDER — AMBULATORY NON FORMULARY MEDICATION: 2 refills | Status: AC

## 2024-01-25 NOTE — Patient Instructions (Signed)
 I value your feedback and you entrusting [place] with your care. If you get a [place] patient survey, I would appreciate you taking the time to let us know about your experience today. Thank you! ? ? ?

## 2024-01-26 ENCOUNTER — Encounter: Payer: Self-pay | Admitting: Obstetrics and Gynecology

## 2024-01-26 ENCOUNTER — Other Ambulatory Visit: Payer: Self-pay

## 2024-01-26 NOTE — Telephone Encounter (Signed)
 Autum from [facility] called about patient's prescription for testosterone cream. Some of the fax was cut off and unclear. No name, no directions. She needs fax to be resubmitted. Could you reorder this, I can't because I think it is a controlled substance.  CB

## 2024-01-26 NOTE — Telephone Encounter (Signed)
 Pls refax testosterone Rx.

## 2024-01-28 ENCOUNTER — Telehealth: Payer: Self-pay

## 2024-01-28 NOTE — Telephone Encounter (Signed)
 LM for pt. Stop POPs. Discussed paragard, phexxi, condoms, TL or vasectomy. Although conception not concern after age 51. Can still do testosterone crm.

## 2024-01-28 NOTE — Telephone Encounter (Signed)
 Pt calling triage stating she is returning your call from Tuesday. She wanted to know if there was any other option of BC to try instead of the IUD? And did you say to Not start the progesterone?

## 2024-01-29 LAB — CYTOLOGY - PAP
Comment: NEGATIVE
Diagnosis: NEGATIVE
High risk HPV: NEGATIVE

## 2024-02-15 NOTE — Telephone Encounter (Signed)
 Patient states she isn't doing well on the testosterone cream. She states it burns and she doesn't like it. She is inquiring if she could do Testosterone injections instead.

## 2024-02-16 NOTE — Telephone Encounter (Signed)
 LM for pt. She can use testosterone crm same dosage on inner arms/thighs. Not doing injections. Hx of vaginal dryness which is why she may have burning. Can't have vag ERT due to hx of lobular CIS.

## 2024-02-28 ENCOUNTER — Other Ambulatory Visit: Payer: Self-pay | Admitting: Internal Medicine

## 2024-03-01 ENCOUNTER — Ambulatory Visit (INDEPENDENT_AMBULATORY_CARE_PROVIDER_SITE_OTHER): Admitting: Internal Medicine

## 2024-03-01 ENCOUNTER — Encounter: Payer: Self-pay | Admitting: Internal Medicine

## 2024-03-01 VITALS — BP 100/68 | HR 99 | Ht 62.0 in | Wt 150.0 lb

## 2024-03-01 DIAGNOSIS — G2581 Restless legs syndrome: Secondary | ICD-10-CM

## 2024-03-01 DIAGNOSIS — I10 Essential (primary) hypertension: Secondary | ICD-10-CM

## 2024-03-01 DIAGNOSIS — E781 Pure hyperglyceridemia: Secondary | ICD-10-CM | POA: Diagnosis not present

## 2024-03-01 DIAGNOSIS — R7303 Prediabetes: Secondary | ICD-10-CM

## 2024-03-01 DIAGNOSIS — D72829 Elevated white blood cell count, unspecified: Secondary | ICD-10-CM

## 2024-03-01 DIAGNOSIS — N952 Postmenopausal atrophic vaginitis: Secondary | ICD-10-CM | POA: Insufficient documentation

## 2024-03-01 DIAGNOSIS — F32A Depression, unspecified: Secondary | ICD-10-CM

## 2024-03-01 DIAGNOSIS — R5383 Other fatigue: Secondary | ICD-10-CM | POA: Diagnosis not present

## 2024-03-01 DIAGNOSIS — F520 Hypoactive sexual desire disorder: Secondary | ICD-10-CM | POA: Insufficient documentation

## 2024-03-01 DIAGNOSIS — F419 Anxiety disorder, unspecified: Secondary | ICD-10-CM

## 2024-03-01 DIAGNOSIS — E66811 Obesity, class 1: Secondary | ICD-10-CM

## 2024-03-01 MED ORDER — PAROXETINE HCL 20 MG PO TABS: 20.0000 mg | ORAL_TABLET | Freq: Every day | ORAL | 1 refills | Status: DC

## 2024-03-01 MED ORDER — HYDROCHLOROTHIAZIDE 25 MG PO TABS: ORAL_TABLET | ORAL | 1 refills | Status: DC

## 2024-03-01 MED ORDER — BUPROPION HCL ER (XL) 300 MG PO TB24: ORAL_TABLET | ORAL | 1 refills | Status: DC

## 2024-03-01 NOTE — Assessment & Plan Note (Signed)
 Resolved,  secondary to prednisone  use  for lumbar radiculitis (started 2 days prior to CBC draw) ,  Lab Results  Component Value Date   WBC 8.2 04/01/2023   HGB 13.6 04/01/2023   HCT 41.3 04/01/2023   MCV 87.6 04/01/2023   PLT 259.0 04/01/2023

## 2024-03-01 NOTE — Assessment & Plan Note (Signed)
 Symptoms  are well controlled currently on wellbutrin  and paxil .  No changes today /

## 2024-03-01 NOTE — Assessment & Plan Note (Signed)
 She has lost 35 lbs with semaglutide obtained through [facility].  Encouraged to exercise

## 2024-03-01 NOTE — Patient Instructions (Addendum)
 [facility] MD in [place] for testosterone pellets   I will confer with [place] about vaginal estrogen   Increase the paxil  to 20 mg daily     You might want to try using Relaxium for insomnia  (as seen on TV commercials) . It contains:  Melatonin 5 mg  Chamomile 25 mg Passionflower extract 75 mg GABA 100 mg Ashwaganda extract 125 mg Magnesium citrate, glycinate, oxide (100 mg)  L tryptophan 500 mg Valerest (proprietary  ingredient ; probably valeria root extract)

## 2024-03-01 NOTE — Progress Notes (Signed)
 Subjective:  Patient ID: Ariana Weeks, female    DOB: 1971-05-25  Age: 53 y.o. MRN: [id number]  CC: The primary encounter diagnosis was Hypertension, unspecified type. Diagnoses of Prediabetes, Hypertriglyceridemia, Other fatigue, Restless legs, Atrophic vaginitis, Lack of libido, Anxiety and depression, Obesity (BMI 30.0-34.9), and Leukocytosis, unspecified type were also pertinent to this visit.   HPI Ariana Weeks presents for  Chief Complaint  Patient presents with   Medical Management of Chronic Issues    1) Back pin improved with RFA by Chasnis.  Occasional  pain on the right due to lifting  her 51 yr old autistic Down's syndrome son who is now obese at 120 lbs due to use of risperidone to control anger/aggressiveness   2) overweight :  has lost  39  lbs using semaglutide . From [facility].  Dose unsure .  Has reached her goal of 145 lbs but not exercising.  Limited to exercising due  to care of son   3) Depression: always worse in the summer ; feeling more irritable.  but looking forward a [place] trip with girlfriends next week . IUD removed due to pain /vaginal estrogen .   No interest in sex.  Obgyn rec'd testosterone cream      Outpatient Medications Prior to Visit  Medication Sig Dispense Refill   AMBULATORY NON FORMULARY MEDICATION Medication Name: Testosterone 1 mg/ml topical cream Apply 1 ml pump to labia daily, avoid transference 30 mL 2   gabapentin  (NEURONTIN ) 100 MG capsule 2 po qHS     buPROPion  (WELLBUTRIN  XL) 300 MG 24 hr tablet TAKE 1 TABLET(300 MG) BY MOUTH DAILY 90 tablet 1   hydrochlorothiazide  (HYDRODIURIL ) 25 MG tablet TAKE 1 TABLET(25 MG) BY MOUTH DAILY 90 tablet 1   HYDROcodone -acetaminophen  (NORCO/VICODIN) 5-325 MG tablet Take 1 tablet by mouth 2 (two) times daily as needed. 60 tablet 0   PARoxetine  (PAXIL ) 10 MG tablet TAKE 1 TABLET(10 MG) BY MOUTH DAILY 90 tablet 0   Facility-Administered Medications Prior to Visit  Medication Dose Route Frequency  Provider Last Rate Last Admin   cyclobenzaprine  (FLEXERIL ) tablet 5 mg  5 mg Oral TID PRN         Review of Systems;  Patient denies headache, fevers, malaise, unintentional weight loss, skin rash, eye pain, sinus congestion and sinus pain, sore throat, dysphagia,  hemoptysis , cough, dyspnea, wheezing, chest pain, palpitations, orthopnea, edema, abdominal pain, nausea, melena, diarrhea, constipation, flank pain, dysuria, hematuria, urinary  Frequency, nocturia, numbness, tingling, seizures,  Focal weakness, Loss of consciousness,  Tremor, insomnia, depression, anxiety, and suicidal ideation.      Objective:  BP 100/68   Pulse 99   Ht 5' 2 (1.575 m)   Wt 150 lb (68 kg)   SpO2 96%   BMI 27.44 kg/m   BP Readings from Last 3 Encounters:  03/01/24 100/68  01/25/24 99/65  10/12/23 125/89    Wt Readings from Last 3 Encounters:  03/01/24 150 lb (68 kg)  01/25/24 159 lb (72.1 kg)  10/11/23 152 lb (68.9 kg)    Physical Exam Vitals reviewed.  Constitutional:      General: She is not in acute distress.    Appearance: Normal appearance. She is normal weight. She is not ill-appearing, toxic-appearing or diaphoretic.  HENT:     Head: Normocephalic.  Eyes:     General: No scleral icterus.       Right eye: No discharge.        Left eye:  No discharge.     Conjunctiva/sclera: Conjunctivae normal.  Cardiovascular:     Rate and Rhythm: Normal rate and regular rhythm.     Heart sounds: Normal heart sounds.  Pulmonary:     Effort: Pulmonary effort is normal. No respiratory distress.     Breath sounds: Normal breath sounds.  Musculoskeletal:        General: Normal range of motion.  Skin:    General: Skin is warm and dry.  Neurological:     General: No focal deficit present.     Mental Status: She is alert and oriented to person, place, and time. Mental status is at baseline.  Psychiatric:        Mood and Affect: Mood normal.        Behavior: Behavior normal.        Thought  Content: Thought content normal.        Judgment: Judgment normal.     Lab Results  Component Value Date   HGBA1C 5.9 10/09/2022   HGBA1C 5.3 06/09/2018   HGBA1C 5.4 06/14/2015    Lab Results  Component Value Date   CREATININE 0.86 04/01/2023   CREATININE 0.88 10/09/2022   CREATININE 0.80 03/25/2022    Lab Results  Component Value Date   WBC 8.2 04/01/2023   HGB 13.6 04/01/2023   HCT 41.3 04/01/2023   PLT 259.0 04/01/2023   GLUCOSE 148 (H) 04/01/2023   CHOL 190 10/09/2022   TRIG (H) 10/09/2022    682.0 Triglyceride is over 400; calculations on Lipids are invalid.   HDL 38.20 (L) 10/09/2022   LDLDIRECT 110.0 10/09/2022   LDLCALC 128 (H) 08/30/2021   ALT 11 04/01/2023   AST 12 04/01/2023   NA 138 04/01/2023   K 3.5 04/01/2023   CL 101 04/01/2023   CREATININE 0.86 04/01/2023   BUN 15 04/01/2023   CO2 28 04/01/2023   TSH 1.26 10/09/2022   HGBA1C 5.9 10/09/2022    No results found.  Assessment & Plan:  .Hypertension, unspecified type -     Comprehensive metabolic panel with GFR -     Microalbumin / creatinine urine ratio  Prediabetes -     Comprehensive metabolic panel with GFR -     Hemoglobin A1c  Hypertriglyceridemia -     Lipid panel -     LDL cholesterol, direct  Other fatigue -     CBC with Differential/Platelet -     TSH  Restless legs -     Iron, TIBC and Ferritin Panel  Atrophic vaginitis Assessment & Plan: She is not a candidate for aginal estrogen per discussion with OBGYN due to history of LCIS and did not tolerate testosterone cream . Recommend use of coconut oil and hyaluronic acid suppositories to increase moisture   Lack of libido Assessment & Plan: She was advised that [facility] offered testosterone implantation    Anxiety and depression Assessment & Plan: Symptoms  are well controlled currently on wellbutrin  and paxil .  No changes today /   Obesity (BMI 30.0-34.9) Assessment & Plan: She has lost 35 lbs with semaglutide  obtained through [facility].  Encouraged to exercise    Leukocytosis, unspecified type Assessment & Plan: Resolved,  secondary to prednisone  use  for lumbar radiculitis (started 2 days prior to CBC draw) ,  Lab Results  Component Value Date   WBC 8.2 04/01/2023   HGB 13.6 04/01/2023   HCT 41.3 04/01/2023   MCV 87.6 04/01/2023   PLT 259.0 04/01/2023  Other orders -     buPROPion  HCl ER (XL); TAKE 1 TABLET(300 MG) BY MOUTH DAILY  Dispense: 90 tablet; Refill: 1 -     hydroCHLOROthiazide ; TAKE 1 TABLET(25 MG) BY MOUTH DAILY  Dispense: 90 tablet; Refill: 1 -     PARoxetine  HCl; Take 1 tablet (20 mg total) by mouth daily.  Dispense: 90 tablet; Refill: 1     I spent 34 minutes on the day of this face to face encounter reviewing patient's  most recent visit with OBGYN,  prior relevant surgical and non surgical procedures, recent  labs and imaging studies, counseling on weight management,  reviewing the assessment and plan with patient, and post visit ordering and reviewing of  diagnostics and therapeutics with patient  .   Follow-up: No follow-ups on file.   Verneita LITTIE Kettering, MD

## 2024-03-01 NOTE — Assessment & Plan Note (Signed)
 She was advised that [facility] offered testosterone implantation

## 2024-03-01 NOTE — Assessment & Plan Note (Addendum)
 She is not a candidate for aginal estrogen per discussion with OBGYN due to history of LCIS and did not tolerate testosterone cream . Recommend use of coconut oil and hyaluronic acid suppositories to increase moisture

## 2024-03-02 LAB — CBC WITH DIFFERENTIAL/PLATELET
Basophils Absolute: 0 K/uL (ref 0.0–0.1)
Basophils Relative: 1.1 % (ref 0.0–3.0)
Eosinophils Absolute: 0 K/uL (ref 0.0–0.7)
Eosinophils Relative: 0.9 % (ref 0.0–5.0)
HCT: 39.1 % (ref 36.0–46.0)
Hemoglobin: 12.8 g/dL (ref 12.0–15.0)
Lymphocytes Relative: 32 % (ref 12.0–46.0)
Lymphs Abs: 1.4 K/uL (ref 0.7–4.0)
MCHC: 32.8 g/dL (ref 30.0–36.0)
MCV: 80.1 fl (ref 78.0–100.0)
Monocytes Absolute: 0.3 K/uL (ref 0.1–1.0)
Monocytes Relative: 8 % (ref 3.0–12.0)
Neutro Abs: 2.5 K/uL (ref 1.4–7.7)
Neutrophils Relative %: 58 % (ref 43.0–77.0)
Platelets: 209 K/uL (ref 150.0–400.0)
RBC: 4.88 Mil/uL (ref 3.87–5.11)
RDW: 17 % — ABNORMAL HIGH (ref 11.5–15.5)
WBC: 4.3 K/uL (ref 4.0–10.5)

## 2024-03-02 LAB — COMPREHENSIVE METABOLIC PANEL WITH GFR
ALT: 10 U/L (ref 0–35)
AST: 14 U/L (ref 0–37)
Albumin: 4.1 g/dL (ref 3.5–5.2)
Alkaline Phosphatase: 57 U/L (ref 39–117)
BUN: 11 mg/dL (ref 6–23)
CO2: 29 meq/L (ref 19–32)
Calcium: 9.2 mg/dL (ref 8.4–10.5)
Chloride: 104 meq/L (ref 96–112)
Creatinine, Ser: 0.85 mg/dL (ref 0.40–1.20)
GFR: 79.64 mL/min (ref >60.00)
Glucose, Bld: 72 mg/dL (ref 70–99)
Potassium: 3.7 meq/L (ref 3.5–5.1)
Sodium: 140 meq/L (ref 135–145)
Total Bilirubin: 0.9 mg/dL (ref 0.2–1.2)
Total Protein: 6.5 g/dL (ref 6.0–8.3)

## 2024-03-02 LAB — LIPID PANEL
Cholesterol: 137 mg/dL (ref 0–200)
HDL: 49.7 mg/dL (ref >39.00)
LDL Cholesterol: 67 mg/dL (ref 0–99)
NonHDL: 87.41
Total CHOL/HDL Ratio: 3
Triglycerides: 102 mg/dL (ref 0.0–149.0)
VLDL: 20.4 mg/dL (ref 0.0–40.0)

## 2024-03-02 LAB — HEMOGLOBIN A1C: Hgb A1c MFr Bld: 5.5 % (ref 4.6–6.5)

## 2024-03-02 LAB — MICROALBUMIN / CREATININE URINE RATIO
Creatinine,U: 80.1 mg/dL
Microalb Creat Ratio: 9.6 mg/g (ref 0.0–30.0)
Microalb, Ur: 0.8 mg/dL (ref 0.0–1.9)

## 2024-03-02 LAB — TSH: TSH: 1.33 u[IU]/mL (ref 0.35–5.50)

## 2024-03-02 LAB — IRON,TIBC AND FERRITIN PANEL
%SAT: 19 % (ref 16–45)
Ferritin: 8 ng/mL — ABNORMAL LOW (ref 16–232)
Iron: 76 ug/dL (ref 45–160)
TIBC: 399 ug/dL (ref 250–450)

## 2024-03-02 LAB — LDL CHOLESTEROL, DIRECT: Direct LDL: 75 mg/dL

## 2024-03-06 ENCOUNTER — Ambulatory Visit: Payer: Self-pay | Admitting: Internal Medicine

## 2024-03-09 ENCOUNTER — Encounter: Payer: Self-pay | Admitting: Internal Medicine

## 2024-03-29 ENCOUNTER — Encounter: Payer: Self-pay | Admitting: Obstetrics and Gynecology

## 2024-03-29 ENCOUNTER — Ambulatory Visit: Admitting: Obstetrics and Gynecology

## 2024-03-29 VITALS — BP 100/69 | HR 83 | Ht 62.0 in | Wt 146.0 lb

## 2024-03-29 DIAGNOSIS — N946 Dysmenorrhea, unspecified: Secondary | ICD-10-CM | POA: Diagnosis not present

## 2024-03-29 DIAGNOSIS — N952 Postmenopausal atrophic vaginitis: Secondary | ICD-10-CM

## 2024-03-29 DIAGNOSIS — N941 Unspecified dyspareunia: Secondary | ICD-10-CM

## 2024-03-29 MED ORDER — INTRAROSA 6.5 MG VA INST: 6.5000 mg | VAGINAL_INSERT | Freq: Every day | VAGINAL | 11 refills | Status: AC

## 2024-03-29 NOTE — Progress Notes (Signed)
 Marylynn Verneita CROME, MD   Chief Complaint  Patient presents with   IUD insertion    HPI:      [name] is a 53 y.o. [id number] whose LMP was Patient's last menstrual period was 03/12/2024 (approximate)., presents today for dysmenorrhea. Pt seen 01/25/24 for her annual; was on POPs after mirena IUD removed 9/24 with light periods for 3-4 days, no BTB, no dysmen. Had pt stop POPs due to hx of lobular CIS and oncology would prefer pt not be on hormones. Pt has had 2 periods since stopping POPs, about a month apart, lasting 3-4 days, mod flow, no BTB, but with bad dysmen last month. Can't take NSAIDs. Had minimal relief with tylenol , didn't try heating pad. Wanted Mirena IUD for period relief but can't have hormones.   Pt also with vaginal dryness/dyspareunia at 6/25 appt. Tried testosterone crm ext topically but burned per pt. Just using lubricants now and doing ok.   Patient Active Problem List   Diagnosis Date Noted   Atrophic vaginitis 03/01/2024   Lack of libido 03/01/2024   Right ovarian cyst 10/13/2023   Leukocytosis 04/02/2023   Spondylolisthesis at L4-L5 level 04/02/2023   Prediabetes 10/12/2022   Hypertriglyceridemia 10/12/2022   Diuretic-induced hypokalemia 03/26/2022   Increased risk of breast cancer 12/12/2021   Family history of breast cancer 12/12/2021   Vitamin D  deficiency 09/01/2021   Personal history of breast cancer 11/05/2020   Urinary urgency 12/14/2019   S/P laparoscopic sleeve gastrectomy 08/09/2019   Hypertension 05/12/2019   Loud snoring 03/05/2019   Sciatica of right side associated with disorder of lumbosacral spine 02/26/2018   Obesity (BMI 30.0-34.9) 02/26/2018   Family history of ovarian cancer 10/20/2017   Chronic pain of right ankle 06/25/2017   Anxiety and depression 07/17/2015   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 04/13/2014   Nephrolithiasis 11/10/2007    Past Surgical History:  Procedure Laterality Date    BREAST BIOPSY Right 02/02/2013   us  bx/clip-neg   BREAST BIOPSY Right 02/19/2017   affirm, ATYPICAL LOBULAR HYPERPLASIA   BREAST BIOPSY Right 03/04/2017   Procedure: BREAST BIOPSY WITH NEEDLE LOCALIZATION;  Surgeon: Dessa Reyes ORN, MD;  Location: [facility] ORS;  Service: General;  Laterality: Right;   BREAST LUMPECTOMY Right 2018   CESAREAN SECTION  2005 2015   CHOLECYSTECTOMY  09/2012   CYSTOSCOPY W/ RETROGRADES N/A 02/02/2017   Procedure: CYSTOSCOPY WITH RETROGRADE PYELOGRAM;  Surgeon: Watt Rush, MD;  Location: [facility] OR[facility];  Service: Urology;  Laterality: N/A;   CYSTOSCOPY/URETEROSCOPY/HOLMIUM LASER/STENT PLACEMENT Left 07/10/2020   Procedure: CYSTOSCOPY/URETEROSCOPY/HOLMIUM LASER/STENT PLACEMENT;  Surgeon: Twylla Glendia BROCKS, MD;  Location: [facility] OR[facility];  Service: Urology;  Laterality: Left;   EXTRACORPOREAL SHOCK WAVE LITHOTRIPSY  2009   INTRAUTERINE DEVICE INSERTION  MARCH 2012   MURINA   LAPAROSCOPIC GASTRIC SLEEVE RESECTION N/A 08/09/2019   Procedure: LAPAROSCOPIC GASTRIC SLEEVE RESECTION, Upper Endo, ERAS [place];  Surgeon: Gladis Cough, MD;  Location: [facility];  Service: General;  Laterality: N/A;   URETERAL STENT PLACEMENT  1998  (APPROX)   Kidney Stone   URETEROSCOPY WITH HOLMIUM LASER LITHOTRIPSY Left 02/02/2017   Procedure: URETEROSCOPY WITH HOLMIUM LASER LITHOTRIPSY;  Surgeon: Watt Rush, MD;  Location: [facility] OR[facility];  Service: Urology;  Laterality: Left;    Family History  Problem Relation Age of Onset   Other Mother        breast cyst   Breast cancer Mother 7  tested   Lung cancer Father    Skin cancer Father    Lymphoma Father    Breast cancer Maternal Aunt 39       Great Aunt, late 48s   Alcohol abuse Maternal Uncle    Alcohol abuse Paternal Uncle    Ovarian cancer Maternal Grandmother 65   Lymphoma Maternal Grandfather        cancer   Alcohol abuse Paternal Grandfather     Social History   Socioeconomic History   Marital status: Married    Spouse name:  Not on file   Number of children: 1   Years of education: Not on file   Highest education level: Bachelor's degree (e.g., BA, AB, BS)  Occupational History   Occupation: Special ED Teacher    Comment: [employer]  Tobacco Use   Smoking status: Former    Current packs/day: 0.00    Average packs/day: 0.5 packs/day for 16.0 years (8.0 ttl pk-yrs)    Types: Cigarettes    Start date: 06/25/1998    Quit date: 06/25/2014    Years since quitting: 9.7   Smokeless tobacco: Never  Vaping Use   Vaping status: Never Used  Substance and Sexual Activity   Alcohol use: Yes    Comment: very rare   Drug use: No   Sexual activity: Yes    Birth control/protection: Pill  Other Topics Concern   Not on file  Social History Narrative   No regular exercise.      Nutrition: 3 meals a day, limited fruits and vegetables, fast meals occasionally      Cell: [phone]      Married   Technical brewer education   2 children    Caffeine- 1-2 cups of coke daily   Social Drivers of Health   Financial Resource Strain: Low Risk  (02/29/2024)   Overall Financial Resource Strain (CARDIA)    Difficulty of Paying Living Expenses: Not very hard  Food Insecurity: No Food Insecurity (02/29/2024)   Hunger Vital Sign    Worried About Running Out of Food in the Last Year: Never true    Ran Out of Food in the Last Year: Never true  Transportation Needs: No Transportation Needs (02/29/2024)   PRAPARE - Administrator, Civil Service (Medical): No    Lack of Transportation (Non-Medical): No  Physical Activity: Inactive (02/29/2024)   Exercise Vital Sign    Days of Exercise per Week: 0 days    Minutes of Exercise per Session: Not on file  Stress: Stress Concern Present (02/29/2024)   [employer] of Occupational Health - Occupational Stress Questionnaire    Feeling of Stress: Rather much  Social Connections: Moderately Integrated (02/29/2024)   Social Connection and Isolation Panel     Frequency of Communication with Friends and Family: More than three times a week    Frequency of Social Gatherings with Friends and Family: Not on file    Attends Religious Services: 1 to 4 times per year    Active Member of [employer] or Organizations: No    Attends Engineer, structural: Not on file    Marital Status: Married  Catering manager Violence: Not on file    Outpatient Medications Prior to Visit  Medication Sig Dispense Refill   AMBULATORY NON FORMULARY MEDICATION Medication Name: Testosterone 1 mg/ml topical cream Apply 1 ml pump to labia daily, avoid transference 30 mL 2   buPROPion  (WELLBUTRIN  XL) 300 MG 24 hr  tablet TAKE 1 TABLET(300 MG) BY MOUTH DAILY 90 tablet 1   gabapentin  (NEURONTIN ) 100 MG capsule 2 po qHS     hydrochlorothiazide  (HYDRODIURIL ) 25 MG tablet TAKE 1 TABLET(25 MG) BY MOUTH DAILY 90 tablet 1   PARoxetine  (PAXIL ) 20 MG tablet Take 1 tablet (20 mg total) by mouth daily. 90 tablet 1   Facility-Administered Medications Prior to Visit  Medication Dose Route Frequency Provider Last Rate Last Admin   cyclobenzaprine  (FLEXERIL ) tablet 5 mg  5 mg Oral TID PRN           ROS:  Review of Systems  Constitutional:  Negative for fever.  Gastrointestinal:  Negative for blood in stool, constipation, diarrhea, nausea and vomiting.  Genitourinary:  Negative for dyspareunia, dysuria, flank pain, frequency, hematuria, urgency, vaginal bleeding, vaginal discharge and vaginal pain.  Musculoskeletal:  Negative for back pain.  Skin:  Negative for rash.   BREAST: No symptoms   OBJECTIVE:   Vitals:  BP 100/69   Pulse 83   Ht 5' 2 (1.575 m)   Wt 146 lb (66.2 kg)   LMP 03/12/2024 (Approximate)   BMI 26.70 kg/m   Physical Exam Vitals reviewed.  Constitutional:      Appearance: She is well-developed.  Pulmonary:     Effort: Pulmonary effort is normal.  Musculoskeletal:        General: Normal range of motion.     Cervical back: Normal range of motion.   Skin:    General: Skin is warm and dry.  Neurological:     General: No focal deficit present.     Mental Status: She is alert and oriented to person, place, and time.     Cranial Nerves: No cranial nerve deficit.  Psychiatric:        Mood and Affect: Mood normal.        Behavior: Behavior normal.        Thought Content: Thought content normal.        Judgment: Judgment normal.    Assessment/Plan: Dysmenorrhea--discussed only IUD option is paragard given hx of lobular CIS and won't help cramps. Pt to try midol  (can't have NSAIDs) and heating pad. If sx persist, can try flexeril  prn. F/u prn.   Dyspareunia in female - Plan: Prasterone  (INTRAROSA ) 6.5 MG INST; discussed intrarosa  with Dr Melanee via secure chat and she is fine if pt tries this. Discussed risks vs benefits. Pt still having menses and estrogen. Rx eRxd to mail order, use at bedtime, cont lubricants, f/u prn.   Vaginal atrophy - Plan: Prasterone  (INTRAROSA ) 6.5 MG INST   Meds ordered this encounter  Medications   Prasterone  (INTRAROSA ) 6.5 MG INST    Sig: Place 6.5 mg vaginally at bedtime.    Dispense:  30 each    Refill:  11    Supervising Provider:   LEIGH SOBER [[id number]]      Return if symptoms worsen or fail to improve.  Ariana Mall B. Oron Westrup, PA-C 03/29/2024 3:55 PM

## 2024-03-29 NOTE — Patient Instructions (Addendum)
 I value your feedback and you entrusting [place] with your care. If you get a [place] patient survey, I would appreciate you taking the time to let us know about your experience today. Thank you! ? ? ?

## 2024-04-19 ENCOUNTER — Ambulatory Visit
Admission: RE | Admit: 2024-04-19 | Discharge: 2024-04-19 | Disposition: A | Discharge status: Home / Self Care | Source: Ambulatory Visit | Attending: Obstetrics and Gynecology | Admitting: Obstetrics and Gynecology

## 2024-04-19 DIAGNOSIS — Z803 Family history of malignant neoplasm of breast: Secondary | ICD-10-CM | POA: Diagnosis present

## 2024-04-19 DIAGNOSIS — Z1231 Encounter for screening mammogram for malignant neoplasm of breast: Secondary | ICD-10-CM | POA: Diagnosis present

## 2024-04-19 DIAGNOSIS — D0501 Lobular carcinoma in situ of right breast: Secondary | ICD-10-CM | POA: Insufficient documentation

## 2024-04-24 ENCOUNTER — Ambulatory Visit: Payer: Self-pay | Admitting: Obstetrics and Gynecology

## 2024-06-09 ENCOUNTER — Other Ambulatory Visit: Payer: Self-pay | Admitting: Internal Medicine
